# Patient Record
Sex: Male | Born: 1955 | Race: Black or African American | Hispanic: No | Marital: Married | Smoking: Former smoker
Health system: Southern US, Community
[De-identification: ages and names within clinical notes are randomized; demographics above are authoritative.]

## PROBLEM LIST (undated history)

## (undated) DIAGNOSIS — I10 Essential (primary) hypertension: Secondary | ICD-10-CM

## (undated) DIAGNOSIS — K219 Gastro-esophageal reflux disease without esophagitis: Secondary | ICD-10-CM

## (undated) HISTORY — PX: JOINT REPLACEMENT: SHX530

## (undated) HISTORY — PX: TONSILLECTOMY: SUR1361

## (undated) HISTORY — PX: KNEE SURGERY: SHX244

## (undated) HISTORY — PX: HERNIA REPAIR: SHX51

## (undated) HISTORY — PX: TONSILECTOMY, ADENOIDECTOMY, BILATERAL MYRINGOTOMY AND TUBES: SHX2538

---

## 1977-12-02 HISTORY — PX: NECK SURGERY: SHX720

## 2006-12-02 LAB — HM COLONOSCOPY: HM Colonoscopy: NORMAL

## 2007-01-29 ENCOUNTER — Ambulatory Visit: Payer: Self-pay | Admitting: Gastroenterology

## 2008-04-25 ENCOUNTER — Ambulatory Visit: Payer: Self-pay | Admitting: General Practice

## 2008-05-04 ENCOUNTER — Inpatient Hospital Stay: Payer: Self-pay | Admitting: General Practice

## 2008-06-28 ENCOUNTER — Ambulatory Visit: Payer: Self-pay | Admitting: General Practice

## 2008-07-05 ENCOUNTER — Inpatient Hospital Stay: Payer: Self-pay | Admitting: General Practice

## 2014-02-05 ENCOUNTER — Emergency Department: Payer: Self-pay | Admitting: Emergency Medicine

## 2014-07-07 LAB — LIPID PANEL
Cholesterol: 120 mg/dL (ref 0–200)
HDL: 31 mg/dL — AB (ref 35–70)
LDL Cholesterol: 66 mg/dL
Triglycerides: 113 mg/dL (ref 40–160)

## 2014-07-07 LAB — HEMOGLOBIN A1C: Hgb A1c MFr Bld: 6 % (ref 4.0–6.0)

## 2015-03-20 DIAGNOSIS — Z8619 Personal history of other infectious and parasitic diseases: Secondary | ICD-10-CM | POA: Insufficient documentation

## 2015-05-14 ENCOUNTER — Encounter: Payer: Self-pay | Admitting: Family Medicine

## 2015-05-14 DIAGNOSIS — Z6841 Body Mass Index (BMI) 40.0 and over, adult: Secondary | ICD-10-CM

## 2015-05-14 DIAGNOSIS — E785 Hyperlipidemia, unspecified: Secondary | ICD-10-CM | POA: Insufficient documentation

## 2015-05-14 DIAGNOSIS — J302 Other seasonal allergic rhinitis: Secondary | ICD-10-CM | POA: Insufficient documentation

## 2015-05-14 DIAGNOSIS — E8881 Metabolic syndrome: Secondary | ICD-10-CM | POA: Insufficient documentation

## 2015-05-14 DIAGNOSIS — Z96653 Presence of artificial knee joint, bilateral: Secondary | ICD-10-CM | POA: Insufficient documentation

## 2015-05-14 DIAGNOSIS — G4733 Obstructive sleep apnea (adult) (pediatric): Secondary | ICD-10-CM | POA: Insufficient documentation

## 2015-05-14 DIAGNOSIS — I10 Essential (primary) hypertension: Secondary | ICD-10-CM | POA: Insufficient documentation

## 2015-05-14 DIAGNOSIS — N401 Enlarged prostate with lower urinary tract symptoms: Secondary | ICD-10-CM | POA: Insufficient documentation

## 2015-05-14 DIAGNOSIS — N521 Erectile dysfunction due to diseases classified elsewhere: Secondary | ICD-10-CM | POA: Insufficient documentation

## 2015-05-14 DIAGNOSIS — K219 Gastro-esophageal reflux disease without esophagitis: Secondary | ICD-10-CM | POA: Insufficient documentation

## 2015-05-14 DIAGNOSIS — J3089 Other allergic rhinitis: Secondary | ICD-10-CM | POA: Insufficient documentation

## 2015-05-16 ENCOUNTER — Ambulatory Visit (INDEPENDENT_AMBULATORY_CARE_PROVIDER_SITE_OTHER): Payer: Managed Care, Other (non HMO) | Admitting: Family Medicine

## 2015-05-16 ENCOUNTER — Encounter: Payer: Self-pay | Admitting: Family Medicine

## 2015-05-16 VITALS — BP 120/80 | HR 79 | Temp 98.0°F | Resp 16 | Ht 72.0 in | Wt 286.4 lb

## 2015-05-16 DIAGNOSIS — I10 Essential (primary) hypertension: Secondary | ICD-10-CM | POA: Diagnosis not present

## 2015-05-16 DIAGNOSIS — E785 Hyperlipidemia, unspecified: Secondary | ICD-10-CM | POA: Diagnosis not present

## 2015-05-16 DIAGNOSIS — Z8619 Personal history of other infectious and parasitic diseases: Secondary | ICD-10-CM

## 2015-05-16 DIAGNOSIS — F4321 Adjustment disorder with depressed mood: Secondary | ICD-10-CM | POA: Insufficient documentation

## 2015-05-16 DIAGNOSIS — I8391 Asymptomatic varicose veins of right lower extremity: Secondary | ICD-10-CM | POA: Insufficient documentation

## 2015-05-16 MED ORDER — LORAZEPAM 0.5 MG PO TABS: 0.5000 mg | ORAL_TABLET | Freq: Two times a day (BID) | ORAL | Status: DC | PRN

## 2015-05-16 NOTE — Addendum Note (Signed)
Addended by: Cynda Familia on: 05/16/2015 11:18 AM   Modules accepted: Medications

## 2015-05-16 NOTE — Progress Notes (Signed)
Name: Shaun Patrick.   MRN: [id number]    DOB: Jun 24, 1956   Date:05/16/2015       Progress Note  Subjective  Chief Complaint  Chief Complaint  Patient presents with  . Hypertension  . Hyperlipidemia    some leg cramps at night  . Allergic Rhinitis     HPI  HTN: taking medication and denies side effects, bp is at goal. No chest pain , no SOB, no swelling - wears stockings because of varicose vein.  Hyperlipidemia: recent labs done 03/2015 at Urgent Care - HDL was low at 31, LDL at goal 66. Taking Crestor and is doing well.  AR: he can't tolerate nasal steroids, no sneezing, no cough, no sob, no rhinorrhea, occasionally nasal congestion, she has been off medications.    GERD: taking lansoprazole daily and states not as effective as the brand name. He has bloating or heartburn , but usually when he eats late at night or when he splurges on spicy food  Grieving: lost his mother one year ago, and is still grieving. She died 02-09-14. He states he was mama's boy. He also states he lost touch with his sisters because of insurance money.  He was given Lorazepam by Urgent Care but he never started to take it.  ED: he can't afford Stendra anymore, he states medication worked well, but too expensive but he will try to get it.    Patient Active Problem List   Diagnosis Date Noted  . Allergic rhinitis, seasonal 05/14/2015  . Benign hypertension 05/14/2015  . Benign prostatic hypertrophy with lower urinary tract symptoms (LUTS) 05/14/2015  . Dyslipidemia 05/14/2015  . ED (erectile dysfunction) of organic origin 05/14/2015  . Gastro-esophageal reflux disease without esophagitis 05/14/2015  . Dysmetabolic syndrome 05/14/2015  . Extreme obesity 05/14/2015  . Obstructive apnea 05/14/2015  . History of bilateral knee replacement 05/14/2015    Past Surgical History  Procedure Laterality Date  . Knee surgery Bilateral     knee replacements  . Hernia repair      umbilical  . Neck  surgery  1979    broke neck  . Tonsilectomy, adenoidectomy, bilateral myringotomy and tubes      Family History  Problem Relation Age of Onset  . Stroke Mother   . Heart disease Mother   . Hypertension Mother   . Liver disease Father     History   Social History  . Marital Status: Married    Spouse Name: N/A  . Number of Children: N/A  . Years of Education: N/A   Occupational History  . Not on file.   Social History Main Topics  . Smoking status: Former Smoker -- 10 years    Quit date: 12/02/1993  . Smokeless tobacco: Never Used  . Alcohol Use: 0.0 oz/week    0 Standard drinks or equivalent per week     Comment: occasionally  . Drug Use: No  . Sexual Activity: No   Other Topics Concern  . Not on file   Social History Narrative  . No narrative on file     Current outpatient prescriptions:  .  amLODipine-benazepril (LOTREL) 10-40 MG per capsule, Take 1 capsule by mouth daily., Disp: , Rfl:  .  aspirin EC 81 MG tablet, Take 1 tablet by mouth daily., Disp: , Rfl:  .  CRESTOR 20 MG tablet, Take 1 tablet by mouth daily., Disp: , Rfl:  .  lansoprazole (PREVACID) 30 MG capsule, Take 1 capsule by mouth daily.,  Disp: , Rfl:  .  montelukast (SINGULAIR) 10 MG tablet, Take 1 tablet by mouth as needed., Disp: , Rfl:  .  Multiple Vitamin (MULTI-VITAMINS) TABS, Take 1 tablet by mouth 1 day or 1 dose., Disp: , Rfl:  .  Saw Palmetto 160 MG CAPS, Take 1 capsule by mouth 1 day or 1 dose., Disp: , Rfl:   Allergies  Allergen Reactions  . Flomax  [Tamsulosin Hcl]     dizziness     ROS  Constitutional: Negative for fever he lost 8 lbs since last visit.  Respiratory: Negative for cough and shortness of breath.   Cardiovascular: Negative for chest pain or palpitations.  Gastrointestinal: Negative for abdominal pain, no bowel changes.  Musculoskeletal: Negative for gait problem or joint swelling.  Skin: Negative for rash.  Neurological: Negative for dizziness or headache.  No  other specific complaints in a complete review of systems (except as listed in HPI above).  Objective  Filed Vitals:   05/16/15 1029  BP: 120/80  Pulse: 79  Temp: 98 F (36.7 C)  TempSrc: Oral  Resp: 16  Height: 6' (1.829 m)  Weight: 286 lb 6.4 oz (129.91 kg)  SpO2: 98%    Body mass index is 38.83 kg/(m^2).  Physical Exam  Constitutional: Patient appears well-developed and well-nourished. No distress.  HENT: Head: Normocephalic and atraumatic. Ears: B TMs ok, no erythema or effusion; Nose: Nose normal. Mouth/Throat: Oropharynx is clear and moist. No oropharyngeal exudate.  Eyes: Conjunctivae and EOM are normal. Pupils are equal, round, and reactive to light. No scleral icterus.  Neck: Normal range of motion. Neck supple. No JVD present. No thyromegaly present.  Cardiovascular: Normal rate, regular rhythm and normal heart sounds.  No murmur heard. No BLE edema., varicose veins on right lower leg Pulmonary/Chest: Effort normal and breath sounds normal. No respiratory distress. Abdominal: Soft. There is no tenderness. no masses Musculoskeletal: Normal range of motion, no joint effusions. S/p bilateral knee replacement, no effusion Neurological: he is alert and oriented to person, place, and time. No cranial nerve deficit. Coordination, balance, strength, speech and gait are normal.  Skin: Skin is warm and dry. No rash noted. No erythema.  Psychiatric: Patient has a normal mood and affect. behavior is normal. Judgment and thought content normal.  PHQ2/9: Depression screen PHQ 2/9 05/16/2015  Decreased Interest 0  Down, Depressed, Hopeless 0  PHQ - 2 Score 0      Fall Risk: Fall Risk  05/16/2015  Falls in the past year? No      Assessment & Plan   1. Benign hypertension Continue medication  2. History of shingles Hold on shingles vaccine for 2019  3. Varicose veins of right lower extremity Continue compress stocking hoses  4. Dyslipidemia Continue crestor  5.  Dysfunctional grieving He will try prn lorazepam

## 2015-05-16 NOTE — Patient Instructions (Signed)
Lorazepam tablets What is this medicine? LORAZEPAM (lor A ze pam) is a benzodiazepine. It is used to treat anxiety. This medicine may be used for other purposes; ask your health care provider or pharmacist if you have questions. COMMON BRAND NAME(S): Ativan What should I tell my health care provider before I take this medicine? They need to know if you have any of these conditions: -alcohol or drug abuse problem -bipolar disorder, depression, psychosis or other mental health condition -glaucoma -kidney or liver disease -lung disease or breathing difficulties -myasthenia gravis -Parkinson's disease -seizures or a history of seizures -suicidal thoughts -an unusual or allergic reaction to lorazepam, other benzodiazepines, foods, dyes, or preservatives -pregnant or trying to get pregnant -breast-feeding How should I use this medicine? Take this medicine by mouth with a glass of water. Follow the directions on the prescription label. If it upsets your stomach, take it with food or milk. Take your medicine at regular intervals. Do not take it more often than directed. Do not stop taking except on the advice of your doctor or health care professional. Talk to your pediatrician regarding the use of this medicine in children. Special care may be needed. Overdosage: If you think you have taken too much of this medicine contact a poison control center or emergency room at once. NOTE: This medicine is only for you. Do not share this medicine with others. What if I miss a dose? If you miss a dose, take it as soon as you can. If it is almost time for your next dose, take only that dose. Do not take double or extra doses. What may interact with this medicine? -barbiturate medicines for inducing sleep or treating seizures, like phenobarbital -clozapine -medicines for depression, mental problems or psychiatric disturbances -medicines for sleep -phenytoin -probenecid -theophylline -valproic  acid This list may not describe all possible interactions. Give your health care provider a list of all the medicines, herbs, non-prescription drugs, or dietary supplements you use. Also tell them if you smoke, drink alcohol, or use illegal drugs. Some items may interact with your medicine. What should I watch for while using this medicine? Visit your doctor or health care professional for regular checks on your progress. Your body may become dependent on this medicine, ask your doctor or health care professional if you still need to take it. However, if you have been taking this medicine regularly for some time, do not suddenly stop taking it. You must gradually reduce the dose or you may get severe side effects. Ask your doctor or health care professional for advice before increasing or decreasing the dose. Even after you stop taking this medicine it can still affect your body for several days. You may get drowsy or dizzy. Do not drive, use machinery, or do anything that needs mental alertness until you know how this medicine affects you. To reduce the risk of dizzy and fainting spells, do not stand or sit up quickly, especially if you are an older patient. Alcohol may increase dizziness and drowsiness. Avoid alcoholic drinks. Do not treat yourself for coughs, colds or allergies without asking your doctor or health care professional for advice. Some ingredients can increase possible side effects. What side effects may I notice from receiving this medicine? Side effects that you should report to your doctor or health care professional as soon as possible: -changes in vision -confusion -depression -mood changes, excitability or aggressive behavior -movement difficulty, staggering or jerky movements -muscle cramps -restlessness -weakness or tiredness Side effects that   usually do not require medical attention (report to your doctor or health care professional if they continue or are  bothersome): -constipation or diarrhea -difficulty sleeping, nightmares -dizziness, drowsiness -headache -nausea, vomiting This list may not describe all possible side effects. Call your doctor for medical advice about side effects. You may report side effects to FDA at 1-800-FDA-1088. Where should I keep my medicine? Keep out of the reach of children. This medicine can be abused. Keep your medicine in a safe place to protect it from theft. Do not share this medicine with anyone. Selling or giving away this medicine is dangerous and against the law. Store at room temperature between 20 and 25 degrees C (68 and 77 degrees F). Protect from light. Keep container tightly closed. Throw away any unused medicine after the expiration date. NOTE: This sheet is a summary. It may not cover all possible information. If you have questions about this medicine, talk to your doctor, pharmacist, or health care provider.  2015, Elsevier/[employer] Standard. (2008-05-20 14:58:20)  

## 2015-07-23 ENCOUNTER — Other Ambulatory Visit: Payer: Self-pay | Admitting: Family Medicine

## 2015-08-12 ENCOUNTER — Other Ambulatory Visit: Payer: Self-pay | Admitting: Family Medicine

## 2015-08-16 ENCOUNTER — Ambulatory Visit (INDEPENDENT_AMBULATORY_CARE_PROVIDER_SITE_OTHER): Payer: Managed Care, Other (non HMO) | Admitting: Family Medicine

## 2015-08-16 ENCOUNTER — Encounter: Payer: Self-pay | Admitting: Family Medicine

## 2015-08-16 VITALS — BP 110/68 | HR 91 | Temp 98.3°F | Resp 16 | Ht 72.0 in | Wt 284.4 lb

## 2015-08-16 DIAGNOSIS — I10 Essential (primary) hypertension: Secondary | ICD-10-CM | POA: Diagnosis not present

## 2015-08-16 DIAGNOSIS — N529 Male erectile dysfunction, unspecified: Secondary | ICD-10-CM

## 2015-08-16 DIAGNOSIS — E785 Hyperlipidemia, unspecified: Secondary | ICD-10-CM

## 2015-08-16 DIAGNOSIS — J302 Other seasonal allergic rhinitis: Secondary | ICD-10-CM

## 2015-08-16 DIAGNOSIS — J309 Allergic rhinitis, unspecified: Secondary | ICD-10-CM | POA: Diagnosis not present

## 2015-08-16 DIAGNOSIS — N528 Other male erectile dysfunction: Secondary | ICD-10-CM

## 2015-08-16 DIAGNOSIS — E668 Other obesity: Secondary | ICD-10-CM

## 2015-08-16 DIAGNOSIS — Z125 Encounter for screening for malignant neoplasm of prostate: Secondary | ICD-10-CM | POA: Diagnosis not present

## 2015-08-16 DIAGNOSIS — E8881 Metabolic syndrome: Secondary | ICD-10-CM | POA: Diagnosis not present

## 2015-08-16 DIAGNOSIS — K219 Gastro-esophageal reflux disease without esophagitis: Secondary | ICD-10-CM

## 2015-08-16 DIAGNOSIS — Z23 Encounter for immunization: Secondary | ICD-10-CM | POA: Diagnosis not present

## 2015-08-16 DIAGNOSIS — J3089 Other allergic rhinitis: Secondary | ICD-10-CM

## 2015-08-16 DIAGNOSIS — G4733 Obstructive sleep apnea (adult) (pediatric): Secondary | ICD-10-CM | POA: Diagnosis not present

## 2015-08-16 MED ORDER — LEVOCETIRIZINE DIHYDROCHLORIDE 5 MG PO TABS: ORAL_TABLET | ORAL | Status: DC

## 2015-08-16 MED ORDER — FLUTICASONE PROPIONATE 50 MCG/ACT NA SUSP: 2.0000 | Freq: Every day | NASAL | Status: DC

## 2015-08-16 MED ORDER — MONTELUKAST SODIUM 10 MG PO TABS: ORAL_TABLET | ORAL | Status: DC

## 2015-08-16 MED ORDER — OMEPRAZOLE 40 MG PO CPDR: 40.0000 mg | DELAYED_RELEASE_CAPSULE | Freq: Every day | ORAL | Status: DC

## 2015-08-16 MED ORDER — SILDENAFIL CITRATE 20 MG PO TABS: 40.0000 mg | ORAL_TABLET | Freq: Every day | ORAL | Status: DC | PRN

## 2015-08-16 MED ORDER — AMLODIPINE BESY-BENAZEPRIL HCL 10-40 MG PO CAPS: ORAL_CAPSULE | ORAL | Status: DC

## 2015-08-16 MED ORDER — CRESTOR 20 MG PO TABS: 20.0000 mg | ORAL_TABLET | Freq: Every evening | ORAL | Status: DC

## 2015-08-16 NOTE — Progress Notes (Signed)
Name: Shaun Patrick.   MRN: [id number]    DOB: 20-Sep-1956   Date:08/16/2015       Progress Note  Subjective  Chief Complaint  Chief Complaint  Patient presents with  . Medication Refill    3 month F/U  . Hypertension  . Allergic Rhinitis     [place]or[place] with seasonal changes and having URI, Nasal Congestion , Stuffiness, Right Ear Pressure  . Gastrophageal Reflux    Worst, having burning and has to take OTC with prescription medication.  . Hyperlipidemia    Muscle Cramps Occasionally    HPI  HTN: taking bp medication , he states occasionally gets orthostatic, but not drinking enough water. He is not checking bp at home, but advised him to check a few times weekly and if remains below 120/80 he will call us back to decrease dose of medication  AR: used to be seasonal, but over the past few months, has been to Urgent Care four times, had antibiotic prescription once, still using nasal spray, xyzal and singulair, but symptoms are not controlled, having nasal congestion daily. No rhinorrhea. No itchy eyes or nose, but he has sneezing  GERD: he noticed that generic Prevacid is no longer working for him. He has symptoms of GERD daily , described as heartburn , bloating and gurgling in stomach. He would like to try something else.   ED: he used to take other medication, unable to maintain erection, also the medication is too expensive.   Hyperlipidemia: taking medication and states has occasional muscle cramps  Anxiety: occasionally when he misses his mother, but doing better, not taking lorazepam because took it with alcohol once and made him confused.   Patient Active Problem List   Diagnosis Date Noted  . Varicose veins of right lower extremity 05/16/2015  . Dysfunctional grieving 05/16/2015  . Perennial allergic rhinitis with seasonal variation 05/14/2015  . Benign hypertension 05/14/2015  . Benign prostatic hypertrophy with lower urinary tract symptoms (LUTS) 05/14/2015  .  Dyslipidemia 05/14/2015  . ED (erectile dysfunction) of organic origin 05/14/2015  . Gastro-esophageal reflux disease without esophagitis 05/14/2015  . Dysmetabolic syndrome 05/14/2015  . Extreme obesity 05/14/2015  . Obstructive apnea 05/14/2015  . History of bilateral knee replacement 05/14/2015  . History of shingles 03/20/2015    Past Surgical History  Procedure Laterality Date  . Knee surgery Bilateral     knee replacements  . Hernia repair      umbilical  . Neck surgery  1979    broke neck  . Tonsilectomy, adenoidectomy, bilateral myringotomy and tubes      Family History  Problem Relation Age of Onset  . Stroke Mother   . Heart disease Mother   . Hypertension Mother   . Liver disease Father   . Hypertension Sister     Social History   Social History  . Marital Status: Married    Spouse Name: N/A  . Number of Children: N/A  . Years of Education: N/A   Occupational History  . Not on file.   Social History Main Topics  . Smoking status: Former Smoker -- 10 years    Types: Cigarettes    Start date: 12/03/1983    Quit date: 12/02/1993  . Smokeless tobacco: Never Used  . Alcohol Use: 0.0 oz/week    0 Standard drinks or equivalent per week     Comment: occasionally  . Drug Use: No  . Sexual Activity: No   Other Topics Concern  . Not  on file   Social History Narrative     Current outpatient prescriptions:  .  amLODipine-benazepril (LOTREL) 10-40 MG per capsule, One daily for bp, Disp: 30 capsule, Rfl: 3 .  aspirin EC 81 MG tablet, Take 1 tablet by mouth daily., Disp: , Rfl:  .  CRESTOR 20 MG tablet, Take 1 tablet (20 mg total) by mouth every evening., Disp: 30 tablet, Rfl: 3 .  fluticasone (FLONASE) 50 MCG/ACT nasal spray, Place 2 sprays into both nostrils daily., Disp: 16 g, Rfl: 3 .  levocetirizine (XYZAL) 5 MG tablet, TAKE ONE TABLET BY MOUTH ONCE DAILY FOR  ALLLERGIES, Disp: 30 tablet, Rfl: 3 .  LORazepam (ATIVAN) 0.5 MG tablet, Take 1 tablet (0.5  mg total) by mouth 2 (two) times daily as needed for anxiety., Disp: 30 tablet, Rfl: 0 .  meclizine (ANTIVERT) 25 MG tablet, , Disp: , Rfl:  .  montelukast (SINGULAIR) 10 MG tablet, One pill by mouth daily, Disp: 30 tablet, Rfl: 3 .  Multiple Vitamin (MULTI-VITAMINS) TABS, Take 1 tablet by mouth 1 day or 1 dose., Disp: , Rfl:  .  omeprazole (PRILOSEC) 40 MG capsule, Take 1 capsule (40 mg total) by mouth daily., Disp: 30 capsule, Rfl: 3 .  Saw Palmetto 160 MG CAPS, Take 1 capsule by mouth 1 day or 1 dose., Disp: , Rfl:  .  sildenafil (REVATIO) 20 MG tablet, Take 2 tablets (40 mg total) by mouth daily as needed (ED)., Disp: 40 tablet, Rfl: 2  Allergies  Allergen Reactions  . Flomax  [Tamsulosin Hcl]     dizziness  . Hydrocodone Anxiety     ROS  Constitutional: Negative for fever or significant weight change.  Respiratory: Negative for cough and shortness of breath.   Cardiovascular: Negative for chest pain or palpitations.  Gastrointestinal: Negative for abdominal pain, no bowel changes.  Musculoskeletal: Negative for gait problem or joint swelling.  Skin: Negative for rash.  Neurological: Occasional dizziness or headache.  No other specific complaints in a complete review of systems (except as listed in HPI above).   Objective  Filed Vitals:   08/16/15 1027  BP: 110/68  Pulse: 91  Temp: 98.3 F (36.8 C)  TempSrc: Oral  Resp: 16  Height: 6' (1.829 m)  Weight: 284 lb 6.4 oz (129.003 kg)  SpO2: 97%    Body mass index is 38.56 kg/(m^2).  Physical Exam  Constitutional: Patient appears well-developed and well-nourished. Obese  No distress.  HEENT: head atraumatic, normocephalic, pupils equal and reactive to light, ears TM, neck supple, throat within normal limits - s/p uvuloplasty, mild erythema, boggy turbinates Cardiovascular: Normal rate, regular rhythm and normal heart sounds.  No murmur heard. No BLE edema. Pulmonary/Chest: Effort normal and breath sounds normal. No  respiratory distress. Abdominal: Soft.  There is no tenderness. Psychiatric: Patient has a normal mood and affect. behavior is normal. Judgment and thought content normal.  PHQ2/9: Depression screen [facility] 2/9 08/16/2015 05/16/2015  Decreased Interest 0 0  Down, Depressed, Hopeless 0 0  PHQ - 2 Score 0 0    Fall Risk: Fall Risk  08/16/2015 05/16/2015  Falls in the past year? No No      Functional Status Survey: Is the patient deaf or have difficulty hearing?: No Does the patient have difficulty seeing, even when wearing glasses/contacts?: Yes (glasses) Does the patient have difficulty concentrating, remembering, or making decisions?: No Does the patient have difficulty walking or climbing stairs?: No Does the patient have difficulty dressing or bathing?: No  Does the patient have difficulty doing errands alone such as visiting a doctor's office or shopping?: No    Assessment & Plan  1. Gastro-esophageal reflux disease without esophagitis Change to Omeprazole and if no improvement refer to GI, explained importance of avoiding caffeine, not to eat before going to bed - omeprazole (PRILOSEC) 40 MG capsule; Take 1 capsule (40 mg total) by mouth daily.  Dispense: 30 capsule; Refill: 3  2. Needs flu shot  - Flu Vaccine QUAD 36+ mos PF IM (Fluarix & Fluzone Quad PF)  3. Obstructive apnea Had uvuloplasty  4. Extreme obesity Discussed diet and exercise  5. Dysmetabolic syndrome  - Hemoglobin A1c  7. ED  - sildenafil (REVATIO) 20 MG tablet; Take 2 tablets (40 mg total) by mouth daily as needed (ED).  Dispense: 40 tablet; Refill: 2 Discussed not FDA approved for ED, can drop his blood pressure, also discussed possible side effects  7. Dyslipidemia  - Lipid panel - CRESTOR 20 MG tablet; Take 1 tablet (20 mg total) by mouth every evening.  Dispense: 30 tablet; Refill: 3  8. Benign hypertension Is low, he will monitor at home, I was going to decrease dose but he wants to hold off  for now - Comprehensive metabolic panel - amLODipine-benazepril (LOTREL) 10-40 MG per capsule; One daily for bp  Dispense: 30 capsule; Refill: 3  9. Perennial allergic rhinitis with seasonal variation  - Ambulatory referral to ENT - levocetirizine (XYZAL) 5 MG tablet; TAKE ONE TABLET BY MOUTH ONCE DAILY FOR  ALLLERGIES  Dispense: 30 tablet; Refill: 3 - montelukast (SINGULAIR) 10 MG tablet; One pill by mouth daily  Dispense: 30 tablet; Refill: 3 - fluticasone (FLONASE) 50 MCG/ACT nasal spray; Place 2 sprays into both nostrils daily.  Dispense: 16 g; Refill: 3  10. Prostate cancer screening  - PSA

## 2015-08-17 LAB — COMPREHENSIVE METABOLIC PANEL
ALT: 33 IU/L (ref 0–44)
AST: 28 IU/L (ref 0–40)
Albumin/Globulin Ratio: 1.4 (ref 1.1–2.5)
Albumin: 4.3 g/dL (ref 3.5–5.5)
Alkaline Phosphatase: 90 IU/L (ref 39–117)
BUN/Creatinine Ratio: 16 (ref 9–20)
BUN: 15 mg/dL (ref 6–24)
Bilirubin Total: 0.3 mg/dL (ref 0.0–1.2)
CO2: 25 mmol/L (ref 18–29)
Calcium: 9.2 mg/dL (ref 8.7–10.2)
Chloride: 99 mmol/L (ref 97–108)
Creatinine, Ser: 0.95 mg/dL (ref 0.76–1.27)
GFR calc Af Amer: 102 mL/min/{1.73_m2} (ref >59)
GFR calc non Af Amer: 88 mL/min/{1.73_m2} (ref >59)
Globulin, Total: 3 g/dL (ref 1.5–4.5)
Glucose: 98 mg/dL (ref 65–99)
Potassium: 4.5 mmol/L (ref 3.5–5.2)
Sodium: 140 mmol/L (ref 134–144)
Total Protein: 7.3 g/dL (ref 6.0–8.5)

## 2015-08-17 LAB — PSA: Prostate Specific Ag, Serum: 2.9 ng/mL (ref 0.0–4.0)

## 2015-08-17 LAB — LIPID PANEL
Chol/HDL Ratio: 3.9 ratio units (ref 0.0–5.0)
Cholesterol, Total: 156 mg/dL (ref 100–199)
HDL: 40 mg/dL (ref >39)
LDL Calculated: 88 mg/dL (ref 0–99)
Triglycerides: 140 mg/dL (ref 0–149)
VLDL Cholesterol Cal: 28 mg/dL (ref 5–40)

## 2015-08-17 LAB — HEMOGLOBIN A1C
Est. average glucose Bld gHb Est-mCnc: 126 mg/dL
Hgb A1c MFr Bld: 6 % — ABNORMAL HIGH (ref 4.8–5.6)

## 2015-08-18 ENCOUNTER — Telehealth: Payer: Self-pay

## 2015-08-18 NOTE — Progress Notes (Signed)
Patient notified

## 2015-08-18 NOTE — Telephone Encounter (Signed)
-----   Message from Alba Cory, MD sent at 08/17/2015  8:51 PM EDT ----- Lipid panel has improved, HDL is much better Sugar , kidney and liver functions are within normal limits HgbA1C is stable, pre diabetes Normal PSA

## 2015-08-18 NOTE — Telephone Encounter (Signed)
Patient was not home, left a message for patient to return my call for lab results.

## 2015-09-18 ENCOUNTER — Telehealth: Payer: Self-pay | Admitting: Family Medicine

## 2015-09-18 ENCOUNTER — Other Ambulatory Visit: Payer: Self-pay

## 2015-09-18 NOTE — Telephone Encounter (Signed)
Patient stated that due to his job is pharmacy has changed to the [facility] on [address] in [place].

## 2015-09-18 NOTE — Telephone Encounter (Signed)
Changed his pharmacy in his chart.

## 2015-10-03 ENCOUNTER — Encounter
Admission: RE | Admit: 2015-10-03 | Discharge: 2015-10-03 | Disposition: A | Discharge status: Home / Self Care | Payer: Managed Care, Other (non HMO) | Source: Ambulatory Visit | Attending: Unknown Physician Specialty | Admitting: Unknown Physician Specialty

## 2015-10-03 DIAGNOSIS — Z01818 Encounter for other preprocedural examination: Secondary | ICD-10-CM | POA: Insufficient documentation

## 2015-10-03 HISTORY — DX: Essential (primary) hypertension: I10

## 2015-10-03 HISTORY — DX: Gastro-esophageal reflux disease without esophagitis: K21.9

## 2015-10-03 LAB — CBC
HCT: 45.8 % (ref 40.0–52.0)
Hemoglobin: 14.7 g/dL (ref 13.0–18.0)
MCH: 27.3 pg (ref 26.0–34.0)
MCHC: 32.2 g/dL (ref 32.0–36.0)
MCV: 84.8 fL (ref 80.0–100.0)
Platelets: 224 10*3/uL (ref 150–440)
RBC: 5.4 MIL/uL (ref 4.40–5.90)
RDW: 15.3 % — ABNORMAL HIGH (ref 11.5–14.5)
WBC: 8.6 10*3/uL (ref 3.8–10.6)

## 2015-10-03 NOTE — Patient Instructions (Signed)
  Your procedure is scheduled on: Tuesday 10/17/2015 Report to Day Surgery. 2ND FLOOR MEDICAL MALL ENTRANCE To find out your arrival time please call [phone] between 1PM - 3PM on Monday 10/16/2015.  Remember: Instructions that are not followed completely may result in serious medical risk, up to and including death, or upon the discretion of your surgeon and anesthesiologist your surgery may need to be rescheduled.    __X__ 1. Do not eat food or drink liquids after midnight. No gum chewing or hard candies.     __X__ 2. No Alcohol for 24 hours before or after surgery.   ____ 3. Bring all medications with you on the day of surgery if instructed.    __X__ 4. Notify your doctor if there is any change in your medical condition     (cold, fever, infections).     Do not wear jewelry, make-up, hairpins, clips or nail polish.  Do not wear lotions, powders, or perfumes. You may wear deodorant.  Do not shave 48 hours prior to surgery. Men may shave face and neck.  Do not bring valuables to the hospital.    [facility] is not responsible for any belongings or valuables.               Contacts, dentures or bridgework may not be worn into surgery.  Leave your suitcase in the car. After surgery it may be brought to your room.  For patients admitted to the hospital, discharge time is determined by your                treatment team.   Patients discharged the day of surgery will not be allowed to drive home.   Please read over the following fact sheets that you were given:   Surgical Site Infection Prevention   __X__ Take these medicines the morning of surgery with A SIP OF WATER:    1. AMLODIPINE  2. CRESTOR  3. SINGULAIR  4. OMEPRAZOLE  5.   6.  ____ Fleet Enema (as directed)   ____ Use CHG Soap as directed  ____ Use inhalers on the day of surgery  ____ Stop metformin 2 days prior to surgery    ____ Take 1/2 of usual insulin dose the night before surgery and none on the morning  of surgery.   __X__ Stop Coumadin/Plavix/aspirin on 10/10/2015  ____ Stop Anti-inflammatories on    __X__ Stop supplements until after surgery.  STOP SAW PALMETTO 7-10 DAYS PRIOR TO SURGERY  ____ Bring C-Pap to the hospital.

## 2015-10-17 ENCOUNTER — Ambulatory Visit
Admission: RE | Admit: 2015-10-17 | Discharge: 2015-10-17 | Disposition: A | Discharge status: Home / Self Care | Payer: Managed Care, Other (non HMO) | Source: Ambulatory Visit | Attending: Unknown Physician Specialty | Admitting: Unknown Physician Specialty

## 2015-10-17 ENCOUNTER — Encounter
Admission: RE | Disposition: A | Discharge status: Home / Self Care | Payer: Self-pay | Source: Ambulatory Visit | Attending: Unknown Physician Specialty

## 2015-10-17 ENCOUNTER — Encounter: Payer: Self-pay | Admitting: *Deleted

## 2015-10-17 ENCOUNTER — Ambulatory Visit: Payer: Managed Care, Other (non HMO) | Admitting: Anesthesiology

## 2015-10-17 DIAGNOSIS — Z87891 Personal history of nicotine dependence: Secondary | ICD-10-CM | POA: Diagnosis not present

## 2015-10-17 DIAGNOSIS — I10 Essential (primary) hypertension: Secondary | ICD-10-CM | POA: Diagnosis not present

## 2015-10-17 DIAGNOSIS — J343 Hypertrophy of nasal turbinates: Secondary | ICD-10-CM | POA: Diagnosis not present

## 2015-10-17 DIAGNOSIS — Z79899 Other long term (current) drug therapy: Secondary | ICD-10-CM | POA: Insufficient documentation

## 2015-10-17 DIAGNOSIS — Z96659 Presence of unspecified artificial knee joint: Secondary | ICD-10-CM | POA: Insufficient documentation

## 2015-10-17 DIAGNOSIS — I739 Peripheral vascular disease, unspecified: Secondary | ICD-10-CM | POA: Insufficient documentation

## 2015-10-17 DIAGNOSIS — Z888 Allergy status to other drugs, medicaments and biological substances status: Secondary | ICD-10-CM | POA: Diagnosis not present

## 2015-10-17 DIAGNOSIS — Z885 Allergy status to narcotic agent status: Secondary | ICD-10-CM | POA: Diagnosis not present

## 2015-10-17 DIAGNOSIS — J342 Deviated nasal septum: Secondary | ICD-10-CM | POA: Insufficient documentation

## 2015-10-17 DIAGNOSIS — G4733 Obstructive sleep apnea (adult) (pediatric): Secondary | ICD-10-CM | POA: Insufficient documentation

## 2015-10-17 DIAGNOSIS — K219 Gastro-esophageal reflux disease without esophagitis: Secondary | ICD-10-CM | POA: Insufficient documentation

## 2015-10-17 DIAGNOSIS — Z8379 Family history of other diseases of the digestive system: Secondary | ICD-10-CM | POA: Insufficient documentation

## 2015-10-17 DIAGNOSIS — E785 Hyperlipidemia, unspecified: Secondary | ICD-10-CM | POA: Diagnosis not present

## 2015-10-17 DIAGNOSIS — J3489 Other specified disorders of nose and nasal sinuses: Secondary | ICD-10-CM | POA: Insufficient documentation

## 2015-10-17 DIAGNOSIS — Z823 Family history of stroke: Secondary | ICD-10-CM | POA: Insufficient documentation

## 2015-10-17 HISTORY — PX: NASAL SEPTOPLASTY W/ TURBINOPLASTY: SHX2070

## 2015-10-17 SURGERY — SEPTOPLASTY, NOSE, WITH NASAL TURBINATE REDUCTION
Anesthesia: General | Laterality: Bilateral | Wound class: Clean Contaminated

## 2015-10-17 MED ORDER — BACITRACIN ZINC 500 UNIT/GM EX OINT
TOPICAL_OINTMENT | CUTANEOUS | Status: AC
  Filled 2015-10-17: qty 28.35

## 2015-10-17 MED ORDER — PHENYLEPHRINE HCL 10 % OP SOLN
Freq: Once | OPHTHALMIC | Status: DC
  Filled 2015-10-17: qty 10

## 2015-10-17 MED ORDER — LIDOCAINE-EPINEPHRINE 1 %-1:100000 IJ SOLN
INTRAMUSCULAR | Status: DC | PRN
  Administered 2015-10-17: 10 mL

## 2015-10-17 MED ORDER — FENTANYL CITRATE (PF) 100 MCG/2ML IJ SOLN
INTRAMUSCULAR | Status: DC | PRN
  Administered 2015-10-17: 100 ug via INTRAVENOUS

## 2015-10-17 MED ORDER — ONDANSETRON HCL 4 MG/2ML IJ SOLN: 4.0000 mg | Freq: Once | INTRAMUSCULAR | Status: DC | PRN

## 2015-10-17 MED ORDER — LIDOCAINE HCL (CARDIAC) 20 MG/ML IV SOLN
INTRAVENOUS | Status: DC | PRN
  Administered 2015-10-17: 100 mg via INTRAVENOUS

## 2015-10-17 MED ORDER — BACITRACIN 50000 UNITS IM SOLR
INTRAMUSCULAR | Status: AC
  Filled 2015-10-17: qty 1

## 2015-10-17 MED ORDER — OXYCODONE-ACETAMINOPHEN 7.5-325 MG PO TABS
1.0000 | ORAL_TABLET | ORAL | Status: DC | PRN
  Administered 2015-10-17: 1 via ORAL

## 2015-10-17 MED ORDER — OXYMETAZOLINE HCL 0.05 % NA SOLN: 6.0000 | Freq: Once | NASAL | Status: DC

## 2015-10-17 MED ORDER — PHENYLEPHRINE HCL 10 MG/ML IJ SOLN
INTRAMUSCULAR | Status: DC | PRN
  Administered 2015-10-17 (×4): 100 ug via INTRAVENOUS
  Administered 2015-10-17: 50 ug via INTRAVENOUS

## 2015-10-17 MED ORDER — PROPOFOL 10 MG/ML IV BOLUS
INTRAVENOUS | Status: DC | PRN
  Administered 2015-10-17: 200 mg via INTRAVENOUS

## 2015-10-17 MED ORDER — SUGAMMADEX SODIUM 500 MG/5ML IV SOLN
INTRAVENOUS | Status: DC | PRN
  Administered 2015-10-17: 256.8 mg via INTRAVENOUS

## 2015-10-17 MED ORDER — SUCCINYLCHOLINE CHLORIDE 20 MG/ML IJ SOLN
INTRAMUSCULAR | Status: DC | PRN
  Administered 2015-10-17: 140 mg via INTRAVENOUS

## 2015-10-17 MED ORDER — OXYCODONE-ACETAMINOPHEN 7.5-325 MG PO TABS
ORAL_TABLET | ORAL | Status: AC
  Administered 2015-10-17: 1 via ORAL
  Filled 2015-10-17: qty 1

## 2015-10-17 MED ORDER — SULFAMETHOXAZOLE-TRIMETHOPRIM 400-80 MG PO TABS: 1.0000 | ORAL_TABLET | Freq: Two times a day (BID) | ORAL | Status: DC

## 2015-10-17 MED ORDER — FENTANYL CITRATE (PF) 100 MCG/2ML IJ SOLN
25.0000 ug | INTRAMUSCULAR | Status: DC | PRN
  Administered 2015-10-17 (×4): 25 ug via INTRAVENOUS

## 2015-10-17 MED ORDER — LIDOCAINE-EPINEPHRINE 1 %-1:100000 IJ SOLN
INTRAMUSCULAR | Status: AC
  Filled 2015-10-17: qty 1

## 2015-10-17 MED ORDER — ONDANSETRON HCL 4 MG/2ML IJ SOLN
INTRAMUSCULAR | Status: DC | PRN
  Administered 2015-10-17: 4 mg via INTRAVENOUS

## 2015-10-17 MED ORDER — EPHEDRINE SULFATE 50 MG/ML IJ SOLN
INTRAMUSCULAR | Status: DC | PRN
  Administered 2015-10-17 (×4): 5 mg via INTRAVENOUS

## 2015-10-17 MED ORDER — OXYCODONE-ACETAMINOPHEN 7.5-325 MG PO TABS: 1.0000 | ORAL_TABLET | ORAL | Status: DC | PRN

## 2015-10-17 MED ORDER — OXYMETAZOLINE HCL 0.05 % NA SOLN
NASAL | Status: AC
  Filled 2015-10-17: qty 15

## 2015-10-17 MED ORDER — LACTATED RINGERS IV SOLN
INTRAVENOUS | Status: DC
  Administered 2015-10-17: 75 mL/h via INTRAVENOUS

## 2015-10-17 MED ORDER — ROCURONIUM BROMIDE 100 MG/10ML IV SOLN
INTRAVENOUS | Status: DC | PRN
  Administered 2015-10-17: 20 mg via INTRAVENOUS
  Administered 2015-10-17: 10 mg via INTRAVENOUS

## 2015-10-17 MED ORDER — MIDAZOLAM HCL 2 MG/2ML IJ SOLN
INTRAMUSCULAR | Status: DC | PRN
  Administered 2015-10-17: 2 mg via INTRAVENOUS

## 2015-10-17 MED ORDER — DEXAMETHASONE SODIUM PHOSPHATE 4 MG/ML IJ SOLN
INTRAMUSCULAR | Status: DC | PRN
  Administered 2015-10-17: 8 mg via INTRAVENOUS

## 2015-10-17 MED ORDER — FENTANYL CITRATE (PF) 100 MCG/2ML IJ SOLN
INTRAMUSCULAR | Status: AC
  Administered 2015-10-17: 25 ug via INTRAVENOUS
  Filled 2015-10-17: qty 2

## 2015-10-17 SURGICAL SUPPLY — 24 items
BANDAGE EYE OVAL (MISCELLANEOUS) IMPLANT
BLADE SURG 15 STRL LF DISP TIS (BLADE) ×1 IMPLANT
BLADE SURG 15 STRL SS (BLADE) ×2
CANISTER SUCT 1200ML W/VALVE (MISCELLANEOUS) ×3 IMPLANT
COAG SUCT 10F 3.5MM HAND CTRL (MISCELLANEOUS) ×3 IMPLANT
DRESSING NASL FOAM PST OP SINU (MISCELLANEOUS) ×2 IMPLANT
DRSG NASAL FOAM POST OP SINU (MISCELLANEOUS) ×6
GLOVE BIO SURGEON STRL SZ7.5 (GLOVE) ×6 IMPLANT
GOWN STRL REUS W/ TWL LRG LVL3 (GOWN DISPOSABLE) ×2 IMPLANT
GOWN STRL REUS W/TWL LRG LVL3 (GOWN DISPOSABLE) ×4
LABEL OR SOLS (LABEL) IMPLANT
NS IRRIG 500ML POUR BTL (IV SOLUTION) ×3 IMPLANT
PACK HEAD/NECK (MISCELLANEOUS) ×3 IMPLANT
PAD GROUND ADULT SPLIT (MISCELLANEOUS) ×3 IMPLANT
SPLINT NASAL REUTER .5MM (MISCELLANEOUS) ×3 IMPLANT
SPOGE SURGIFLO 8M (HEMOSTASIS)
SPONGE NEURO XRAY DETECT 1X3 (DISPOSABLE) ×3 IMPLANT
SPONGE SURGIFLO 8M (HEMOSTASIS) IMPLANT
SUT CHROMIC 3-0 (SUTURE) ×2
SUT CHROMIC 3-0 KS 27XMFL CR (SUTURE) ×1
SUT ETHILON 3-0 KS 30 BLK (SUTURE) ×3 IMPLANT
SUT PLAIN GUT 4-0 (SUTURE) ×3 IMPLANT
SUTURE CHRMC 3-0 KS 27XMFL CR (SUTURE) ×1 IMPLANT
WATER STERILE IRR 1000ML POUR (IV SOLUTION) ×3 IMPLANT

## 2015-10-17 NOTE — H&P (Signed)
  H+P  Reviewed and will be scanned in later. No changes noted. 

## 2015-10-17 NOTE — Discharge Instructions (Signed)
AMBULATORY SURGERY  DISCHARGE INSTRUCTIONS   1) The drugs that you were given will stay in your system until tomorrow so for the next 24 hours you should not:  A) Drive an automobile B) Make any legal decisions C) Drink any alcoholic beverage   2) You may resume regular meals tomorrow.  Today it is better to start with liquids and gradually work up to solid foods.  You may eat anything you prefer, but it is better to start with liquids, then soup and crackers, and gradually work up to solid foods.   3) Please notify your doctor immediately if you have any unusual bleeding, trouble breathing, redness and pain at the surgery site, drainage, fever, or pain not relieved by medication.    4) Additional Instructions:        Please contact your physician with any problems or Same Day Surgery at [phone], Monday through Friday 6 am to 4 pm, or [place] [facility] Main number at [phone].  No nose blowing, bending over or heavy lifting.

## 2015-10-17 NOTE — Anesthesia Postprocedure Evaluation (Signed)
  Anesthesia Post-op Note  Patient: Shaun AdamLuther Bencosme Jr.  Procedure(s) Performed: Procedure(s): NASAL SEPTOPLASTY WITH TURBINATE REDUCTION (Bilateral)  Anesthesia type:General  Patient location: PACU  Post pain: Pain level controlled  Post assessment: Post-op Vital signs reviewed, Patient's Cardiovascular Status Stable, Respiratory Function Stable, Patent Airway and No signs of Nausea or vomiting  Post vital signs: Reviewed and stable  Last Vitals:  Filed Vitals:   10/17/15 1037  BP: 128/65  Pulse: 90  Temp:   Resp: 16    Level of consciousness: awake, alert  and patient cooperative  Complications: No apparent anesthesia complications

## 2015-10-17 NOTE — Anesthesia Preprocedure Evaluation (Signed)
Anesthesia Evaluation  Patient identified by MRN, date of birth, ID band Patient awake    Reviewed: Allergy & Precautions, NPO status , Patient's Chart, lab work & pertinent test results  History of Anesthesia Complications Negative for: history of anesthetic complications  Airway Mallampati: III       Dental  (+) Teeth Intact   Pulmonary neg pulmonary ROS, sleep apnea (remote hx, sp UPPP) , former smoker,           Cardiovascular hypertension, Pt. on medications + Peripheral Vascular Disease       Neuro/Psych    GI/Hepatic Neg liver ROS, GERD  Medicated and Poorly Controlled,  Endo/Other  negative endocrine ROS  Renal/GU negative Renal ROS     Musculoskeletal   Abdominal   Peds  Hematology   Anesthesia Other Findings   Reproductive/Obstetrics                             Anesthesia Physical Anesthesia Plan  ASA: II  Anesthesia Plan: General   Post-op Pain Management:    Induction: Intravenous  Airway Management Planned: Oral ETT  Additional Equipment:   Intra-op Plan:   Post-operative Plan:   Informed Consent: I have reviewed the patients History and Physical, chart, labs and discussed the procedure including the risks, benefits and alternatives for the proposed anesthesia with the patient or authorized representative who has indicated his/her understanding and acceptance.     Plan Discussed with:   Anesthesia Plan Comments:         Anesthesia Quick Evaluation

## 2015-10-17 NOTE — Op Note (Signed)
PREOPERATIVE DIAGNOSIS:  Chronic nasal obstruction.  POSTOPERATIVE DIAGNOSIS:  Chronic nasal obstruction.  SURGEON:  Davina Pokehapman T. Aidyn Kellis, M.D.  NAME OF PROCEDURE:  1. Nasal septoplasty. 2. Submucous resection of inferior turbinates.  OPERATIVE FINDINGS:  Severe nasal septal deformity, hypertrophy of the inferior turbinates.   DESCRIPTION OF THE PROCEDURE:  Shaun Patrick. was identified in the holding area and taken to the operating room and placed in the supine position.  After general endotracheal anesthesia was induced, the table was turned 45 degrees and the patient was placed in a semi-Fowler position.  The nose was then topically anesthetized with Lidocaine, cotton pledgets were placed within each nostril. After approximately 5 minutes, this was removed at which time a local anesthetic of 1% Lidocaine 1:100,000 units of Epinephrine was used to inject the inferior turbinates in the nasal septum. A total of 10 ml was used. Examination of the nose showed a severe left nasal septal deformity and tremendous hypertrophied inferior turbinate.  Beginning on the right hand side a hemitransfixion incision was then created on the leading edge of the septum on the right.  A subperichondrial plane was elevated posteriorly on the left and taken back to the perpendicular plate of the ethmoid where subperiosteal plane was elevated posteriorly on the left. A large septal spur was identified on the left hand side impacting on the inferior turbinate.  An inferior rim of cartilage was removed anteriorly with care taken to leave an anterior strut to prevent nasal collapse. With this strut removed the perpendicular plate of the ethmoid was separated from the quadrangular cartilage. The large septal spur was removed.  The septum was then replaced in the midline. Reinspection through each nostril showed excellent reduction of the septal deformity. A left posterior inferior fenestration was then created to allow hematoma  drainage.  With the septoplasty completed, beginning on the left-hand side, a 15 blade was used to incise along the inferior edge of the inferior turbinate. A superior laterally based flap was then elevated. The underlying conchal bone of mucosa was excised using Knight scissors. The flap was then laid back over the turbinate stump and cauterized using suction cautery. In a similar fashion the submucous resection was performed on the right.  With the submucous resection completed bilaterally and no active bleeding, the hemitransfixion incision was then closed using two interrupted 3-0 chromic sutures.  Plastic nasal septal splints were placed within each nostril and affixed to the septum using a 3-0 nylon suture. Stammberger was then used beneath each inferior turbinate for hemostasis.    The patient tolerated the procedure well, was returned to anesthesia, extubated in the operating room, and taken to the recovery room in stable condition.    CULTURES:  None.  SPECIMENS:  None.  ESTIMATED BLOOD LOSS:  25 cc.  Mohsin Crum T  10/17/2015  8:05 AM

## 2015-10-17 NOTE — Transfer of Care (Signed)
Immediate Anesthesia Transfer of Care Note  Patient: Shaun AdamLuther Brecheisen Jr.  Procedure(s) Performed: Procedure(s): NASAL SEPTOPLASTY WITH TURBINATE REDUCTION (Bilateral)  Patient Location: PACU  Anesthesia Type:General  Level of Consciousness: awake, alert  and oriented  Airway & Oxygen Therapy: Patient Spontanous Breathing and Patient connected to face mask oxygen  Post-op Assessment: Report given to RN and Post -op Vital signs reviewed and stable  Post vital signs: Reviewed and stable  Last Vitals:  Filed Vitals:   10/17/15 0815  BP: 150/77  Pulse: 96  Temp: 37.7 C  Resp: 18    Complications: No apparent anesthesia complications

## 2015-11-20 ENCOUNTER — Other Ambulatory Visit: Payer: Self-pay

## 2015-11-20 MED ORDER — ROSUVASTATIN CALCIUM 20 MG PO TABS: 20.0000 mg | ORAL_TABLET | Freq: Every day | ORAL | Status: DC

## 2015-11-20 NOTE — Telephone Encounter (Signed)
[facility] will not cover Brand, and sent a paper with all the generics that will be covered.

## 2015-12-18 ENCOUNTER — Ambulatory Visit: Payer: Managed Care, Other (non HMO) | Admitting: Family Medicine

## 2015-12-29 ENCOUNTER — Other Ambulatory Visit: Payer: Self-pay | Admitting: Family Medicine

## 2015-12-29 NOTE — Telephone Encounter (Signed)
Patient requesting refill. 

## 2016-01-01 NOTE — Telephone Encounter (Signed)
Appt made for 01-11-16 and patient informed of prescriptions being at the pharmacy

## 2016-01-11 ENCOUNTER — Ambulatory Visit (INDEPENDENT_AMBULATORY_CARE_PROVIDER_SITE_OTHER): Payer: Managed Care, Other (non HMO) | Admitting: Family Medicine

## 2016-01-11 ENCOUNTER — Encounter: Payer: Self-pay | Admitting: Family Medicine

## 2016-01-11 VITALS — BP 122/60 | HR 84 | Temp 98.1°F | Resp 16 | Ht 72.0 in | Wt 293.3 lb

## 2016-01-11 DIAGNOSIS — Z9889 Other specified postprocedural states: Secondary | ICD-10-CM

## 2016-01-11 DIAGNOSIS — K219 Gastro-esophageal reflux disease without esophagitis: Secondary | ICD-10-CM

## 2016-01-11 DIAGNOSIS — G4733 Obstructive sleep apnea (adult) (pediatric): Secondary | ICD-10-CM

## 2016-01-11 DIAGNOSIS — J309 Allergic rhinitis, unspecified: Secondary | ICD-10-CM | POA: Diagnosis not present

## 2016-01-11 DIAGNOSIS — J302 Other seasonal allergic rhinitis: Secondary | ICD-10-CM

## 2016-01-11 DIAGNOSIS — I10 Essential (primary) hypertension: Secondary | ICD-10-CM

## 2016-01-11 DIAGNOSIS — E785 Hyperlipidemia, unspecified: Secondary | ICD-10-CM

## 2016-01-11 DIAGNOSIS — J3089 Other allergic rhinitis: Secondary | ICD-10-CM

## 2016-01-11 MED ORDER — MONTELUKAST SODIUM 10 MG PO TABS: ORAL_TABLET | ORAL | Status: DC

## 2016-01-11 MED ORDER — LEVOCETIRIZINE DIHYDROCHLORIDE 5 MG PO TABS: 5.0000 mg | ORAL_TABLET | Freq: Every evening | ORAL | Status: DC

## 2016-01-11 MED ORDER — OMEPRAZOLE 40 MG PO CPDR: 40.0000 mg | DELAYED_RELEASE_CAPSULE | Freq: Every day | ORAL | Status: DC

## 2016-01-11 MED ORDER — AMLODIPINE BESY-BENAZEPRIL HCL 10-40 MG PO CAPS: 1.0000 | ORAL_CAPSULE | Freq: Every day | ORAL | Status: DC

## 2016-01-11 MED ORDER — FLUTICASONE PROPIONATE 50 MCG/ACT NA SUSP: 2.0000 | Freq: Every day | NASAL | Status: DC

## 2016-01-11 NOTE — Progress Notes (Signed)
Name: Shaun Patrick.   MRN: [id number]    DOB: 25-Mar-1956   Date:01/11/2016       Progress Note  Subjective  Chief Complaint  Chief Complaint  Patient presents with  . Gastroesophageal Reflux    patient is here for a 36-month f/u  . Hypertension  . Apnea    HPI  OSA: never on CPAP, had tonsillectomy and uvula - plasty about 20 years ago, and last Nov he had septoplasty. He works 2nd shift and still wakes up feeling tired for a little, snoring has improved since surgery.   GERD: he has noticed some worsening of symptoms lately. He has been drinking more beer than usual lately - but only every 12 days when off work 8 cans of 16 ounces each. He has also gained 10 lbs since lat visit and has been eating heavy meals before going to bed. She has been taking Omeprazole.   AR: he still has some post-nasal drainage and wakes up with throat being dry. He would like to resume Xyzal  Obesity: he has gained 10 lbs since last visit, he states he has been eating more than usual, drinking more than usual when off work but he did not realized he had gained so much weight. He will resume a better diet, try to eat dinner at work at 8 pm and only have snack when he gets home at night.     Patient Active Problem List   Diagnosis Date Noted  . Varicose veins of right lower extremity 05/16/2015  . Dysfunctional grieving 05/16/2015  . Perennial allergic rhinitis with seasonal variation 05/14/2015  . Benign hypertension 05/14/2015  . Benign prostatic hypertrophy with lower urinary tract symptoms (LUTS) 05/14/2015  . Dyslipidemia 05/14/2015  . ED (erectile dysfunction) of organic origin 05/14/2015  . Gastro-esophageal reflux disease without esophagitis 05/14/2015  . Dysmetabolic syndrome 05/14/2015  . Extreme obesity (HCC) 05/14/2015  . Obstructive apnea 05/14/2015  . History of bilateral knee replacement 05/14/2015  . History of shingles 03/20/2015    Past Surgical History  Procedure Laterality Date   . Knee surgery Bilateral     knee replacements  . Hernia repair      umbilical  . Neck surgery  1979    broke neck  . Tonsilectomy, adenoidectomy, bilateral myringotomy and tubes    . Joint replacement Bilateral     knees  . Tonsillectomy    . Nasal septoplasty w/ turbinoplasty Bilateral 10/17/2015    Procedure: NASAL SEPTOPLASTY WITH TURBINATE REDUCTION;  Surgeon: Linus Salmons, MD;  Location: [facility] ORS;  Service: ENT;  Laterality: Bilateral;    Family History  Problem Relation Age of Onset  . Stroke Mother   . Heart disease Mother   . Hypertension Mother   . Liver disease Father   . Hypertension Sister     Social History   Social History  . Marital Status: Married    Spouse Name: N/A  . Number of Children: N/A  . Years of Education: N/A   Occupational History  . Not on file.   Social History Main Topics  . Smoking status: Former Smoker -- 10 years    Types: Cigarettes    Start date: 12/03/1983    Quit date: 12/02/1993  . Smokeless tobacco: Never Used  . Alcohol Use: 0.0 oz/week    0 Standard drinks or equivalent per week     Comment: occasionally  . Drug Use: No  . Sexual Activity: No   Other Topics Concern  .  Not on file   Social History Narrative     Current outpatient prescriptions:  .  amLODipine-benazepril (LOTREL) 10-40 MG capsule, Take 1 capsule by mouth daily., Disp: 90 capsule, Rfl: 2 .  fluticasone (FLONASE) 50 MCG/ACT nasal spray, Place 2 sprays into both nostrils daily., Disp: 32 g, Rfl: 0 .  levocetirizine (XYZAL) 5 MG tablet, Take 1 tablet (5 mg total) by mouth every evening., Disp: 90 tablet, Rfl: 2 .  LORazepam (ATIVAN) 0.5 MG tablet, Take 1 tablet (0.5 mg total) by mouth 2 (two) times daily as needed for anxiety., Disp: 30 tablet, Rfl: 0 .  montelukast (SINGULAIR) 10 MG tablet, One pill by mouth daily, Disp: 90 tablet, Rfl: 2 .  Multiple Vitamin (MULTI-VITAMINS) TABS, Take 1 tablet by mouth 1 day or 1 dose., Disp: , Rfl:  .  omeprazole  (PRILOSEC) 40 MG capsule, Take 1 capsule (40 mg total) by mouth daily., Disp: 90 capsule, Rfl: 2 .  rosuvastatin (CRESTOR) 20 MG tablet, Take 1 tablet (20 mg total) by mouth daily., Disp: 90 tablet, Rfl: 2  Allergies  Allergen Reactions  . Flomax  [Tamsulosin Hcl]     dizziness  . Hydrocodone Anxiety     ROS  Constitutional: Negative for fever, positive for  weight change.  Respiratory: Negative for cough and shortness of breath.   Cardiovascular: Negative for chest pain or palpitations. Heartburn intermittently  Gastrointestinal: Negative for abdominal pain, no bowel changes.  Musculoskeletal: Negative for gait problem or joint swelling.  Skin: Negative for rash.  Neurological: Negative for dizziness or headache.  No other specific complaints in a complete review of systems (except as listed in HPI above).  Objective  Filed Vitals:   01/11/16 0948  BP: 122/60  Pulse: 84  Temp: 98.1 F (36.7 C)  TempSrc: Oral  Resp: 16  Height: 6' (1.829 m)  Weight: 293 lb 4.8 oz (133.04 kg)  SpO2: 96%    Body mass index is 39.77 kg/(m^2).  Physical Exam  Constitutional: Patient appears well-developed and well-nourished. Obese  No distress.  HEENT: head atraumatic, normocephalic, pupils equal and reactive to light, neck supple, throat within normal limits Cardiovascular: Normal rate, regular rhythm and normal heart sounds.  No murmur heard. No BLE edema. Pulmonary/Chest: Effort normal and breath sounds normal. No respiratory distress. Abdominal: Soft.  There is no tenderness. Psychiatric: Patient has a normal mood and affect. behavior is normal. Judgment and thought content normal.  PHQ2/9: Depression screen [facility] 2/9 01/11/2016 08/16/2015 05/16/2015  Decreased Interest 0 0 0  Down, Depressed, Hopeless 0 0 0  PHQ - 2 Score 0 0 0    Fall Risk: Fall Risk  01/11/2016 08/16/2015 05/16/2015  Falls in the past year? No No No     Functional Status Survey: Is the patient deaf or have  difficulty hearing?: No Does the patient have difficulty seeing, even when wearing glasses/contacts?: No Does the patient have difficulty concentrating, remembering, or making decisions?: No Does the patient have difficulty walking or climbing stairs?: No Does the patient have difficulty dressing or bathing?: No Does the patient have difficulty doing errands alone such as visiting a doctor's office or shopping?: No    Assessment & Plan  1. Benign hypertension  - amLODipine-benazepril (LOTREL) 10-40 MG capsule; Take 1 capsule by mouth daily.  Dispense: 90 capsule; Refill: 2  2. History of nasal septoplasty  Doing better  3. Obstructive apnea  Not on CPAP   4. Morbid obesity, unspecified obesity type [facility])  Discussed  with the patient the risk posed by an increased BMI. Discussed importance of portion control, calorie counting and at least 150 minutes of physical activity weekly. Avoid sweet beverages and drink more water. Eat at least 6 servings of fruit and vegetables daily   5. Dyslipidemia  Continue Crestor  6. Perennial allergic rhinitis with seasonal variation  - montelukast (SINGULAIR) 10 MG tablet; One pill by mouth daily  Dispense: 90 tablet; Refill: 2 - fluticasone (FLONASE) 50 MCG/ACT nasal spray; Place 2 sprays into both nostrils daily.  Dispense: 32 g; Refill: 0 - levocetirizine (XYZAL) 5 MG tablet; Take 1 tablet (5 mg total) by mouth every evening.  Dispense: 90 tablet; Refill: 2  7. Gastro-esophageal reflux disease without esophagitis  - omeprazole (PRILOSEC) 40 MG capsule; Take 1 capsule (40 mg total) by mouth daily.  Dispense: 90 capsule; Refill: 2

## 2016-01-11 NOTE — Patient Instructions (Signed)
Food Choices for Gastroesophageal Reflux Disease, Adult When you have gastroesophageal reflux disease (GERD), the foods you eat and your eating habits are very important. Choosing the right foods can help ease the discomfort of GERD. WHAT GENERAL GUIDELINES DO I NEED TO FOLLOW?  Choose fruits, vegetables, whole grains, low-fat dairy products, and low-fat meat, fish, and poultry.  Limit fats such as oils, salad dressings, butter, nuts, and avocado.  Keep a food diary to identify foods that cause symptoms.  Avoid foods that cause reflux. These may be different for different people.  Eat frequent small meals instead of three large meals each day.  Eat your meals slowly, in a relaxed setting.  Limit fried foods.  Cook foods using methods other than frying.  Avoid drinking alcohol.  Avoid drinking large amounts of liquids with your meals.  Avoid bending over or lying down until 2-3 hours after eating. WHAT FOODS ARE NOT RECOMMENDED? The following are some foods and drinks that may worsen your symptoms: Vegetables Tomatoes. Tomato juice. Tomato and spaghetti sauce. Chili peppers. Onion and garlic. Horseradish. Fruits Oranges, grapefruit, and lemon (fruit and juice). Meats High-fat meats, fish, and poultry. This includes hot dogs, ribs, ham, sausage, salami, and bacon. Dairy Whole milk and chocolate milk. Sour cream. Cream. Butter. Ice cream. Cream cheese.  Beverages Coffee and tea, with or without caffeine. Carbonated beverages or energy drinks. Condiments Hot sauce. Barbecue sauce.  Sweets/Desserts Chocolate and cocoa. Donuts. Peppermint and spearmint. Fats and Oils High-fat foods, including French fries and potato chips. Other Vinegar. Strong spices, such as black pepper, white pepper, red pepper, cayenne, curry powder, cloves, ginger, and chili powder. The items listed above may not be a complete list of foods and beverages to avoid. Contact your dietitian for more  information.   This information is not intended to replace advice given to you by your health care provider. Make sure you discuss any questions you have with your health care provider.   Document Released: 11/18/2005 Document Revised: 12/09/2014 Document Reviewed: 09/22/2013 Elsevier Interactive Patient Education 2016 Elsevier Inc.  

## 2016-02-08 ENCOUNTER — Ambulatory Visit: Payer: Managed Care, Other (non HMO) | Admitting: Family Medicine

## 2016-03-01 ENCOUNTER — Encounter: Payer: Managed Care, Other (non HMO) | Admitting: Family Medicine

## 2016-04-05 DIAGNOSIS — H52223 Regular astigmatism, bilateral: Secondary | ICD-10-CM | POA: Diagnosis not present

## 2016-04-09 ENCOUNTER — Encounter: Payer: Self-pay | Admitting: Family Medicine

## 2016-04-09 ENCOUNTER — Ambulatory Visit (INDEPENDENT_AMBULATORY_CARE_PROVIDER_SITE_OTHER): Payer: BLUE CROSS/BLUE SHIELD | Admitting: Family Medicine

## 2016-04-09 VITALS — BP 138/76 | HR 101 | Temp 98.3°F | Resp 18 | Wt 308.6 lb

## 2016-04-09 DIAGNOSIS — G4733 Obstructive sleep apnea (adult) (pediatric): Secondary | ICD-10-CM | POA: Diagnosis not present

## 2016-04-09 DIAGNOSIS — E785 Hyperlipidemia, unspecified: Secondary | ICD-10-CM

## 2016-04-09 DIAGNOSIS — Z0000 Encounter for general adult medical examination without abnormal findings: Secondary | ICD-10-CM

## 2016-04-09 DIAGNOSIS — R635 Abnormal weight gain: Secondary | ICD-10-CM | POA: Diagnosis not present

## 2016-04-09 DIAGNOSIS — Z125 Encounter for screening for malignant neoplasm of prostate: Secondary | ICD-10-CM

## 2016-04-09 DIAGNOSIS — I10 Essential (primary) hypertension: Secondary | ICD-10-CM | POA: Diagnosis not present

## 2016-04-09 DIAGNOSIS — E8881 Metabolic syndrome: Secondary | ICD-10-CM

## 2016-04-09 NOTE — Progress Notes (Signed)
Name: Shaun Patrick.   MRN: [id number]    DOB: 1956-11-09   Date:04/09/2016       Progress Note  Subjective  Chief Complaint  Chief Complaint  Patient presents with  . Annual Exam  . Labs Only    patient has not had any labs since 08/16/15.    HPI  Well Male Exam: he lost his job Feb 2017 at [employer] - worked there for 14  Years - and is now unemployed. He is going for orientation with [employer] next week. He has been drinking beer daily and is not as active. Looking forwards to starting working again.   Obesity: he has been gaining weight, we will check TSH.  Metabolic Syndrome: he denies polyphagia or polydipsia  HTN: he has been taking his medication, denies chest pain or palpitation  BPH: taking otc saw palmetto and is helping with symptoms. Flomax made him very tired  IPSS Questionnaire (AUA-7): Over the past month.   1)  How often have you had a sensation of not emptying your bladder completely after you finish urinating?  1 - Less than 1 time in 5  2)  How often have you had to urinate again less than two hours after you finished urinating? 0 - Not at all  3)  How often have you found you stopped and started again several times when you urinated?  0 - Not at all  4) How difficult have you found it to postpone urination?  0 - Not at all  5) How often have you had a weak urinary stream?  0 - Not at all  6) How often have you had to push or strain to begin urination?  1 - Less than 1 time in 5  7) How many times did you most typically get up to urinate from the time you went to bed until the time you got up in the morning?  2 - 2 times  Total score:  0-7 mildly symptomatic   8-19 moderately symptomatic   20-35 severely symptomatic    Patient Active Problem List   Diagnosis Date Noted  . Varicose veins of right lower extremity 05/16/2015  . Dysfunctional grieving 05/16/2015  . Perennial allergic rhinitis with seasonal variation 05/14/2015  . Benign  hypertension 05/14/2015  . Benign prostatic hypertrophy with lower urinary tract symptoms (LUTS) 05/14/2015  . Dyslipidemia 05/14/2015  . ED (erectile dysfunction) of organic origin 05/14/2015  . Gastro-esophageal reflux disease without esophagitis 05/14/2015  . Dysmetabolic syndrome 05/14/2015  . Extreme obesity (HCC) 05/14/2015  . Obstructive apnea 05/14/2015  . History of bilateral knee replacement 05/14/2015  . History of shingles 03/20/2015    Past Surgical History  Procedure Laterality Date  . Knee surgery Bilateral     knee replacements  . Hernia repair      umbilical  . Neck surgery  1979    broke neck  . Tonsilectomy, adenoidectomy, bilateral myringotomy and tubes    . Joint replacement Bilateral     knees  . Tonsillectomy    . Nasal septoplasty w/ turbinoplasty Bilateral 10/17/2015    Procedure: NASAL SEPTOPLASTY WITH TURBINATE REDUCTION;  Surgeon: Linus Salmons, MD;  Location: [facility] ORS;  Service: ENT;  Laterality: Bilateral;    Family History  Problem Relation Age of Onset  . Stroke Mother   . Heart disease Mother   . Hypertension Mother   . Liver disease Father   . Hypertension Sister     Social History  Social History  . Marital Status: Married    Spouse Name: N/A  . Number of Children: N/A  . Years of Education: N/A   Occupational History  . Not on file.   Social History Main Topics  . Smoking status: Former Smoker -- 10 years    Types: Cigarettes    Start date: 12/03/1983    Quit date: 12/02/1993  . Smokeless tobacco: Never Used  . Alcohol Use: 0.0 oz/week    0 Standard drinks or equivalent per week     Comment: occasionally  . Drug Use: No  . Sexual Activity: No   Other Topics Concern  . Not on file   Social History Narrative     Current outpatient prescriptions:  .  amLODipine-benazepril (LOTREL) 10-40 MG capsule, Take 1 capsule by mouth daily., Disp: 90 capsule, Rfl: 2 .  fluticasone (FLONASE) 50 MCG/ACT nasal spray, Place 2  sprays into both nostrils daily., Disp: 32 g, Rfl: 0 .  levocetirizine (XYZAL) 5 MG tablet, Take 1 tablet (5 mg total) by mouth every evening., Disp: 90 tablet, Rfl: 2 .  LORazepam (ATIVAN) 0.5 MG tablet, Take 1 tablet (0.5 mg total) by mouth 2 (two) times daily as needed for anxiety., Disp: 30 tablet, Rfl: 0 .  montelukast (SINGULAIR) 10 MG tablet, One pill by mouth daily, Disp: 90 tablet, Rfl: 2 .  Multiple Vitamin (MULTI-VITAMINS) TABS, Take 1 tablet by mouth 1 day or 1 dose., Disp: , Rfl:  .  omeprazole (PRILOSEC) 40 MG capsule, Take 1 capsule (40 mg total) by mouth daily., Disp: 90 capsule, Rfl: 2 .  rosuvastatin (CRESTOR) 20 MG tablet, Take 1 tablet (20 mg total) by mouth daily., Disp: 90 tablet, Rfl: 2  Allergies  Allergen Reactions  . Flomax  [Tamsulosin Hcl]     dizziness  . Hydrocodone Anxiety     ROS  Constitutional: Negative for fever , positive weight change.  Respiratory: Negative for cough and shortness of breath.   Cardiovascular: Negative for chest pain or palpitations.  Gastrointestinal: Negative for abdominal pain, no bowel changes.  Musculoskeletal: Negative for gait problem or joint swelling.  Skin: Negative for rash.  Neurological: Negative for dizziness or headache.  No other specific complaints in a complete review of systems (except as listed in HPI above).  Objective  Filed Vitals:   04/09/16 1029  BP: 138/76  Pulse: 101  Temp: 98.3 F (36.8 C)  TempSrc: Oral  Resp: 18  Weight: 308 lb 9.6 oz (139.98 kg)  SpO2: 96%    Body mass index is 41.84 kg/(m^2).  Physical Exam  Constitutional: Patient appears well-developed and morbid obese. No distress.  HENT: Head: Normocephalic and atraumatic. Ears: B TMs ok, no erythema or effusion; Nose: Nose normal. Mouth/Throat: Oropharynx is clear and moist. No oropharyngeal exudate.  Eyes: Conjunctivae and EOM are normal. Pupils are equal, round, and reactive to light. No scleral icterus.  Neck: Normal range of  motion. Neck supple. No JVD present. No thyromegaly present.  Cardiovascular: Normal rate, regular rhythm and normal heart sounds.  No murmur heard. No BLE edema. Pulmonary/Chest: Effort normal and breath sounds normal. No respiratory distress. Abdominal: Soft. Bowel sounds are normal, no distension. There is no tenderness. no masses MALE GENITALIA: Normal descended testes bilaterally, no masses palpated, no hernias, no lesions, no discharge RECTAL: Prostate is enlarged no masses, no rectal masses or hemorrhoids Musculoskeletal: Normal range of motion, no joint effusions. No gross deformities Neurological: he is alert and oriented to person, place,  and time. No cranial nerve deficit. Coordination, balance, strength, speech and gait are normal.  Skin: Skin is warm and dry. No rash noted. No erythema.  Psychiatric: Patient has a normal mood and affect. behavior is normal. Judgment and thought content normal.  PHQ2/9: Depression screen [facility] 2/9 04/09/2016 01/11/2016 08/16/2015 05/16/2015  Decreased Interest 0 0 0 0  Down, Depressed, Hopeless 0 0 0 0  PHQ - 2 Score 0 0 0 0    Fall Risk: Fall Risk  04/09/2016 01/11/2016 08/16/2015 05/16/2015  Falls in the past year? No No No No     Functional Status Survey: Is the patient deaf or have difficulty hearing?: No Does the patient have difficulty seeing, even when wearing glasses/contacts?: No Does the patient have difficulty concentrating, remembering, or making decisions?: No Does the patient have difficulty walking or climbing stairs?: No Does the patient have difficulty dressing or bathing?: No Does the patient have difficulty doing errands alone such as visiting a doctor's office or shopping?: No    Assessment & Plan  1. Encounter for routine history and physical exam for male  - Lipid panel - PSA - Comprehensive metabolic panel - CBC with Differential/Platelet - TSH  2. Morbid obesity, unspecified obesity type [facility](HCC)  Discussed with the  patient the risk posed by an increased BMI. Discussed importance of portion control, calorie counting and at least 150 minutes of physical activity weekly. Avoid sweet beverages and drink more water. Eat at least 6 servings of fruit and vegetables daily . He needs to stop drinking beer daily and not eat very late at night.   3. Prostate cancer screening  - PSA  4. Dysmetabolic syndrome  -hgbA1C  5. Dyslipidemia  - Lipid panel  6. Benign hypertension  - Comprehensive metabolic panel - CBC with Differential/Platelet  7. Obstructive apnea  - CBC with Differential/Platelet  8. Weight gain  - TSH

## 2016-04-10 ENCOUNTER — Telehealth: Payer: Self-pay

## 2016-04-10 LAB — COMPREHENSIVE METABOLIC PANEL
ALT: 30 IU/L (ref 0–44)
AST: 26 IU/L (ref 0–40)
Albumin/Globulin Ratio: 1.6 (ref 1.2–2.2)
Albumin: 4.4 g/dL (ref 3.5–5.5)
Alkaline Phosphatase: 92 IU/L (ref 39–117)
BUN/Creatinine Ratio: 13 (ref 9–20)
BUN: 12 mg/dL (ref 6–24)
Bilirubin Total: 0.3 mg/dL (ref 0.0–1.2)
CO2: 24 mmol/L (ref 18–29)
Calcium: 9.6 mg/dL (ref 8.7–10.2)
Chloride: 101 mmol/L (ref 96–106)
Creatinine, Ser: 0.96 mg/dL (ref 0.76–1.27)
GFR calc Af Amer: 100 mL/min/{1.73_m2} (ref >59)
GFR calc non Af Amer: 86 mL/min/{1.73_m2} (ref >59)
Globulin, Total: 2.7 g/dL (ref 1.5–4.5)
Glucose: 110 mg/dL — ABNORMAL HIGH (ref 65–99)
Potassium: 4.9 mmol/L (ref 3.5–5.2)
Sodium: 141 mmol/L (ref 134–144)
Total Protein: 7.1 g/dL (ref 6.0–8.5)

## 2016-04-10 LAB — CBC WITH DIFFERENTIAL/PLATELET
Basophils Absolute: 0 10*3/uL (ref 0.0–0.2)
Basos: 0 %
EOS (ABSOLUTE): 0.1 10*3/uL (ref 0.0–0.4)
Eos: 1 %
Hematocrit: 46.7 % (ref 37.5–51.0)
Hemoglobin: 15.4 g/dL (ref 12.6–17.7)
Immature Grans (Abs): 0 10*3/uL (ref 0.0–0.1)
Immature Granulocytes: 0 %
Lymphocytes Absolute: 2.3 10*3/uL (ref 0.7–3.1)
Lymphs: 30 %
MCH: 27.7 pg (ref 26.6–33.0)
MCHC: 33 g/dL (ref 31.5–35.7)
MCV: 84 fL (ref 79–97)
Monocytes Absolute: 0.4 10*3/uL (ref 0.1–0.9)
Monocytes: 6 %
Neutrophils Absolute: 4.8 10*3/uL (ref 1.4–7.0)
Neutrophils: 63 %
Platelets: 236 10*3/uL (ref 150–379)
RBC: 5.56 x10E6/uL (ref 4.14–5.80)
RDW: 15.7 % — ABNORMAL HIGH (ref 12.3–15.4)
WBC: 7.7 10*3/uL (ref 3.4–10.8)

## 2016-04-10 LAB — LIPID PANEL
Chol/HDL Ratio: 3.5 ratio units (ref 0.0–5.0)
Cholesterol, Total: 156 mg/dL (ref 100–199)
HDL: 44 mg/dL (ref >39)
LDL Calculated: 81 mg/dL (ref 0–99)
Triglycerides: 153 mg/dL — ABNORMAL HIGH (ref 0–149)
VLDL Cholesterol Cal: 31 mg/dL (ref 5–40)

## 2016-04-10 LAB — TSH: TSH: 1.86 u[IU]/mL (ref 0.450–4.500)

## 2016-04-10 LAB — PSA: Prostate Specific Ag, Serum: 3 ng/mL (ref 0.0–4.0)

## 2016-04-10 NOTE — Telephone Encounter (Signed)
-----   Message from Alba CoryKrichna Sowles, MD sent at 04/10/2016  7:46 AM EDT ----- Lipid panel has improved. HDL is at goal. Triglycerides is a little higher - needs to follow a low sugar ( avoid beer ) and low fat diet PSA is within normal limits Glucose is elevated ( he has pre diabetes ), normal kidney and liver function  Normal CBC and TSH

## 2016-04-10 NOTE — Telephone Encounter (Signed)
Left message for patient to return my call.

## 2016-06-09 DIAGNOSIS — R05 Cough: Secondary | ICD-10-CM | POA: Diagnosis not present

## 2016-07-14 DIAGNOSIS — M25521 Pain in right elbow: Secondary | ICD-10-CM | POA: Diagnosis not present

## 2016-08-08 DIAGNOSIS — M7711 Lateral epicondylitis, right elbow: Secondary | ICD-10-CM | POA: Diagnosis not present

## 2016-08-08 DIAGNOSIS — Z96652 Presence of left artificial knee joint: Secondary | ICD-10-CM | POA: Diagnosis not present

## 2016-08-08 DIAGNOSIS — Z96651 Presence of right artificial knee joint: Secondary | ICD-10-CM | POA: Diagnosis not present

## 2016-08-12 ENCOUNTER — Ambulatory Visit (INDEPENDENT_AMBULATORY_CARE_PROVIDER_SITE_OTHER): Payer: BLUE CROSS/BLUE SHIELD | Admitting: Family Medicine

## 2016-08-12 ENCOUNTER — Encounter: Payer: Self-pay | Admitting: Family Medicine

## 2016-08-12 VITALS — BP 132/76 | HR 91 | Temp 98.4°F | Resp 16 | Ht 72.0 in | Wt 304.8 lb

## 2016-08-12 DIAGNOSIS — E8881 Metabolic syndrome: Secondary | ICD-10-CM

## 2016-08-12 DIAGNOSIS — G4733 Obstructive sleep apnea (adult) (pediatric): Secondary | ICD-10-CM

## 2016-08-12 DIAGNOSIS — I10 Essential (primary) hypertension: Secondary | ICD-10-CM | POA: Diagnosis not present

## 2016-08-12 DIAGNOSIS — E785 Hyperlipidemia, unspecified: Secondary | ICD-10-CM | POA: Diagnosis not present

## 2016-08-12 DIAGNOSIS — K219 Gastro-esophageal reflux disease without esophagitis: Secondary | ICD-10-CM | POA: Diagnosis not present

## 2016-08-12 DIAGNOSIS — R972 Elevated prostate specific antigen [PSA]: Secondary | ICD-10-CM | POA: Diagnosis not present

## 2016-08-12 DIAGNOSIS — M7711 Lateral epicondylitis, right elbow: Secondary | ICD-10-CM | POA: Insufficient documentation

## 2016-08-12 LAB — PSA: PSA: 2.2 ng/mL (ref <=4.0)

## 2016-08-12 LAB — HEMOGLOBIN A1C
Hgb A1c MFr Bld: 5.9 % — ABNORMAL HIGH (ref <5.7)
Mean Plasma Glucose: 123 mg/dL

## 2016-08-12 NOTE — Progress Notes (Signed)
Name: Shaun AdamLuther Hoogendoorn Jr.   MRN: [id number]    DOB: 16-Jun-1956   Date:08/12/2016       Progress Note  Subjective  Chief Complaint  Chief Complaint  Patient presents with  . Medication Refill    4 month F/U  . Hypertension  . Allergic Rhinitis     Still having nasal drainage even after nasal surgery in December 2016  . Gastroesophageal Reflux    controlled with medication daily  . Hyperlipidemia    HPI  Obesity: he is working again, since June 2017 and has lost 5 lbs since last visit. He has also changed his diet recently. He is logging his calories on MyfitnessPal and is no longer drinking sodas and also stopped drinking beer, drinks water or vitamin water.   Metabolic Syndrome: he denies polyphagia or polydipsia, however he has polyuria, his last glucose 110 and we will check hgbA1C  HTN: he has been taking his medication, denies chest pain or palpitation. BP is at goal    BPH: taking otc saw palmetto and is helping with symptoms. Flomax made him very tired  S/p knee replacement: he sees Dr. Ernest PineHooten and x-ray is good, he still has pain on both knees and he is trying to lose weight to improve symptoms.   Right lateral epicondylitis: went to Urgent Care and had to wear sling. Also seen by Dr. Ernest PineHooten, likely work related from repetitive motion, he is doing well now.   Elevated PSA: we will recheck level  AR: he still has post-nasal drainage, denies itchy nose or rhinorrhea. He is on Xyzal and singulair but does not like using nasal steroid. Advised him to go back to ENT  Patient Active Problem List   Diagnosis Date Noted  . Right lateral epicondylitis 08/12/2016  . Varicose veins of right lower extremity 05/16/2015  . Dysfunctional grieving 05/16/2015  . Perennial allergic rhinitis with seasonal variation 05/14/2015  . Benign hypertension 05/14/2015  . Benign prostatic hypertrophy with lower urinary tract symptoms (LUTS) 05/14/2015  . Dyslipidemia 05/14/2015  . ED (erectile  dysfunction) of organic origin 05/14/2015  . Gastro-esophageal reflux disease without esophagitis 05/14/2015  . Dysmetabolic syndrome 05/14/2015  . Extreme obesity (HCC) 05/14/2015  . Obstructive apnea 05/14/2015  . History of bilateral knee replacement 05/14/2015  . History of shingles 03/20/2015    Past Surgical History:  Procedure Laterality Date  . HERNIA REPAIR     umbilical  . JOINT REPLACEMENT Bilateral    knees  . KNEE SURGERY Bilateral    knee replacements  . NASAL SEPTOPLASTY W/ TURBINOPLASTY Bilateral 10/17/2015   Procedure: NASAL SEPTOPLASTY WITH TURBINATE REDUCTION;  Surgeon: Linus Salmonshapman McQueen, MD;  Location: [facility] ORS;  Service: ENT;  Laterality: Bilateral;  . NECK SURGERY  1979   broke neck  . TONSILECTOMY, ADENOIDECTOMY, BILATERAL MYRINGOTOMY AND TUBES    . TONSILLECTOMY      Family History  Problem Relation Age of Onset  . Stroke Mother   . Heart disease Mother   . Hypertension Mother   . Liver disease Father   . Hypertension Sister     Social History   Social History  . Marital status: Married    Spouse name: N/A  . Number of children: N/A  . Years of education: N/A   Occupational History  . med lab techinician  [employer]   Social History Main Topics  . Smoking status: Former Smoker    Years: 10.00    Types: Cigarettes    Start  date: 12/03/1983    Quit date: 12/02/1993  . Smokeless tobacco: Never Used  . Alcohol use No     Comment: stopped May 2017  . Drug use: No  . Sexual activity: No   Other Topics Concern  . Not on file   Social History Narrative  . No narrative on file     Current Outpatient Prescriptions:  .  amLODipine-benazepril (LOTREL) 10-40 MG capsule, Take 1 capsule by mouth daily., Disp: 90 capsule, Rfl: 2 .  levocetirizine (XYZAL) 5 MG tablet, Take 1 tablet (5 mg total) by mouth every evening., Disp: 90 tablet, Rfl: 2 .  LORazepam (ATIVAN) 0.5 MG tablet, Take 1 tablet (0.5 mg total) by mouth 2 (two) times  daily as needed for anxiety., Disp: 30 tablet, Rfl: 0 .  montelukast (SINGULAIR) 10 MG tablet, One pill by mouth daily, Disp: 90 tablet, Rfl: 2 .  Multiple Vitamin (MULTI-VITAMINS) TABS, Take 1 tablet by mouth 1 day or 1 dose., Disp: , Rfl:  .  omeprazole (PRILOSEC) 40 MG capsule, Take 1 capsule (40 mg total) by mouth daily., Disp: 90 capsule, Rfl: 2 .  rosuvastatin (CRESTOR) 20 MG tablet, Take 1 tablet (20 mg total) by mouth daily., Disp: 90 tablet, Rfl: 2 .  saw palmetto 160 MG capsule, Take 160 mg by mouth daily., Disp: , Rfl:   Allergies  Allergen Reactions  . Flomax  [Tamsulosin Hcl]     dizziness  . Hydrocodone Anxiety     ROS  Constitutional: Negative for fever, positive for weight change.  Respiratory: Negative for cough and shortness of breath.   Cardiovascular: Negative for chest pain or palpitations.  Gastrointestinal: Negative for abdominal pain, no bowel changes.  Musculoskeletal: Positive  for gait problem - at times, no  joint swelling.  Skin: Negative for rash.  Neurological: Negative for dizziness or headache.  No other specific complaints in a complete review of systems (except as listed in HPI above).  Objective  Vitals:   08/12/16 0923  BP: 132/76  Pulse: 91  Resp: 16  Temp: 98.4 F (36.9 C)  TempSrc: Oral  SpO2: 97%  Weight: (!) 304 lb 12.8 oz (138.3 kg)  Height: 6' (1.829 m)    Body mass index is 41.34 kg/m.  Physical Exam  Constitutional: Patient appears well-developed and well-nourished. Obese  No distress.  HEENT: head atraumatic, normocephalic, pupils equal and reactive to light, neck supple, throat within normal limits Cardiovascular: Normal rate, regular rhythm and normal heart sounds.  No murmur heard. No BLE edema. Pulmonary/Chest: Effort normal and breath sounds normal. No respiratory distress. Abdominal: Soft.  There is no tenderness. Psychiatric: Patient has a normal mood and affect. behavior is normal. Judgment and thought content  normal.  PHQ2/9: Depression screen [facility] 2/9 08/12/2016 04/09/2016 01/11/2016 08/16/2015 05/16/2015  Decreased Interest 0 0 0 0 0  Down, Depressed, Hopeless 0 0 0 0 0  PHQ - 2 Score 0 0 0 0 0     Fall Risk: Fall Risk  08/12/2016 04/09/2016 01/11/2016 08/16/2015 05/16/2015  Falls in the past year? No No No No No     Functional Status Survey: Is the patient deaf or have difficulty hearing?: No Does the patient have difficulty seeing, even when wearing glasses/contacts?: No Does the patient have difficulty concentrating, remembering, or making decisions?: No Does the patient have difficulty walking or climbing stairs?: No Does the patient have difficulty dressing or bathing?: No Does the patient have difficulty doing errands alone such as visiting a  doctor's office or shopping?: No    Assessment & Plan  1. Dysmetabolic syndrome  - Hemoglobin A1c  2. Dyslipidemia  Continue medication   3. Benign hypertension  At goal   4. Obstructive apnea  He stopped using machine, explained increased risk of heart attacks and strokes with untreated OSA, he still snores  5. Gastro-esophageal reflux disease without esophagitis  Not working as well as prevacid, hopefully he will feel better as he loses more weight   6. Morbid obesity, unspecified obesity type (HCC)  Losing weight, changing his diet  7. PSA elevation  - PSA

## 2016-08-22 DIAGNOSIS — M76822 Posterior tibial tendinitis, left leg: Secondary | ICD-10-CM | POA: Diagnosis not present

## 2016-09-19 DIAGNOSIS — M76822 Posterior tibial tendinitis, left leg: Secondary | ICD-10-CM | POA: Diagnosis not present

## 2016-09-30 ENCOUNTER — Other Ambulatory Visit: Payer: Self-pay | Admitting: Family Medicine

## 2016-09-30 DIAGNOSIS — J3089 Other allergic rhinitis: Secondary | ICD-10-CM

## 2016-09-30 DIAGNOSIS — J302 Other seasonal allergic rhinitis: Secondary | ICD-10-CM

## 2016-09-30 NOTE — Telephone Encounter (Signed)
Patient requesting refill of Xyzal, Montelukast and Rosuvastatin to [facility].

## 2016-10-12 DIAGNOSIS — Z23 Encounter for immunization: Secondary | ICD-10-CM | POA: Diagnosis not present

## 2016-10-14 ENCOUNTER — Other Ambulatory Visit: Payer: Self-pay | Admitting: Family Medicine

## 2016-10-14 DIAGNOSIS — J3089 Other allergic rhinitis: Secondary | ICD-10-CM

## 2016-10-14 DIAGNOSIS — J302 Other seasonal allergic rhinitis: Secondary | ICD-10-CM

## 2016-10-16 ENCOUNTER — Telehealth: Payer: Self-pay | Admitting: Family Medicine

## 2016-10-16 NOTE — Telephone Encounter (Signed)
levocetirizine 5mg  is needing to be replaced by something different. Insurance will not cover it. Patient calling insurance company and asked if they had a list that with the name of medications that it will cover and the lady told him no that the doctor would have to find out. Patient is completely out of the mediation taking the last one this morning.

## 2016-10-17 NOTE — Telephone Encounter (Signed)
Insurance will not cover otc medication,  He can try loratadine , allegra or zyrtec otc

## 2016-10-17 NOTE — Telephone Encounter (Signed)
Patient notified

## 2016-10-30 ENCOUNTER — Other Ambulatory Visit: Payer: Self-pay | Admitting: Family Medicine

## 2016-10-30 DIAGNOSIS — I10 Essential (primary) hypertension: Secondary | ICD-10-CM

## 2016-11-12 ENCOUNTER — Ambulatory Visit: Payer: BLUE CROSS/BLUE SHIELD | Admitting: Family Medicine

## 2016-11-14 ENCOUNTER — Other Ambulatory Visit: Payer: Self-pay | Admitting: Family Medicine

## 2016-11-14 DIAGNOSIS — K219 Gastro-esophageal reflux disease without esophagitis: Secondary | ICD-10-CM

## 2016-11-14 NOTE — Telephone Encounter (Signed)
Patient requesting refill of Omeprazole to [facility]. 

## 2016-12-05 ENCOUNTER — Other Ambulatory Visit: Payer: Self-pay

## 2016-12-05 DIAGNOSIS — K219 Gastro-esophageal reflux disease without esophagitis: Secondary | ICD-10-CM

## 2016-12-05 DIAGNOSIS — J3089 Other allergic rhinitis: Secondary | ICD-10-CM

## 2016-12-05 DIAGNOSIS — I10 Essential (primary) hypertension: Secondary | ICD-10-CM

## 2016-12-05 DIAGNOSIS — J302 Other seasonal allergic rhinitis: Secondary | ICD-10-CM

## 2016-12-05 MED ORDER — ROSUVASTATIN CALCIUM 20 MG PO TABS: 20.0000 mg | ORAL_TABLET | Freq: Every day | ORAL | 0 refills | Status: DC

## 2016-12-05 MED ORDER — OMEPRAZOLE 40 MG PO CPDR: 40.0000 mg | DELAYED_RELEASE_CAPSULE | Freq: Every day | ORAL | 0 refills | Status: DC

## 2016-12-05 MED ORDER — MONTELUKAST SODIUM 10 MG PO TABS: 10.0000 mg | ORAL_TABLET | Freq: Every day | ORAL | 0 refills | Status: DC

## 2016-12-05 MED ORDER — AMLODIPINE BESY-BENAZEPRIL HCL 10-40 MG PO CAPS: 1.0000 | ORAL_CAPSULE | Freq: Every day | ORAL | 0 refills | Status: DC

## 2016-12-05 NOTE — Telephone Encounter (Addendum)
Patient requesting refill of Lotrel, Rosuvastatin, Singular and Omeprazole to [facility].

## 2016-12-24 ENCOUNTER — Ambulatory Visit: Payer: BLUE CROSS/BLUE SHIELD | Admitting: Family Medicine

## 2017-01-09 DIAGNOSIS — H52223 Regular astigmatism, bilateral: Secondary | ICD-10-CM | POA: Diagnosis not present

## 2017-02-08 DIAGNOSIS — K529 Noninfective gastroenteritis and colitis, unspecified: Secondary | ICD-10-CM | POA: Diagnosis not present

## 2017-02-21 ENCOUNTER — Telehealth: Payer: Self-pay | Admitting: Family Medicine

## 2017-02-21 DIAGNOSIS — J302 Other seasonal allergic rhinitis: Secondary | ICD-10-CM

## 2017-02-21 DIAGNOSIS — K219 Gastro-esophageal reflux disease without esophagitis: Secondary | ICD-10-CM

## 2017-02-21 DIAGNOSIS — I10 Essential (primary) hypertension: Secondary | ICD-10-CM

## 2017-02-21 DIAGNOSIS — J3089 Other allergic rhinitis: Secondary | ICD-10-CM

## 2017-02-27 ENCOUNTER — Other Ambulatory Visit: Payer: Self-pay | Admitting: Family Medicine

## 2017-02-27 DIAGNOSIS — J302 Other seasonal allergic rhinitis: Secondary | ICD-10-CM

## 2017-02-27 DIAGNOSIS — J3089 Other allergic rhinitis: Secondary | ICD-10-CM

## 2017-02-27 DIAGNOSIS — I10 Essential (primary) hypertension: Secondary | ICD-10-CM

## 2017-02-27 DIAGNOSIS — K219 Gastro-esophageal reflux disease without esophagitis: Secondary | ICD-10-CM

## 2017-02-27 MED ORDER — AMLODIPINE BESY-BENAZEPRIL HCL 10-40 MG PO CAPS: 1.0000 | ORAL_CAPSULE | Freq: Every day | ORAL | 0 refills | Status: DC

## 2017-02-27 MED ORDER — ROSUVASTATIN CALCIUM 20 MG PO TABS: 20.0000 mg | ORAL_TABLET | Freq: Every day | ORAL | 0 refills | Status: DC

## 2017-02-27 MED ORDER — OMEPRAZOLE 40 MG PO CPDR: 40.0000 mg | DELAYED_RELEASE_CAPSULE | Freq: Every day | ORAL | 0 refills | Status: DC

## 2017-02-27 MED ORDER — MONTELUKAST SODIUM 10 MG PO TABS: 10.0000 mg | ORAL_TABLET | Freq: Every day | ORAL | 0 refills | Status: DC

## 2017-02-27 NOTE — Telephone Encounter (Signed)
PT WAS CALLED AND NOTIFIED. DID NOT ACKKNOWLEDGE HE HEARD ME AND JUST HUNG UP.

## 2017-02-27 NOTE — Telephone Encounter (Signed)
SPOKE WITH PATIENT TO GET APPT FOR MED REFILL AND HE WANTED TO KNOW WHEN HE WAS LAST SEEN. TOLD HIM AND LOOKING AT YOUR SCHEDULE HE NEEDED MORNING APPT AND YOU HAD NOTHING TILL April 24TH. HE WAS VERY RUDE AND SAID THAT HE HAD BETTER NOT RUN OUT OF HIS MEDICATION OR HE WOULD NOT COME TO APPT AND FIND HIM ANOTHER DR. TOLD HIM I WAS JUST RELAYING THE MESSAGE AND HE SAID WELL YOU JUST RELAY THE MESSAGE I GAVE YOU. TOLD HIM I WOULD LET YOU KNOW THAT HE HAD MADE AN APPT AND SEE WHAT YOU SAID. HE ALSO SAID HE DID NOT UNDERSTAND WHY HE HAD TO COME FOR YOU WERE NOT CHANGING ANY OF HIS MEDS AND I TRIED TO EXPLAIN THAT DR COULD NOT KEEP GIVING PATIENTS THEIR MEDICATIONS WITH HAVING FOLLOW UPS AND FOR DOCUMENTATION PURPOSES.

## 2017-02-27 NOTE — Telephone Encounter (Signed)
I will send 30 days only to local pharmacy. No other refills. He needs to let us know if he will switch providers.

## 2017-03-10 ENCOUNTER — Ambulatory Visit: Payer: BLUE CROSS/BLUE SHIELD | Admitting: Family Medicine

## 2017-03-12 ENCOUNTER — Ambulatory Visit: Payer: BLUE CROSS/BLUE SHIELD | Admitting: Family Medicine

## 2017-03-12 ENCOUNTER — Encounter: Payer: Self-pay | Admitting: Nurse Practitioner

## 2017-03-12 ENCOUNTER — Ambulatory Visit (INDEPENDENT_AMBULATORY_CARE_PROVIDER_SITE_OTHER): Payer: BLUE CROSS/BLUE SHIELD | Admitting: Nurse Practitioner

## 2017-03-12 VITALS — BP 113/69 | HR 67 | Temp 97.8°F | Resp 16 | Ht 72.0 in | Wt 292.0 lb

## 2017-03-12 DIAGNOSIS — I10 Essential (primary) hypertension: Secondary | ICD-10-CM

## 2017-03-12 DIAGNOSIS — J3089 Other allergic rhinitis: Secondary | ICD-10-CM | POA: Diagnosis not present

## 2017-03-12 DIAGNOSIS — E785 Hyperlipidemia, unspecified: Secondary | ICD-10-CM

## 2017-03-12 DIAGNOSIS — K219 Gastro-esophageal reflux disease without esophagitis: Secondary | ICD-10-CM

## 2017-03-12 DIAGNOSIS — J302 Other seasonal allergic rhinitis: Secondary | ICD-10-CM

## 2017-03-12 DIAGNOSIS — Z7689 Persons encountering health services in other specified circumstances: Secondary | ICD-10-CM | POA: Diagnosis not present

## 2017-03-12 DIAGNOSIS — R7303 Prediabetes: Secondary | ICD-10-CM

## 2017-03-12 MED ORDER — ROSUVASTATIN CALCIUM 20 MG PO TABS: 20.0000 mg | ORAL_TABLET | Freq: Every day | ORAL | 2 refills | Status: DC

## 2017-03-12 MED ORDER — MONTELUKAST SODIUM 10 MG PO TABS: 10.0000 mg | ORAL_TABLET | Freq: Every day | ORAL | 2 refills | Status: DC

## 2017-03-12 MED ORDER — AMLODIPINE BESY-BENAZEPRIL HCL 10-40 MG PO CAPS: 1.0000 | ORAL_CAPSULE | Freq: Every day | ORAL | 2 refills | Status: DC

## 2017-03-12 MED ORDER — LANSOPRAZOLE 15 MG PO CPDR: 15.0000 mg | DELAYED_RELEASE_CAPSULE | Freq: Every day | ORAL | 2 refills | Status: DC

## 2017-03-12 MED ORDER — ROSUVASTATIN CALCIUM 20 MG PO TABS: 20.0000 mg | ORAL_TABLET | Freq: Every day | ORAL | 0 refills | Status: DC

## 2017-03-12 NOTE — Patient Instructions (Addendum)
Shaun Patrick, Thank you for coming in to clinic today.  1. FOr your allergies and sinus drainage:  - continue your montelukast (singular) 10 mg tablet once daily - Continue your cetirizine (Zyrtec) 10 mg tablet once daily - avoid medications with decongestants  2. Come back to the clinic any time Thursday through next Wedneday for labs between 8-11:30.  FASTING without food.  ONLY water, black coffee, or black,unsweetened tea.  3. Blood pressure, cholesterol, prediabetes, and your reflux: - Continue taking yourmedications.  We changed your omeprazole to lansoprazole (Prilosec).  Take one tablet daily after you are finished with your current prescription.  Please schedule a follow-up appointment with [name], AGNP in 3 months.  If you have any other questions or concerns, please feel free to call the clinic or send a message through MyChart. You may also schedule an earlier appointment if necessary.  [name], DNP, AGNP-BC Adult Gerontology Nurse Practitioner [facility]   DASH Eating Plan DASH stands for "Dietary Approaches to Stop Hypertension." The DASH eating plan is a healthy eating plan that has been shown to reduce high blood pressure (hypertension). It may also reduce your risk for type 2 diabetes, heart disease, and stroke. The DASH eating plan may also help with weight loss. What are tips for following this plan? General guidelines   Avoid eating more than 2,300 mg (milligrams) of salt (sodium) a day. If you have hypertension, you may need to reduce your sodium intake to 1,500 mg a day.  Limit alcohol intake to no more than 1 drink a day for nonpregnant women and 2 drinks a day for men. One drink equals 12 oz of beer, 5 oz of wine, or 1 oz of hard liquor.  Work with your health care provider to maintain a healthy body weight or to lose weight. Ask what an ideal weight is for you.  Get at least 30 minutes of exercise that causes your heart to  beat faster (aerobic exercise) most days of the week. Activities may include walking, swimming, or biking.  Work with your health care provider or diet and nutrition specialist (dietitian) to adjust your eating plan to your individual calorie needs. Reading food labels   Check food labels for the amount of sodium per serving. Choose foods with less than 5 percent of the Daily Value of sodium. Generally, foods with less than 300 mg of sodium per serving fit into this eating plan.  To find whole grains, look for the word "whole" as the first word in the ingredient list. Shopping   Buy products labeled as "low-sodium" or "no salt added."  Buy fresh foods. Avoid canned foods and premade or frozen meals. Cooking   Avoid adding salt when cooking. Use salt-free seasonings or herbs instead of table salt or sea salt. Check with your health care provider or pharmacist before using salt substitutes.  Do not fry foods. Cook foods using healthy methods such as baking, boiling, grilling, and broiling instead.  Cook with heart-healthy oils, such as olive, canola, soybean, or sunflower oil. Meal planning    Eat a balanced diet that includes:  5 or more servings of fruits and vegetables each day. At each meal, try to fill half of your plate with fruits and vegetables.  Up to 6-8 servings of whole grains each day.  Less than 6 oz of lean meat, poultry, or fish each day. A 3-oz serving of meat is about the same size as a deck of  cards. One egg equals 1 oz.  2 servings of low-fat dairy each day.  A serving of nuts, seeds, or beans 5 times each week.  Heart-healthy fats. Healthy fats called Omega-3 fatty acids are found in foods such as flaxseeds and coldwater fish, like sardines, salmon, and mackerel.  Limit how much you eat of the following:  Canned or prepackaged foods.  Food that is high in trans fat, such as fried foods.  Food that is high in saturated fat, such as fatty meat.  Sweets,  desserts, sugary drinks, and other foods with added sugar.  Full-fat dairy products.  Do not salt foods before eating.  Try to eat at least 2 vegetarian meals each week.  Eat more home-cooked food and less restaurant, buffet, and fast food.  When eating at a restaurant, ask that your food be prepared with less salt or no salt, if possible. What foods are recommended? The items listed may not be a complete list. Talk with your dietitian about what dietary choices are best for you. Grains  Whole-grain or whole-wheat bread. Whole-grain or whole-wheat pasta. Brown rice. Orpah Cobb. Bulgur. Whole-grain and low-sodium cereals. Pita bread. Low-fat, low-sodium crackers. Whole-wheat flour tortillas. Vegetables  Fresh or frozen vegetables (raw, steamed, roasted, or grilled). Low-sodium or reduced-sodium tomato and vegetable juice. Low-sodium or reduced-sodium tomato sauce and tomato paste. Low-sodium or reduced-sodium canned vegetables. Fruits  All fresh, dried, or frozen fruit. Canned fruit in natural juice (without added sugar). Meat and other protein foods  Skinless chicken or [place]. Ground chicken or [place]. Pork with fat trimmed off. Fish and seafood. Egg whites. Dried beans, peas, or lentils. Unsalted nuts, nut butters, and seeds. Unsalted canned beans. Lean cuts of beef with fat trimmed off. Low-sodium, lean deli meat. Dairy  Low-fat (1%) or fat-free (skim) milk. Fat-free, low-fat, or reduced-fat cheeses. Nonfat, low-sodium ricotta or cottage cheese. Low-fat or nonfat yogurt. Low-fat, low-sodium cheese. Fats and oils  Soft margarine without trans fats. Vegetable oil. Low-fat, reduced-fat, or light mayonnaise and salad dressings (reduced-sodium). Canola, safflower, olive, soybean, and sunflower oils. Avocado. Seasoning and other foods  Herbs. Spices. Seasoning mixes without salt. Unsalted popcorn and pretzels. Fat-free sweets. What foods are not recommended? The items listed may not be  a complete list. Talk with your dietitian about what dietary choices are best for you. Grains  Baked goods made with fat, such as croissants, muffins, or some breads. Dry pasta or rice meal packs. Vegetables  Creamed or fried vegetables. Vegetables in a cheese sauce. Regular canned vegetables (not low-sodium or reduced-sodium). Regular canned tomato sauce and paste (not low-sodium or reduced-sodium). Regular tomato and vegetable juice (not low-sodium or reduced-sodium). Rosita Fire. Olives. Fruits  Canned fruit in a light or heavy syrup. Fried fruit. Fruit in cream or butter sauce. Meat and other protein foods  Fatty cuts of meat. Ribs. Fried meat. Tomasa Blase. Sausage. Bologna and other processed lunch meats. Salami. Fatback. Hotdogs. Bratwurst. Salted nuts and seeds. Canned beans with added salt. Canned or smoked fish. Whole eggs or egg yolks. Chicken or [place] with skin. Dairy  Whole or 2% milk, cream, and half-and-half. Whole or full-fat cream cheese. Whole-fat or sweetened yogurt. Full-fat cheese. Nondairy creamers. Whipped toppings. Processed cheese and cheese spreads. Fats and oils  Butter. Stick margarine. Lard. Shortening. Ghee. Bacon fat. Tropical oils, such as coconut, palm kernel, or palm oil. Seasoning and other foods  Salted popcorn and pretzels. Onion salt, garlic salt, seasoned salt, table salt, and sea salt. Worcestershire sauce. Tartar  sauce. Barbecue sauce. Teriyaki sauce. Soy sauce, including reduced-sodium. Steak sauce. Canned and packaged gravies. Fish sauce. Oyster sauce. Cocktail sauce. Horseradish that you find on the shelf. Ketchup. Mustard. Meat flavorings and tenderizers. Bouillon cubes. Hot sauce and Tabasco sauce. Premade or packaged marinades. Premade or packaged taco seasonings. Relishes. Regular salad dressings. Where to find more information:  National Heart, Lung, and Blood Institute: PopSteam.is  American Heart Association: www.heart.org Summary  The DASH eating  plan is a healthy eating plan that has been shown to reduce high blood pressure (hypertension). It may also reduce your risk for type 2 diabetes, heart disease, and stroke.  With the DASH eating plan, you should limit salt (sodium) intake to 2,300 mg a day. If you have hypertension, you may need to reduce your sodium intake to 1,500 mg a day.  When on the DASH eating plan, aim to eat more fresh fruits and vegetables, whole grains, lean proteins, low-fat dairy, and heart-healthy fats.  Work with your health care provider or diet and nutrition specialist (dietitian) to adjust your eating plan to your individual calorie needs. This information is not intended to replace advice given to you by your health care provider. Make sure you discuss any questions you have with your health care provider. Document Released: 11/07/2011 Document Revised: 11/11/2016 Document Reviewed: 11/11/2016 Elsevier Interactive Patient Education  2017 [employer].

## 2017-03-12 NOTE — Progress Notes (Signed)
Subjective:    Patient ID: Shaun Harps., male    DOB: 27-May-1956, 61 y.o.   MRN: [id number]  Shaun Riedl. is a 61 y.o. male presenting on 03/12/2017 for Establish Care   HPI  Mr. Tiberio was previously seen by Dr. Carlynn Purl at [facility].  States he does not want to follow-up every 3 months as was required by Dr. Carlynn Purl.  Allergies   Patient notes he has had worse allergy symptoms of nasal drainage and post-nasal drip.  He attributes his worsened symptoms to pollen.  He is currently taking Zyrtec and Singulair.  Meaning to patient is needing to clear secretions from his throat frequently.  Denies sinus pressure/pain, ear pain/fullness, cough, and fever, chills and sweats.  Review of Medical History for medication refills: Prediabetes He generally tries to eat healthy.  Works 2nd shift, so is difficult to always eat well.  He has decreased consumption of beer to none in the past 2-3 months.  He also is avoiding fatty foods, dark-colored soda.  He drinks plenty of water and unsweet tea.  He admits he has not been as active over the last year to 6 months.  He is currently not taking medications for improving blood sugar.  He is taking a statin for cholesterol management and ASCVD risk reduction.  Retired last year and gained to 310, now under 300 and has lost a total of 18 lbs over the last 6 months.  His weight loss goal is to weigh 250 lbs.  Hypertension Mr. Ribaudo is taking grapefruit juice and vinegar as natural remedies to help control his hypertension.  He also takes amlodipine-benazepril 10-40mg  tablet once daily.  He denies headache, lightheadedness, dizziness, chest tightness/pressure, palpitations, sudden loss of speech or loss of consiousness.  Sleep Apnea He has a long history of attempts at surgical airway opening.  Not using CPAP (an old machine).  He states he "sleeps good" and doesn't have any daytime sleepiness like he did when he was first diagnosed.  GERD He  has taken prevacid In the past and has requested to change.  Dr. Carlynn Purl transitioned him to Prilosec and feels his acid reflux was better controlled on Prevacid.  He does notice that heartburn is worse with certain foods and he tries to avoid them.  When he stops his medicine, he has worse reflux.  If he has heartburn, he takes tums with relief.  He has been on PPI therapy for many years.  Past Medical History:  Diagnosis Date  . GERD (gastroesophageal reflux disease)   . Hypertension    Past Surgical History:  Procedure Laterality Date  . HERNIA REPAIR     umbilical  . JOINT REPLACEMENT Bilateral    knees  . KNEE SURGERY Bilateral    knee replacements  . NASAL SEPTOPLASTY W/ TURBINOPLASTY Bilateral 10/17/2015   Procedure: NASAL SEPTOPLASTY WITH TURBINATE REDUCTION;  Surgeon: Linus Salmons, MD;  Location: [facility] ORS;  Service: ENT;  Laterality: Bilateral;  . NECK SURGERY  1979   broke neck  . TONSILECTOMY, ADENOIDECTOMY, BILATERAL MYRINGOTOMY AND TUBES    . TONSILLECTOMY     Social History   Social History  . Marital status: Married    Spouse name: N/A  . Number of children: N/A  . Years of education: N/A   Occupational History  . med lab techinician  [employer]   Social History Main Topics  . Smoking status: Former Smoker    Years: 10.00  Types: Cigarettes    Start date: 12/03/1983    Quit date: 12/02/1993  . Smokeless tobacco: Never Used  . Alcohol use No     Comment: stopped May 2017  . Drug use: No  . Sexual activity: No   Other Topics Concern  . Not on file   Social History Narrative  . No narrative on file   Family History  Problem Relation Age of Onset  . Stroke Mother   . Heart disease Mother   . Hypertension Mother   . Liver disease Father   . Hypertension Sister    Current Outpatient Prescriptions on File Prior to Visit  Medication Sig  . Multiple Vitamin (MULTI-VITAMINS) TABS Take 1 tablet by mouth 1 day or 1 dose.  . saw  palmetto 160 MG capsule Take 160 mg by mouth daily.  Marland Kitchen levocetirizine (XYZAL) 5 MG tablet TAKE 1 TABLET (5 MG TOTAL) BY MOUTH EVERY EVENING. (Patient not taking: Reported on 03/12/2017)   No current facility-administered medications on file prior to visit.     Review of Systems  Constitutional: Negative.   HENT: Positive for postnasal drip.   Eyes: Negative.   Respiratory: Negative.   Cardiovascular: Negative.   Gastrointestinal: Negative.   Endocrine: Positive for polyuria.  Genitourinary: Positive for frequency.  Musculoskeletal: Negative.   Skin: Negative.        Skin tags around eye  Allergic/Immunologic: Negative.   Neurological: Negative.   Hematological: Negative.   Psychiatric/Behavioral: Negative.    Per HPI unless specifically indicated above    Objective:    BP 113/69   Pulse 67   Temp 97.8 F (36.6 C) (Oral)   Resp 16   Ht 6' (1.829 m)   Wt 292 lb (132.5 kg)   BMI 39.60 kg/m   Wt Readings from Last 3 Encounters:  03/12/17 292 lb (132.5 kg)  08/12/16 (!) 304 lb 12.8 oz (138.3 kg)  04/09/16 (!) 308 lb 9.6 oz (140 kg)    Physical Exam  Constitutional: He is oriented to person, place, and time. He appears well-developed and well-nourished. No distress.  Obese  HENT:  Head: Normocephalic and atraumatic.  Right Ear: External ear normal.  Left Ear: External ear normal.  Mouth/Throat: Oropharynx is clear and moist. No oropharyngeal exudate, posterior oropharyngeal edema or posterior oropharyngeal erythema.  Eyes: Pupils are equal, round, and reactive to light.  Neck: Normal range of motion. Neck supple. No JVD present.  Negative carotid bruits  Cardiovascular: Normal rate, regular rhythm, normal heart sounds and intact distal pulses.   Pulmonary/Chest: Effort normal and breath sounds normal. No stridor.  Abdominal: Soft. Bowel sounds are normal.  Lymphadenopathy:    He has no cervical adenopathy.  Neurological: He is alert and oriented to person, place, and  time.  Skin: Skin is warm and dry.  Psychiatric: He has a normal mood and affect. His behavior is normal. Thought content normal.       Assessment & Plan:   Problem List Items Addressed This Visit      Cardiovascular and Mediastinum   Benign hypertension Stable, Improving  1. Continue current Lotrel 10-40 mg capsule once daily.  Refills sent. 2. Check CMP for evaluation of kidney function for ongoing medication use.   Relevant Medications   rosuvastatin (CRESTOR) 20 MG tablet   rosuvastatin (CRESTOR) 20 MG tablet   amLODipine-benazepril (LOTREL) 10-40 MG capsule   Other Relevant Orders   COMPLETE METABOLIC PANEL WITH GFR     Respiratory  Perennial allergic rhinitis with seasonal variation Worsening r/t seasonal pollen  1. Continue taking Zyrtec and Singulair once daily. 2. Discussed flonase and other medication options.  Patient declined flonase.   Relevant Medications   montelukast (SINGULAIR) 10 MG tablet     Other   Dyslipidemia Assess labs to determine severity of diagnosis.  1. Continue taking rosuvastatin 20 mg tablet once daily. 2. Refill sent to local pharmacy for bridge to mail order refill.  3. Check Lipid panel to assess efficacy of medication.   4. Check CMP to assess liver function in presence of long-term statin use.   Relevant Medications   rosuvastatin (CRESTOR) 20 MG tablet   rosuvastatin (CRESTOR) 20 MG tablet   Other Relevant Orders   Lipid panel   COMPLETE METABOLIC PANEL WITH GFR    Other Visit Diagnoses    Encounter to establish care with new doctor    -  Primary   Gastroesophageal reflux disease, esophagitis presence not specified     Stable symptoms.  1. Discussed the pros and cons of long -term PPI use.  Patient wants to continue PPI for now.  Consider changing therapy in the future r/t bone effects of medication. 2. Change patient to Prevacid from Prilosec for patient preference.   Relevant Medications   lansoprazole (PREVACID) 15 MG  capsule   Prediabetes     1. Re-evaluate HgbA1c and consider adding metformin if medication needed. 2. Discussed impact of further weight loss on DM. 3. Encouraged patient to work on eating healthy with more whole grains and vegetables.  Add physical activity to regular routine.  Follow-up in 3 months for further evaluation.  Consider every 6 month follow-up if all medical conditions are stable and improving.   Relevant Orders   Hemoglobin A1c      Meds ordered this encounter  Medications  . cetirizine (ZYRTEC) 10 MG tablet    Sig: Take 10 mg by mouth daily.  . rosuvastatin (CRESTOR) 20 MG tablet    Sig: Take 1 tablet (20 mg total) by mouth daily.    Dispense:  90 tablet    Refill:  2    Order Specific Question:   Supervising Provider    Answer:   Smitty Cords [[id number]]  . rosuvastatin (CRESTOR) 20 MG tablet    Sig: Take 1 tablet (20 mg total) by mouth daily.    Dispense:  4 tablet    Refill:  0    Order Specific Question:   Supervising Provider    Answer:   Smitty Cords [[id number]]  . lansoprazole (PREVACID) 15 MG capsule    Sig: Take 1 capsule (15 mg total) by mouth daily at 12 noon.    Dispense:  90 capsule    Refill:  2    Order Specific Question:   Supervising Provider    Answer:   Smitty Cords [[id number]]  . montelukast (SINGULAIR) 10 MG tablet    Sig: Take 1 tablet (10 mg total) by mouth daily.    Dispense:  90 tablet    Refill:  2    Order Specific Question:   Supervising Provider    Answer:   Smitty Cords [[id number]]  . amLODipine-benazepril (LOTREL) 10-40 MG capsule    Sig: Take 1 capsule by mouth daily.    Dispense:  90 capsule    Refill:  2    Order Specific Question:   Supervising Provider    Answer:   Smitty Cords [[id number]]  Follow up plan: Return in about 3 months (around 06/11/2017) for BP, preDM, lipid, GERD.  [name], DNP, AGPCNP-BC Adult Gerontology Primary Care Nurse Practitioner [facility] 03/12/2017, 11:05 PM

## 2017-03-13 ENCOUNTER — Other Ambulatory Visit: Payer: BLUE CROSS/BLUE SHIELD

## 2017-03-13 NOTE — Progress Notes (Signed)
I have reviewed this encounter including the documentation in this note and/or discussed this patient with the provider, [name], AGPCNP-BC. I am certifying that I agree with the content of this note as supervising physician.  Saralyn Pilar, DO [facility] 03/13/2017, 8:17 AM

## 2017-03-19 DIAGNOSIS — I10 Essential (primary) hypertension: Secondary | ICD-10-CM | POA: Diagnosis not present

## 2017-03-19 DIAGNOSIS — E785 Hyperlipidemia, unspecified: Secondary | ICD-10-CM | POA: Diagnosis not present

## 2017-03-19 DIAGNOSIS — R7303 Prediabetes: Secondary | ICD-10-CM | POA: Diagnosis not present

## 2017-03-19 LAB — COMPLETE METABOLIC PANEL WITH GFR
ALT: 24 U/L (ref 9–46)
AST: 30 U/L (ref 10–35)
Albumin: 3.7 g/dL (ref 3.6–5.1)
Alkaline Phosphatase: 82 U/L (ref 40–115)
BUN: 15 mg/dL (ref 7–25)
CO2: 24 mmol/L (ref 20–31)
Calcium: 8.9 mg/dL (ref 8.6–10.3)
Chloride: 107 mmol/L (ref 98–110)
Creat: 0.83 mg/dL (ref 0.70–1.25)
GFR, Est African American: >89 mL/min (ref >=60)
GFR, Est Non African American: >89 mL/min (ref >=60)
Glucose, Bld: 96 mg/dL (ref 65–99)
Potassium: 4.8 mmol/L (ref 3.5–5.3)
Sodium: 139 mmol/L (ref 135–146)
Total Bilirubin: 0.4 mg/dL (ref 0.2–1.2)
Total Protein: 6.4 g/dL (ref 6.1–8.1)

## 2017-03-19 LAB — LIPID PANEL
Cholesterol: 110 mg/dL (ref <200)
HDL: 30 mg/dL — ABNORMAL LOW (ref >40)
LDL Cholesterol: 64 mg/dL (ref <100)
Total CHOL/HDL Ratio: 3.7 Ratio (ref <5.0)
Triglycerides: 80 mg/dL (ref <150)
VLDL: 16 mg/dL (ref <30)

## 2017-03-20 LAB — HEMOGLOBIN A1C
Hgb A1c MFr Bld: 5.7 % — ABNORMAL HIGH (ref <5.7)
Mean Plasma Glucose: 117 mg/dL

## 2017-03-21 ENCOUNTER — Other Ambulatory Visit: Payer: Self-pay | Admitting: Nurse Practitioner

## 2017-03-25 ENCOUNTER — Ambulatory Visit: Payer: BLUE CROSS/BLUE SHIELD | Admitting: Family Medicine

## 2017-06-17 ENCOUNTER — Ambulatory Visit (INDEPENDENT_AMBULATORY_CARE_PROVIDER_SITE_OTHER): Payer: BLUE CROSS/BLUE SHIELD | Admitting: Nurse Practitioner

## 2017-06-17 ENCOUNTER — Encounter: Payer: Self-pay | Admitting: Nurse Practitioner

## 2017-06-17 VITALS — BP 126/69 | HR 72 | Temp 98.3°F | Ht 72.0 in | Wt 296.6 lb

## 2017-06-17 DIAGNOSIS — I8391 Asymptomatic varicose veins of right lower extremity: Secondary | ICD-10-CM

## 2017-06-17 DIAGNOSIS — R7303 Prediabetes: Secondary | ICD-10-CM

## 2017-06-17 DIAGNOSIS — Z6841 Body Mass Index (BMI) 40.0 and over, adult: Secondary | ICD-10-CM | POA: Diagnosis not present

## 2017-06-17 DIAGNOSIS — I10 Essential (primary) hypertension: Secondary | ICD-10-CM

## 2017-06-17 DIAGNOSIS — K219 Gastro-esophageal reflux disease without esophagitis: Secondary | ICD-10-CM

## 2017-06-17 DIAGNOSIS — J3089 Other allergic rhinitis: Secondary | ICD-10-CM | POA: Diagnosis not present

## 2017-06-17 LAB — POCT GLYCOSYLATED HEMOGLOBIN (HGB A1C): Hemoglobin A1C: 6.2

## 2017-06-17 MED ORDER — LEVOCETIRIZINE DIHYDROCHLORIDE 5 MG PO TABS: 5.0000 mg | ORAL_TABLET | Freq: Every evening | ORAL | 3 refills | Status: DC

## 2017-06-17 NOTE — Progress Notes (Signed)
Subjective:    Patient ID: Shaun Thivierge., male    DOB: [birth year], 61 y.o.   MRN: [id number]  Shaun Bloor. is a 61 y.o. male presenting on 06/17/2017 for Hypertension (pre-diabetes , post-nasal drainage)   HPI  Prediabetes Pt notes he has been "prediabetic for years." He is not currently on any medication and does not want to start any. Has not made any lifestyle changes since last visit.   Pt works 2nd shift.  Usually eats lunch w/ his wife, then dinner/snack when he comes home from work.  He then goes to bed with a full stomach.  Notes he eats poor food choices late at night.  Evening "snack" last night: two slices italian sausage pizza, chocolate turtle, two oatmeal raisin cookies. Drinks 6-8 beers on Sundays.  Recent Labs  08/12/16 1032 03/19/17 0001 06/17/17 1208  HGBA1C 5.9* 5.7* 6.2   Hypertension He is not checking BP at home.    Current medications: amlodipine-benazepril 10-40 mg once daily, tolerating well without side effects Pt denies headache, lightheadedness, dizziness, changes in vision, chest tightness/pressure, palpitations, leg swelling, sudden loss of speech or loss of consciousness.  Sinus Drainage Gets occasional tickle in throat/ sinus drainage constantly.  Throat is very dry at night and he wakes up needing water.  Symptoms are worse after he mows yard.  He does not use any CPAP machine or fan blowing in his face. Keeps windows of bedroom closed. Has friend who died from throat cancer and is concerned about his throat because of his friend.   He currently takes montelukast 10 mg once daily, cetirizine 10 mg once daily for his symptoms.  He states he has tried intermittent dosing of flonase in past but "it doesn't work."  GERD Has breakthrough heartburn requiring use of Tums daily even while on lansoprazole 15 mg once daily.  Pt wants to discuss this medication today.    Morbid Obesity Pt states he has not been able to lose weight in last few years.   He used to be around 200 lbs, but gained weight after initial sleep apnea diagnosis many years ago.  He states he usually fluctuates between 280-300 lbs now. He has not started exercising or cutting back on how much he eats or drinks.  PVD - Varicose veins Pt wears compression socks when working and standing for prolonged periods of time.  Since wearing these, he has not had any spontaneous rupture of varicose veins and is doing much better.  He previously had problems with spontaneous varicose vein rupture.  Notes no current problems.  Social History  Substance Use Topics  . Smoking status: Former Smoker    Years: 10.00    Types: Cigarettes    Start date: 12/03/1983    Quit date: 12/02/1993  . Smokeless tobacco: Never Used  . Alcohol use 2.4 oz/week    4 Cans of beer per week     Comment: beers on the weekend    Review of Systems Per HPI unless specifically indicated above     Objective:    BP 126/69 (BP Location: Right Arm, Patient Position: Sitting, Cuff Size: Large)   Pulse 72   Temp 98.3 F (36.8 C) (Oral)   Ht 6' (1.829 m)   Wt 296 lb 9.6 oz (134.5 kg)   BMI 40.23 kg/m    Wt Readings from Last 3 Encounters:  06/17/17 296 lb 9.6 oz (134.5 kg)  03/12/17 292 lb (132.5 kg)  08/12/16 (!) 304 lb  12.8 oz (138.3 kg)    Physical Exam General - morbidly obese, well-appearing, NAD HEENT - Normocephalic, atraumatic, PERRL, EOMI, patent nares w/o congestion, mild nasopharyngeal edema, oropharynx clear, MMM Neck - supple, non-tender, no LAD, no thyromegaly, no carotid bruit Heart - RRR, no murmurs heard Lungs - Clear throughout all lobes, no wheezing, crackles, or rhonchi. Normal work of breathing. Extremeties - non-tender, no edema, cap refill < 2 seconds, peripheral pulses intact +2 bilaterally, venous varicosities present bilat - R leg w/ protruding veins w/o tenderness.  L leg w/ fewer protruding veins Skin - warm, dry, no rashes Neuro - awake, alert, oriented x3 Psych -  Normal mood and affect, normal behavior    Results for orders placed or performed in visit on 06/17/17  POCT glycosylated hemoglobin (Hb A1C)  Result Value Ref Range   Hemoglobin A1C 6.2       Assessment & Plan:   Problem List Items Addressed This Visit      Cardiovascular and Mediastinum   Benign hypertension    Controlled and at goal on medications.  Pt is tolerating amlodipine-benazepril 10-40 mg once daily w/o side effects.  Plan: 1. Continue amlodipine-benazepril once daily 2. Check BP at home and keep a log. 3. Follow up 6 months.      Relevant Medications   aspirin 81 MG chewable tablet   Varicose veins of right lower extremity    Stable.  Pt w/ persistent varicose veins, but non-palpable and non-tender.  Pt w/ history of spontaneous rupture improved w/ wearing compression socks.  Plan: 1. Continue wearing compression socks.   2. Follow up as needed.      Relevant Medications   aspirin 81 MG chewable tablet     Digestive   Gastro-esophageal reflux disease without esophagitis    Stable. Occasional heartburn likely r/t meals close to bedtime and high fat food choices.  Plan: 1. Continue lansoprazole 15 mg once daily.  Discussed risks vs benefits of long-term PPI use. 2. Continue Tums as needed. 3. Follow up 6 months-1 year.        Other   Morbid obesity with body mass index (BMI) of 40.0 to 44.9 in adult [facility](HCC)    Unstable, steadily increasing weight.  Pt w/ calorie overnutrition.  Plan: 1. Discussed healthy eating choices and eating dinner meal at least 2 hours before sleep. 2. Encouraged calorie reduction w/ healthier choices. 3. Follow up 3 months.      Prediabetes    Unstable w/ elevation of A1c by 0.5% to 6.2%.  Approaching diabetes diagnosis.  Pt not currently on medications and choosing high carb foods.  Plan: 1. Discussed dietary choices and provided low glycemic diet handout.   2. Discussed effect of exercise on blood sugars. 3. Follow up 3  months.  Consider metformin if elevated A1c at that visit.  Pt declines today.      Relevant Orders   POCT glycosylated hemoglobin (Hb A1C) (Completed)    Other Visit Diagnoses    Non-seasonal allergic rhinitis, unspecified trigger    -  Primary Pt w/ year-round allergies w/ complaints of worsening post nasal drip.  Worsening course.  Pt taking Zyrtec and Singulair daily w/o relief.  Plan: 1. Try Allegra or Xyzal instead of Zyrtec.   2. Continue Singulair  3. Take flonase daily for 2 weeks.  Continue up to 8 weeks if improves symptoms. 4. Follow up as needed.   Relevant Medications   levocetirizine (XYZAL) 5 MG tablet  Meds ordered this encounter  Medications  . aspirin 81 MG chewable tablet    Sig: Chew by mouth daily.  Marland Kitchen levocetirizine (XYZAL) 5 MG tablet    Sig: Take 1 tablet (5 mg total) by mouth every evening.    Dispense:  90 tablet    Refill:  3    Order Specific Question:   Supervising Provider    Answer:   Smitty Cords [[id number]]      Follow up plan: Return in about 3 months (around 09/17/2017) for prediabetes.   [name], DNP, AGPCNP-BC Adult Gerontology Primary Care Nurse Practitioner [facility] 06/18/2017, 9:39 PM

## 2017-06-17 NOTE — Patient Instructions (Addendum)
Shaun Patrick, Thank you for coming in to clinic today.  1. For your nasal drainage: - continue your sinus pill  = montelukast  - Stop your over the counter allergy pill (green top) - cetirizine and CHANGE to levocetirizine by prescription or over the counter Xyzal. - If this is not covered, try Allegra or Xyzal. - try Flonase for every day 2 weeks and see if it helps symptoms.  If it isn't taken every day, it doesn't work the same.  Take up to 6 weeks.  2. For your prediabetes: - cut back sugars and carbohydrates.  Try to low glycemic foods on your chart - This starts at the grocery store and at the food menu. - I recommend a nutrition visit if you want more help with your food choices.  Please schedule a follow-up appointment with Shaun Patrick, AGNP to Return in about 3 months (around 09/17/2017) for prediabetes.   If you have any other questions or concerns, please feel free to call the clinic or send a message through MyChart. You may also schedule an earlier appointment if necessary.  Shaun McardleLauren Denna Fryberger, DNP, AGNP-BC Adult Gerontology Nurse Practitioner [facility]

## 2017-06-18 DIAGNOSIS — R7303 Prediabetes: Secondary | ICD-10-CM | POA: Insufficient documentation

## 2017-06-18 NOTE — Assessment & Plan Note (Signed)
Unstable, steadily increasing weight.  Pt w/ calorie overnutrition.  Plan: 1. Discussed healthy eating choices and eating dinner meal at least 2 hours before sleep. 2. Encouraged calorie reduction w/ healthier choices. 3. Follow up 3 months.

## 2017-06-18 NOTE — Assessment & Plan Note (Signed)
Unstable w/ elevation of A1c by 0.5% to 6.2%.  Approaching diabetes diagnosis.  Pt not currently on medications and choosing high carb foods.  Plan: 1. Discussed dietary choices and provided low glycemic diet handout.   2. Discussed effect of exercise on blood sugars. 3. Follow up 3 months.  Consider metformin if elevated A1c at that visit.  Pt declines today.

## 2017-06-18 NOTE — Assessment & Plan Note (Signed)
Stable.  Pt w/ persistent varicose veins, but non-palpable and non-tender.  Pt w/ history of spontaneous rupture improved w/ wearing compression socks.  Plan: 1. Continue wearing compression socks.   2. Follow up as needed.

## 2017-06-18 NOTE — Assessment & Plan Note (Signed)
Controlled and at goal on medications.  Pt is tolerating amlodipine-benazepril 10-40 mg once daily w/o side effects.  Plan: 1. Continue amlodipine-benazepril once daily 2. Check BP at home and keep a log. 3. Follow up 6 months.

## 2017-06-18 NOTE — Assessment & Plan Note (Signed)
Stable. Occasional heartburn likely r/t meals close to bedtime and high fat food choices.  Plan: 1. Continue lansoprazole 15 mg once daily.  Discussed risks vs benefits of long-term PPI use. 2. Continue Tums as needed. 3. Follow up 6 months-1 year.

## 2017-06-19 NOTE — Progress Notes (Signed)
I have reviewed this encounter including the documentation in this note and/or discussed this patient with the provider, Wilhelmina McardleLauren Kennedy, AGPCNP-BC. I am certifying that I agree with the content of this note as supervising physician.  Saralyn PilarAlexander Karamalegos, DO [facility] 06/19/2017, 8:12 AM

## 2017-06-26 DIAGNOSIS — Z96653 Presence of artificial knee joint, bilateral: Secondary | ICD-10-CM | POA: Diagnosis not present

## 2017-09-23 ENCOUNTER — Ambulatory Visit: Payer: BLUE CROSS/BLUE SHIELD | Admitting: Family Medicine

## 2017-10-03 ENCOUNTER — Ambulatory Visit: Payer: BLUE CROSS/BLUE SHIELD | Admitting: Family Medicine

## 2017-11-03 ENCOUNTER — Ambulatory Visit (INDEPENDENT_AMBULATORY_CARE_PROVIDER_SITE_OTHER): Payer: BLUE CROSS/BLUE SHIELD | Admitting: Family Medicine

## 2017-11-03 ENCOUNTER — Encounter: Payer: Self-pay | Admitting: Family Medicine

## 2017-11-03 VITALS — BP 123/62 | HR 76 | Temp 98.9°F | Ht 72.0 in | Wt 296.0 lb

## 2017-11-03 DIAGNOSIS — I10 Essential (primary) hypertension: Secondary | ICD-10-CM | POA: Diagnosis not present

## 2017-11-03 DIAGNOSIS — R7303 Prediabetes: Secondary | ICD-10-CM | POA: Diagnosis not present

## 2017-11-03 DIAGNOSIS — K219 Gastro-esophageal reflux disease without esophagitis: Secondary | ICD-10-CM | POA: Diagnosis not present

## 2017-11-03 LAB — POCT GLYCOSYLATED HEMOGLOBIN (HGB A1C): Hemoglobin A1C: 5.8 — AB (ref ?–5.7)

## 2017-11-03 NOTE — Patient Instructions (Addendum)
Thank you for coming to the clinic today.  1. Will check A1c today and call you with result  2. Keep up good work on improving diet, review handout, and work on improving regular exercise  3. No med changes today  Please schedule a Follow-up Appointment to: Return in about 3 months (around 02/01/2018) for HTN, Pre-DM A1c, GERD.  If you have any other questions or concerns, please feel free to call the clinic or send a message through MyChart. You may also schedule an earlier appointment if necessary.  Additionally, you may be receiving a survey about your experience at our clinic within a few days to 1 week by e-mail or mail. We value your feedback.  Saralyn PilarAlexander Leshawn Straka, DO [facility]

## 2017-11-03 NOTE — Progress Notes (Signed)
Subjective:    Patient ID: Shaun AdamLuther Coleson Jr., male    DOB: 1956/02/12, 61 y.o.   MRN: [id number]  Shaun AdamLuther Mattes Jr. is a 61 y.o. male presenting on 11/03/2017 for Hypertension  Patient's PCP is Wilhelmina McardleLauren Kennedy, AGPCNP-BC [facility]. He was scheduled today with me, uncertain of reason. He is here for routine follow-up and focus on Pre-DM.  HPI   Pre-Diabetes Reports no new concerns today his recent A1c trend has been approx 5.7 to 6.2, last visit had A1c 6.2 on 06/17/17. CBGs: Not checking CBG Meds: Never on medicine Lifestyle: - Diet (limited with works 2nd shift, usually shifted scheduled of eating, tried to eat less sweets but not changed other diet, often still problem with late night snack with larger portions and some regular alcohol with beers)  - Exercise (No change in regular exercise since last visit) Denies hypoglycemia, polyuria, visual changes, numbness or tingling.  CHRONIC HTN: Reports no new concerns. Not checking BP at home. Some readings were checked at work, states these were "normal" Current Meds - Amlodipine-Benazepril 10-40mg  daily Reports good compliance, took meds today. Tolerating well, w/o complaints. Denies CP, dyspnea, HA, edema, dizziness / lightheadedness  Follow-up GERD - Previously discussed with PCP in 03/2017 and 06/2017, in past he was changed from Omeprazole to Lansoprazole now taking 15mg  daily, and overall doing well. He does not notice significant difference, states his GERD symptoms are controlled.  Additional complaint Left low back / flank paresthesia - Reports a localized area of "funny sensation" or "tingling" that does not radiate or cause pain, he was asking if this was his kidney. No known injury or rash.  Depression screen [facility] 2/9 11/03/2017 08/12/2016 04/09/2016  Decreased Interest 0 0 0  Down, Depressed, Hopeless 0 0 0  PHQ - 2 Score 0 0 0    Social History   Tobacco Use  . Smoking status: Former Smoker    Years: 10.00    Types:  Cigarettes    Start date: 12/03/1983    Last attempt to quit: 12/02/1993    Years since quitting: 23.9  . Smokeless tobacco: Never Used  Substance Use Topics  . Alcohol use: Yes    Alcohol/week: 2.4 oz    Types: 4 Cans of beer per week    Comment: beers on the weekend  . Drug use: No    Review of Systems Per HPI unless specifically indicated above     Objective:    BP 123/62   Pulse 76   Temp 98.9 F (37.2 C) (Oral)   Ht 6' (1.829 m)   Wt 296 lb (134.3 kg)   HC 16" (40.6 cm)   BMI 40.14 kg/m   Wt Readings from Last 3 Encounters:  11/03/17 296 lb (134.3 kg)  06/17/17 296 lb 9.6 oz (134.5 kg)  03/12/17 292 lb (132.5 kg)    Physical Exam  Constitutional: He is oriented to person, place, and time. He appears well-developed and well-nourished. No distress.  Well-appearing, comfortable, cooperative, obese  HENT:  Head: Normocephalic and atraumatic.  Mouth/Throat: Oropharynx is clear and moist.  Eyes: Conjunctivae are normal. Right eye exhibits no discharge. Left eye exhibits no discharge.  Neck: Normal range of motion. Neck supple. No thyromegaly present.  Cardiovascular: Normal rate, regular rhythm, normal heart sounds and intact distal pulses.  No murmur heard. Pulmonary/Chest: Effort normal and breath sounds normal. No respiratory distress. He has no wheezes. He has no rales.  Musculoskeletal: Normal range of motion. He exhibits  no edema.  Lymphadenopathy:    He has no cervical adenopathy.  Neurological: He is alert and oriented to person, place, and time.  Skin: Skin is warm and dry. No rash noted. He is not diaphoretic. No erythema.  Psychiatric: He has a normal mood and affect. His behavior is normal.  Well groomed, good eye contact, normal speech and thoughts  Nursing note and vitals reviewed.  Results for orders placed or performed in visit on 11/03/17  POCT glycosylated hemoglobin (Hb A1C)  Result Value Ref Range   Hemoglobin A1C 5.8 (A) 5.7   Recent Labs     03/19/17 0001 06/17/17 1208 11/03/17 1206  HGBA1C 5.7* 6.2 5.8*       Assessment & Plan:   Problem List Items Addressed This Visit    Benign hypertension    Well-controlled HTN - Home BP readings none, except checked at work, normal  No known complications    Plan:  1. Continue current BP regimen Amlodipine-Benazepril 10-40mg  daily 2. Encourage improved lifestyle - low sodium diet, regular exercise 3. Continue monitor BP outside office, bring readings to next visit, if persistently >140/90 or new symptoms notify office sooner 4. Follow-up 3 months      Relevant Medications   tadalafil (CIALIS) 5 MG tablet   Gastro-esophageal reflux disease without esophagitis    Stable controlled on PPI Continue Lansoprazole 15mg  daily      Prediabetes - Primary    Well-controlled Pre-DM with A1c improved to 5.8 from prior 6.2 Concern with obesity, HTN, HLD  Plan:  1. Not on any therapy currently - remain off 2. Encourage improved lifestyle - low carb, low sugar diet, reduce portion size, continue improving regular exercise 3. Follow-up 3 months PreDM A1c with PCP - space out to q 6 mo in future as planned if improved still, and reconsider Metformin if need      Relevant Orders   POCT glycosylated hemoglobin (Hb A1C) (Completed)      No orders of the defined types were placed in this encounter.   Follow up plan: Return in about 3 months (around 02/01/2018) for HTN, Pre-DM A1c, GERD.  Saralyn PilarAlexander Saba Neuman, DO [facility] 11/04/2017, 12:29 AM

## 2017-11-04 NOTE — Assessment & Plan Note (Signed)
Well-controlled HTN - Home BP readings none, except checked at work, normal  No known complications    Plan:  1. Continue current BP regimen Amlodipine-Benazepril 10-40mg  daily 2. Encourage improved lifestyle - low sodium diet, regular exercise 3. Continue monitor BP outside office, bring readings to next visit, if persistently >140/90 or new symptoms notify office sooner 4. Follow-up 3 months

## 2017-11-04 NOTE — Assessment & Plan Note (Signed)
Well-controlled Pre-DM with A1c improved to 5.8 from prior 6.2 Concern with obesity, HTN, HLD  Plan:  1. Not on any therapy currently - remain off 2. Encourage improved lifestyle - low carb, low sugar diet, reduce portion size, continue improving regular exercise 3. Follow-up 3 months PreDM A1c with PCP - space out to q 6 mo in future as planned if improved still, and reconsider Metformin if need

## 2017-11-04 NOTE — Assessment & Plan Note (Signed)
Stable controlled on PPI Continue Lansoprazole 15mg  daily

## 2017-11-19 ENCOUNTER — Other Ambulatory Visit: Payer: Self-pay | Admitting: Nurse Practitioner

## 2017-11-19 DIAGNOSIS — K219 Gastro-esophageal reflux disease without esophagitis: Secondary | ICD-10-CM

## 2017-12-08 ENCOUNTER — Other Ambulatory Visit: Payer: Self-pay | Admitting: Nurse Practitioner

## 2017-12-08 DIAGNOSIS — I10 Essential (primary) hypertension: Secondary | ICD-10-CM

## 2017-12-08 DIAGNOSIS — J3089 Other allergic rhinitis: Secondary | ICD-10-CM

## 2017-12-08 DIAGNOSIS — J302 Other seasonal allergic rhinitis: Secondary | ICD-10-CM

## 2017-12-08 DIAGNOSIS — E785 Hyperlipidemia, unspecified: Secondary | ICD-10-CM

## 2018-02-04 ENCOUNTER — Ambulatory Visit (INDEPENDENT_AMBULATORY_CARE_PROVIDER_SITE_OTHER): Payer: BLUE CROSS/BLUE SHIELD | Admitting: Nurse Practitioner

## 2018-02-04 ENCOUNTER — Encounter: Payer: Self-pay | Admitting: Nurse Practitioner

## 2018-02-04 ENCOUNTER — Other Ambulatory Visit: Payer: Self-pay

## 2018-02-04 VITALS — BP 124/75 | HR 75 | Temp 98.3°F | Ht 72.0 in | Wt 299.4 lb

## 2018-02-04 DIAGNOSIS — L245 Irritant contact dermatitis due to other chemical products: Secondary | ICD-10-CM

## 2018-02-04 DIAGNOSIS — E785 Hyperlipidemia, unspecified: Secondary | ICD-10-CM

## 2018-02-04 DIAGNOSIS — N521 Erectile dysfunction due to diseases classified elsewhere: Secondary | ICD-10-CM

## 2018-02-04 DIAGNOSIS — K219 Gastro-esophageal reflux disease without esophagitis: Secondary | ICD-10-CM | POA: Diagnosis not present

## 2018-02-04 DIAGNOSIS — I10 Essential (primary) hypertension: Secondary | ICD-10-CM

## 2018-02-04 DIAGNOSIS — Z6841 Body Mass Index (BMI) 40.0 and over, adult: Secondary | ICD-10-CM

## 2018-02-04 DIAGNOSIS — J3089 Other allergic rhinitis: Secondary | ICD-10-CM | POA: Diagnosis not present

## 2018-02-04 DIAGNOSIS — J302 Other seasonal allergic rhinitis: Secondary | ICD-10-CM | POA: Diagnosis not present

## 2018-02-04 LAB — LIPID PANEL
Cholesterol: 119 mg/dL (ref <200)
HDL: 33 mg/dL — ABNORMAL LOW (ref >40)
LDL Cholesterol (Calc): 70 mg/dL (calc)
Non-HDL Cholesterol (Calc): 86 mg/dL (calc) (ref <130)
Total CHOL/HDL Ratio: 3.6 (calc) (ref <5.0)
Triglycerides: 78 mg/dL (ref <150)

## 2018-02-04 LAB — COMPLETE METABOLIC PANEL WITH GFR
AG Ratio: 1.4 (calc) (ref 1.0–2.5)
ALT: 19 U/L (ref 9–46)
AST: 19 U/L (ref 10–35)
Albumin: 4 g/dL (ref 3.6–5.1)
Alkaline phosphatase (APISO): 80 U/L (ref 40–115)
BUN: 14 mg/dL (ref 7–25)
CO2: 26 mmol/L (ref 20–32)
Calcium: 9.3 mg/dL (ref 8.6–10.3)
Chloride: 107 mmol/L (ref 98–110)
Creat: 0.97 mg/dL (ref 0.70–1.25)
GFR, Est African American: 97 mL/min/{1.73_m2} (ref >=60)
GFR, Est Non African American: 84 mL/min/{1.73_m2} (ref >=60)
Globulin: 2.9 g/dL (calc) (ref 1.9–3.7)
Glucose, Bld: 103 mg/dL — ABNORMAL HIGH (ref 65–99)
Potassium: 4.7 mmol/L (ref 3.5–5.3)
Sodium: 141 mmol/L (ref 135–146)
Total Bilirubin: 0.3 mg/dL (ref 0.2–1.2)
Total Protein: 6.9 g/dL (ref 6.1–8.1)

## 2018-02-04 MED ORDER — TRIAMCINOLONE ACETONIDE 0.1 % EX CREA: 1.0000 "application " | TOPICAL_CREAM | Freq: Two times a day (BID) | CUTANEOUS | 0 refills | Status: DC

## 2018-02-04 MED ORDER — LEVOCETIRIZINE DIHYDROCHLORIDE 5 MG PO TABS: 5.0000 mg | ORAL_TABLET | Freq: Every evening | ORAL | 3 refills | Status: DC

## 2018-02-04 MED ORDER — AMLODIPINE BESY-BENAZEPRIL HCL 10-40 MG PO CAPS: 1.0000 | ORAL_CAPSULE | Freq: Every day | ORAL | 1 refills | Status: DC

## 2018-02-04 MED ORDER — MONTELUKAST SODIUM 10 MG PO TABS: 10.0000 mg | ORAL_TABLET | Freq: Every day | ORAL | 1 refills | Status: DC

## 2018-02-04 MED ORDER — SILDENAFIL CITRATE 100 MG PO TABS: 50.0000 mg | ORAL_TABLET | Freq: Every day | ORAL | 1 refills | Status: DC | PRN

## 2018-02-04 MED ORDER — ROSUVASTATIN CALCIUM 20 MG PO TABS: 20.0000 mg | ORAL_TABLET | Freq: Every day | ORAL | 1 refills | Status: DC

## 2018-02-04 NOTE — Progress Notes (Signed)
Subjective:    Patient ID: Shaun Kuch., male    DOB: 20-Sep-1956, 62 y.o.   MRN: [id number]  Shaun Kwan. is a 62 y.o. male presenting on 02/04/2018 for Hypertension   HPI Hypertension  - He is not checking BP at home or outside of clinic.    - Current medications: amlodipine-benazepril 10-40 mg once daily, aspirin 81 mg once daily, tolerating well without side effects - He is not currently symptomatic. - Pt denies headache, lightheadedness, dizziness, changes in vision, chest tightness/pressure, palpitations, leg swelling, sudden loss of speech or loss of consciousness. - He  reports no regular exercise routine. - His diet is moderate in salt, high in fat, and high in carbohydrates.   Occupational Exposure - chemical burn Pt with hyperkeratotic edematous lesion with intact skin over R hand from 3-5 proximal phalanx to middle of metacarpals.  Pt notes is not bothersome except for physical appearance.  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  He is offered gloves for protection at work, but had not regularly used them until several weeks ago when the skin irritation occurred. He is now using them daily.  This is reported by pt that it will not be a worker's compensation claim.  Cholesterol Pt reports good toleration of his cholesterol medication without myalgias.  Has regular dietary indiscretions with frequent fried foods, creamy foods, alcohol (2-3 per night and 8-9 each day on weekends), and eating late night meals immediately before bed.   - Pt denies changes in vision, chest tightness/pressure, palpitations, shortness of breath, leg pain while walking, leg or arm weakness, and sudden loss of speech or loss of consciousness.     Erectile Dysfunction Pt reports having taken Cialis and Viagra in past and prefers action of Viagra best.  Would like a refill.   - He reports having no early am erection upon  first awakening as in past. - He has no physical response to stimulation, but maintains intact libido. - Reports no LUTS symptoms that include dribbling, leakage, difficulty starting stream, or frequent urination. - Pt reports he previously required use of 100 mg tablets to obtain erection.  Allergic Rhinitis Pt continues to report having phlegm drainage at posterior oropharynx.  Is taking levocetirizine and montelukast and is tolerating well with mostly good effect.  Would like refills of these medications.  Social History   Tobacco Use  . Smoking status: Former Smoker    Years: 10.00    Types: Cigarettes    Start date: 12/03/1983    Last attempt to quit: 12/02/1993    Years since quitting: 24.1  . Smokeless tobacco: Never Used  Substance Use Topics  . Alcohol use: Yes    Alcohol/week: 2.4 oz    Types: 4 Cans of beer per week    Comment: beers on the weekend  . Drug use: No    Review of Systems Per HPI unless specifically indicated above     Objective:    BP 124/75 (BP Location: Right Arm, Patient Position: Sitting, Cuff Size: Large)   Pulse 75   Temp 98.3 F (36.8 C) (Oral)   Ht 6' (1.829 m)   Wt 299 lb 6.4 oz (135.8 kg)   BMI 40.61 kg/m   Wt Readings from Last 3 Encounters:  02/04/18 299 lb 6.4 oz (135.8 kg)  11/03/17 296 lb (134.3 kg)  06/17/17 296 lb 9.6 oz (134.5 kg)  Physical Exam  General - morbidly obese, well-appearing, NAD HEENT - Normocephalic, atraumatic Neck - supple, non-tender, no LAD, no thyromegaly, no carotid bruit Heart - RRR, no murmurs heard Lungs - Clear throughout all lobes, no wheezing, crackles, or rhonchi. Normal work of breathing. Abdomen - soft, protruding abdomen with central adiposity, NTND, no masses, no hepatosplenomegaly, active bowel sounds  Extremeties - non-tender, no edema, cap refill < 2 seconds, peripheral pulses intact +2 bilaterally Skin - warm, dry Neuro - awake, alert, oriented x3, normal gait Psych - Normal mood and  affect, normal behavior    Results for orders placed or performed in visit on 11/03/17  POCT glycosylated hemoglobin (Hb A1C)  Result Value Ref Range   Hemoglobin A1C 5.8 (A) 5.7      Assessment & Plan:   Problem List Items Addressed This Visit      Cardiovascular and Mediastinum   Benign hypertension - Primary Well controlled hypertension.  BP goal < 130/80.  Pt is reducing alcohol, but is not working on any other lifestyle modifications.  Taking medications tolerating well without side effects. Complicated by morbid obesity.  Normal renal function in past.  Plan: 1. Continue taking amlodipine benazepril 10-40 mg once daily 2. Obtain labs CMP, lipid today.  3. Encouraged heart healthy diet and increasing exercise to 30 minutes most days of the week. 4. Check BP 1-2 x per week at home, keep log, and bring to clinic at next appointment. 5. Follow up 6 months.     Relevant Medications   sildenafil (VIAGRA) 100 MG tablet   rosuvastatin (CRESTOR) 20 MG tablet   amLODipine-benazepril (LOTREL) 10-40 MG capsule   Other Relevant Orders   COMPLETE METABOLIC PANEL WITH GFR   Lipid panel     Respiratory   Perennial allergic rhinitis with seasonal variation Stable with currently mild increase in drainage over last 1-2 months and usually continuing into spring.  Pt is on levocetirizine and montelukast and tolerates well.    Plan: 1. Continue medications without changes. Could consider addition of flonase seasonally to prevent symptoms. 2. Followup as needed.   Relevant Medications   levocetirizine (XYZAL) 5 MG tablet   montelukast (SINGULAIR) 10 MG tablet     Digestive   Gastro-esophageal reflux disease without esophagitis Pt stable on lansoprazole daily.  Pt is tolerating well without side effects.  Desires to continue medication.  No alarm symptoms noted today.     Other   Dyslipidemia Pt with last labs in July 2018.  Due for recheck.  Pt is fasting today. Has tolerated  rosuvastatin well without side effects and no ASCVD events.  Family history significant for early cardiac death in father at age 9.  Plan: 1. Continue rosuvastatin 20 mg once daily. 2. Improve diet and encouraged pt to reduce alcohol. 3. Check Lipid today. 4. Followup 6 months.   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  Pt has previously taken Viagra and Cialis with better response to sildenafil.  Plan: 1. Start sildenafil 50-100 mg once as needed to obtain erection.   Do no repeat within 24 hours. - Avoid all nitrates/nitroglycerin for any chest pain emergencies if using this medication.  Pt verbalizes understanding. - Priapism or having an erection lasting longer than 4 hours is an emergency condition possible when taking this medication.  Seek care in ED if this occurs.  2. Continue to monitor  symptoms.  Notify if worsening. 3. Recommended pt to work on weight loss as that is significant cause of ED.  Can also consider treatment of BPH to improve ED. 4. Followup as needed and with urology referral in future if not responsive to sildenafil.   Relevant Medications   sildenafil (VIAGRA) 100 MG tablet   Morbid obesity with body mass index (BMI) of 40.0 to 44.9 in adult [facility](HCC) Worsening with recent weight gain steadily over last 3 months.  Pt with significant dietary indiscretions and heavy alcohol use. - Comorbid conditions include hypertension, GERD, hyperlipidemia.  Plan: 1. Increase physical activity to 30 minutes most days of the week.  2. Eat healthy diet with lots of fruits, vegetables, and reduce fried foods/salt/sugars. 3. Recommended weight loss of 10-20 lbs in next 3 months. 4. Followup at annual physical in 3 months.    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  Pt has been applying shea butter and had moderate results.  Pt states it appeared to be a burn initially and has improved.  Plan: 1. Start applying triamcinolone cream twice daily for 14 days.  Then, take 7 days off and resume if needed. - Cautioned about skin atrophy with overuse of medication. 2. Followup as needed.  Can consider dermatology referral if no improvement.   Relevant Medications   triamcinolone cream (KENALOG) 0.1 %      Meds ordered this encounter  Medications  . triamcinolone cream (KENALOG) 0.1 %    Sig: Apply 1 application topically 2 (two) times daily.    Dispense:  30 g    Refill:  0    Order Specific Question:   Supervising Provider    Answer:   Smitty CordsKARAMALEGOS, ALEXANDER J [[id number]]  . levocetirizine (XYZAL) 5 MG tablet    Sig: Take 1 tablet (5 mg total) by mouth every evening.    Dispense:  90 tablet    Refill:  3    Order Specific Question:   Supervising Provider    Answer:   Smitty CordsKARAMALEGOS, ALEXANDER J [[id number]]  . sildenafil (VIAGRA) 100 MG tablet    Sig: Take 0.5-1 tablets (50-100 mg total) by mouth daily as needed for erectile dysfunction.    Dispense:  10 tablet    Refill:  1    Order Specific Question:   Supervising Provider    Answer:   Smitty CordsKARAMALEGOS, ALEXANDER J [[id number]]  . montelukast (SINGULAIR) 10 MG tablet    Sig: Take 1 tablet (10 mg total) by mouth daily.    Dispense:  90 tablet    Refill:  1    Order Specific Question:   Supervising Provider    Answer:   Smitty CordsKARAMALEGOS, ALEXANDER J [[id number]]  . rosuvastatin (CRESTOR) 20 MG tablet    Sig: Take 1 tablet (20 mg total) by mouth daily.    Dispense:  90 tablet    Refill:  1    Order Specific Question:   Supervising Provider    Answer:   Smitty CordsKARAMALEGOS, ALEXANDER J [[id number]]  . amLODipine-benazepril (LOTREL) 10-40 MG capsule    Sig: Take 1 capsule by mouth daily.    Dispense:  90 capsule    Refill:  1    Order Specific Question:   Supervising Provider    Answer:   Smitty CordsKARAMALEGOS, ALEXANDER J  [[id number]]    Follow up plan: Return in about 3 months (  around 05/07/2018) for Annual physical AND in 6 months for pre-DM, HTN, cholesterol.  [name], DNP, AGPCNP-BC Adult Gerontology Primary Care Nurse Practitioner [facility] 02/04/2018, 1:32 PM

## 2018-02-04 NOTE — Patient Instructions (Signed)
Shaun BlackwaterLuther, Thank you for coming in to clinic today.  1. Continue all medications without changes.  2. Work to reduce fat, salt and alcohol.  Limit alcohol to 2 beverages per day.  Also limit max to 6 beer per occasion to reduce liver damage.  3. Work toward weight loss of about 10 lbs in the next 3 months.  Please schedule a follow-up appointment with Wilhelmina McardleLauren Omega Slager, AGNP. Return in about 3 months (around 05/07/2018) for Annual physical AND in 6 months for pre-DM, HTN, cholesterol.  If you have any other questions or concerns, please feel free to call the clinic or send a message through MyChart. You may also schedule an earlier appointment if necessary.  You will receive a survey after today's visit either digitally by e-mail or paper by [employer] mail. Your experiences and feedback matter to us.  Please respond so we know how we are doing as we provide care for you.   Wilhelmina McardleLauren Paisli Silfies, DNP, AGNP-BC Adult Gerontology Nurse Practitioner [facility], [facility]

## 2018-02-05 ENCOUNTER — Telehealth: Payer: Self-pay

## 2018-02-05 NOTE — Telephone Encounter (Signed)
Attempted to contact the pt, no answer. LMOM to return my call.  

## 2018-02-05 NOTE — Telephone Encounter (Signed)
-----   Message from Lauren Renee Kennedy, NP sent at 02/05/2018 12:45 PM EST ----- CMP: Your CMP is normal except slightly elevated fasting glucose. Normal electrolytes, kidney function, and liver function.   LIPID: Normal cholesterol with total cholesterol, LDL, and triglycerides at goal.  HDL remains low.  Continue to increase physical activity and work on eating a heart healthy diet.  Continue rosuvastatin 20 mg once daily.  

## 2018-02-06 NOTE — Telephone Encounter (Signed)
The pt was notified. No questions or concerns. 

## 2018-02-06 NOTE — Telephone Encounter (Signed)
-----   Message from Galen ManilaLauren Renee Kennedy, NP sent at 02/05/2018 12:45 PM EST ----- CMP: Your CMP is normal except slightly elevated fasting glucose. Normal electrolytes, kidney function, and liver function.   LIPID: Normal cholesterol with total cholesterol, LDL, and triglycerides at goal.  HDL remains low.  Continue to increase physical activity and work on eating a heart healthy diet.  Continue rosuvastatin 20 mg once daily.

## 2018-02-17 ENCOUNTER — Other Ambulatory Visit: Payer: Self-pay | Admitting: Nurse Practitioner

## 2018-02-17 DIAGNOSIS — K219 Gastro-esophageal reflux disease without esophagitis: Secondary | ICD-10-CM

## 2018-03-03 DIAGNOSIS — H5203 Hypermetropia, bilateral: Secondary | ICD-10-CM | POA: Diagnosis not present

## 2018-05-11 ENCOUNTER — Ambulatory Visit (INDEPENDENT_AMBULATORY_CARE_PROVIDER_SITE_OTHER): Payer: BLUE CROSS/BLUE SHIELD | Admitting: Nurse Practitioner

## 2018-05-11 ENCOUNTER — Other Ambulatory Visit: Payer: Self-pay

## 2018-05-11 ENCOUNTER — Encounter: Payer: Self-pay | Admitting: Nurse Practitioner

## 2018-05-11 VITALS — BP 125/69 | HR 74 | Temp 97.7°F | Ht 72.0 in | Wt 302.6 lb

## 2018-05-11 DIAGNOSIS — Z0000 Encounter for general adult medical examination without abnormal findings: Secondary | ICD-10-CM | POA: Diagnosis not present

## 2018-05-11 DIAGNOSIS — R7303 Prediabetes: Secondary | ICD-10-CM

## 2018-05-11 DIAGNOSIS — Z23 Encounter for immunization: Secondary | ICD-10-CM

## 2018-05-11 DIAGNOSIS — N521 Erectile dysfunction due to diseases classified elsewhere: Secondary | ICD-10-CM

## 2018-05-11 DIAGNOSIS — Z6841 Body Mass Index (BMI) 40.0 and over, adult: Secondary | ICD-10-CM

## 2018-05-11 DIAGNOSIS — Z0001 Encounter for general adult medical examination with abnormal findings: Secondary | ICD-10-CM | POA: Diagnosis not present

## 2018-05-11 DIAGNOSIS — L859 Epidermal thickening, unspecified: Secondary | ICD-10-CM | POA: Diagnosis not present

## 2018-05-11 LAB — POCT GLYCOSYLATED HEMOGLOBIN (HGB A1C): Hemoglobin A1C: 5.9 % — AB (ref 4.0–5.6)

## 2018-05-11 NOTE — Patient Instructions (Addendum)
Shaun AdamLuther Lobos Jr.,   Thank you for coming in to clinic today.  1. Increase your physical activity until you are increasing your heart rate for 30 minutes on most days of the week.  2. Cut back on food or alcohol intake for weight loss.  - Cut back on carbohydrates at bedtime meal.  3. Please call and verify cost of Shingrix shingles vaccine.  If covered, we can give that to you at any time.  You will need two doses about 2-6 months apart.  Please schedule a follow-up appointment with Wilhelmina McardleLauren Annai Heick, AGNP. Return in about 1 year (around 05/12/2019) for annual physical.  AND as scheduled in September for diabetes.  If you have any other questions or concerns, please feel free to call the clinic or send a message through MyChart. You may also schedule an earlier appointment if necessary.  You will receive a survey after today's visit either digitally by e-mail or paper by [employer] mail. Your experiences and feedback matter to us.  Please respond so we know how we are doing as we provide care for you.   Wilhelmina McardleLauren Aysen Shieh, DNP, AGNP-BC Adult Gerontology Nurse Practitioner [facility], [facility]

## 2018-05-11 NOTE — Progress Notes (Signed)
Subjective:    Patient ID: Shaun Teed., male    DOB: 23-Nov-1956, 62 y.o.   MRN: [id number]  Shaun Vollmer. is a 62 y.o. male presenting on 05/11/2018 for Annual Exam   HPI Annual Physical Exam Patient has been feeling well.  They have no acute concerns today. Sleeps 8 hours per night interrupted 2-3 times for urination.  Alcohol consumption: 12 beer q 2 weeks  (6 each day over 2 days)  HEALTH MAINTENANCE: Weight/BMI: steadily increasing Physical activity: none Diet: generally unhealthy with pizza, steaks Seatbelt: always Sunscreen: unusually Prostate exam/PSA: due Colon Cancer Screen: done 2013 HIV/HEP C: due Optometry: annually Dentistry: regular  VACCINES: Tetanus: due Shingles: recommended   Erectile Dysfunction Pt reports overall high incidence of nocturia 2-3 times nightly.  He also has erectile dysfunction.  Pt previously diagnosed with sleep apnea, not currently on CPAP therapy.  Not always able to obtain erection.  Is now causing some relationship dissatisfaction.  Past Medical History:  Diagnosis Date  . GERD (gastroesophageal reflux disease)   . Hypertension    Past Surgical History:  Procedure Laterality Date  . HERNIA REPAIR     umbilical  . JOINT REPLACEMENT Bilateral    knees  . KNEE SURGERY Bilateral    knee replacements  . NASAL SEPTOPLASTY W/ TURBINOPLASTY Bilateral 10/17/2015   Procedure: NASAL SEPTOPLASTY WITH TURBINATE REDUCTION;  Surgeon: Linus Salmons, MD;  Location: [facility] ORS;  Service: ENT;  Laterality: Bilateral;  . NECK SURGERY  1979   broke neck  . TONSILECTOMY, ADENOIDECTOMY, BILATERAL MYRINGOTOMY AND TUBES    . TONSILLECTOMY     Social History   Socioeconomic History  . Marital status: Married    Spouse name: Not on file  . Number of children: Not on file  . Years of education: Not on file  . Highest education level: Not on file  Occupational History  . Occupation: med Games developer: [employer]  Social Needs  . Financial resource strain: Not on file  . Food insecurity:    Worry: Not on file    Inability: Not on file  . Transportation needs:    Medical: Not on file    Non-medical: Not on file  Tobacco Use  . Smoking status: Former Smoker    Years: 10.00    Types: Cigarettes    Start date: 12/03/1983    Last attempt to quit: 12/02/1993    Years since quitting: 24.4  . Smokeless tobacco: Never Used  Substance and Sexual Activity  . Alcohol use: Yes    Alcohol/week: 2.4 oz    Types: 4 Cans of beer per week    Comment: beers on the weekend  . Drug use: No  . Sexual activity: Never  Lifestyle  . Physical activity:    Days per week: Not on file    Minutes per session: Not on file  . Stress: Not on file  Relationships  . Social connections:    Talks on phone: Not on file    Gets together: Not on file    Attends religious service: Not on file    Active member of club or organization: Not on file    Attends meetings of clubs or organizations: Not on file    Relationship status: Not on file  . Intimate partner violence:    Fear of current or ex partner: Not on file    Emotionally abused: Not on file    Physically  abused: Not on file    Forced sexual activity: Not on file  Other Topics Concern  . Not on file  Social History Narrative  . Not on file   Family History  Problem Relation Age of Onset  . Stroke Mother   . Heart disease Mother   . Hypertension Mother   . Liver disease Father   . Hypertension Sister    Current Outpatient Medications on File Prior to Visit  Medication Sig  . amLODipine-benazepril (LOTREL) 10-40 MG capsule Take 1 capsule by mouth daily.  Marland Kitchen aspirin 81 MG chewable tablet Chew by mouth daily.  . lansoprazole (PREVACID) 15 MG capsule TAKE 1 CAPSULE DAILY AT 12 NOON  . levocetirizine (XYZAL) 5 MG tablet Take 1 tablet (5 mg total) by mouth every evening.  . montelukast (SINGULAIR) 10 MG tablet Take 1 tablet (10 mg total) by mouth  daily.  . Multiple Vitamin (MULTI-VITAMINS) TABS Take 1 tablet by mouth 1 day or 1 dose.  . rosuvastatin (CRESTOR) 20 MG tablet Take 1 tablet (20 mg total) by mouth daily.  . saw palmetto 160 MG capsule Take 160 mg by mouth daily.  . sildenafil (VIAGRA) 100 MG tablet Take 0.5-1 tablets (50-100 mg total) by mouth daily as needed for erectile dysfunction. (Patient not taking: Reported on 05/11/2018)   No current facility-administered medications on file prior to visit.     Review of Systems Per HPI unless specifically indicated above     Objective:    BP 125/69 (BP Location: Right Arm, Patient Position: Sitting, Cuff Size: Large)   Pulse 74   Temp 97.7 F (36.5 C) (Oral)   Ht 6' (1.829 m)   Wt (!) 302 lb 9.6 oz (137.3 kg)   BMI 41.04 kg/m   Wt Readings from Last 3 Encounters:  05/11/18 (!) 302 lb 9.6 oz (137.3 kg)  02/04/18 299 lb 6.4 oz (135.8 kg)  11/03/17 296 lb (134.3 kg)    Physical Exam  Constitutional: He is oriented to person, place, and time. He appears well-developed and well-nourished. No distress.  HENT:  Head: Normocephalic and atraumatic.  Right Ear: External ear normal.  Left Ear: External ear normal.  Nose: Nose normal.  Mouth/Throat: Oropharynx is clear and moist.  Eyes: Pupils are equal, round, and reactive to light. Conjunctivae are normal.  Neck: Normal range of motion. Neck supple. No JVD present. Carotid bruit is not present. No tracheal deviation present. No thyromegaly present.  Cardiovascular: Normal rate, regular rhythm, S1 normal, S2 normal, normal heart sounds and intact distal pulses. Exam reveals no gallop and no friction rub.  No murmur heard. Pulmonary/Chest: Effort normal and breath sounds normal. No respiratory distress.  Abdominal: Soft. Bowel sounds are normal. He exhibits no distension. There is no hepatosplenomegaly. There is no tenderness.  Musculoskeletal: Normal range of motion. He exhibits no edema (pedal).  Lymphadenopathy:    He has  no cervical adenopathy.  Neurological: He is alert and oriented to person, place, and time. No cranial nerve deficit.  Skin: Skin is warm and dry. Capillary refill takes less than 2 seconds.     Psychiatric: He has a normal mood and affect. His behavior is normal. Judgment and thought content normal.  Nursing note and vitals reviewed.   Results for orders placed or performed in visit on 05/11/18  POCT glycosylated hemoglobin (Hb A1C)  Result Value Ref Range   Hemoglobin A1C 5.9 (A) 4.0 - 5.6 %   HbA1c, POC (prediabetic range)  5.7 -  6.4 %   HbA1c, POC (controlled diabetic range)  0.0 - 7.0 %      Assessment & Plan:   Problem List Items Addressed This Visit      Other   Erectile dysfunction due to diseases classified elsewhere   Relevant Orders   Ambulatory referral to Urology   Morbid obesity with body mass index (BMI) of 40.0 to 44.9 in adult [facility](HCC)   Prediabetes   Relevant Orders   POCT glycosylated hemoglobin (Hb A1C) (Completed)    Other Visit Diagnoses    Encounter for general adult medical examination with abnormal findings    -  Primary   Relevant Orders   POCT glycosylated hemoglobin (Hb A1C) (Completed)   CBC with Differential/Platelet   COMPLETE METABOLIC PANEL WITH GFR   HIV antibody   Lipid panel   TSH   PSA   Hepatitis C antibody   Need for shingles vaccine       Need for diphtheria-tetanus-pertussis (Tdap) vaccine       Relevant Orders   Tdap vaccine greater than or equal to 7yo IM (Completed)   Hyperkeratosis of skin       Relevant Orders   Ambulatory referral to Dermatology      Physical exam with findings of erectile dysfunction, morbid obesity, and hyperkeratotic skin lesion  Plan: 1. Obtain health maintenance screenings as above according to age. - Increase physical activity to 30 minutes most days of the week.  - Eat healthy diet high in vegetables and fruits; low in refined carbohydrates. - Encouraged weight loss of approximately 30 lbs over  next 1 year.  Reducing alcohol intake important to this success.  Also reduce overall food intake and late night eating habits. 2. Referral for erectile dysfunction to urology given pt allergy to flomax and history of BPH. 3. Referral to dermatology evaluate hyperkeratotic lesion.  Mild concern for possible cancer as it has not always been present. 4. Return 1 year for annual physical AND in September as scheduled.   Follow up plan: Return in about 1 year (around 05/12/2019) for annual physical.  Wilhelmina McardleLauren Sierrah Luevano, DNP, AGPCNP-BC Adult Gerontology Primary Care Nurse Practitioner [facility] 05/11/2018, 1:02 PM

## 2018-05-12 LAB — CBC WITH DIFFERENTIAL/PLATELET
Basophils Absolute: 59 cells/uL (ref 0–200)
Basophils Relative: 0.8 %
Eosinophils Absolute: 89 cells/uL (ref 15–500)
Eosinophils Relative: 1.2 %
HCT: 45.1 % (ref 38.5–50.0)
Hemoglobin: 15.2 g/dL (ref 13.2–17.1)
Lymphs Abs: 2057 cells/uL (ref 850–3900)
MCH: 28.1 pg (ref 27.0–33.0)
MCHC: 33.7 g/dL (ref 32.0–36.0)
MCV: 83.4 fL (ref 80.0–100.0)
MPV: 10.1 fL (ref 7.5–12.5)
Monocytes Relative: 8.2 %
Neutro Abs: 4588 cells/uL (ref 1500–7800)
Neutrophils Relative %: 62 %
Platelets: 245 10*3/uL (ref 140–400)
RBC: 5.41 10*6/uL (ref 4.20–5.80)
RDW: 13.9 % (ref 11.0–15.0)
Total Lymphocyte: 27.8 %
WBC mixed population: 607 cells/uL (ref 200–950)
WBC: 7.4 10*3/uL (ref 3.8–10.8)

## 2018-05-12 LAB — HIV ANTIBODY (ROUTINE TESTING W REFLEX): HIV 1&2 Ab, 4th Generation: NONREACTIVE

## 2018-05-12 LAB — COMPLETE METABOLIC PANEL WITH GFR
AG Ratio: 1.4 (calc) (ref 1.0–2.5)
ALT: 20 U/L (ref 9–46)
AST: 18 U/L (ref 10–35)
Albumin: 4 g/dL (ref 3.6–5.1)
Alkaline phosphatase (APISO): 82 U/L (ref 40–115)
BUN: 14 mg/dL (ref 7–25)
CO2: 28 mmol/L (ref 20–32)
Calcium: 9.1 mg/dL (ref 8.6–10.3)
Chloride: 106 mmol/L (ref 98–110)
Creat: 0.86 mg/dL (ref 0.70–1.25)
GFR, Est African American: 108 mL/min/{1.73_m2} (ref >=60)
GFR, Est Non African American: 94 mL/min/{1.73_m2} (ref >=60)
Globulin: 2.8 g/dL (calc) (ref 1.9–3.7)
Glucose, Bld: 96 mg/dL (ref 65–99)
Potassium: 4.5 mmol/L (ref 3.5–5.3)
Sodium: 140 mmol/L (ref 135–146)
Total Bilirubin: 0.4 mg/dL (ref 0.2–1.2)
Total Protein: 6.8 g/dL (ref 6.1–8.1)

## 2018-05-12 LAB — LIPID PANEL
Cholesterol: 115 mg/dL (ref <200)
HDL: 34 mg/dL — ABNORMAL LOW (ref >40)
LDL Cholesterol (Calc): 65 mg/dL (calc)
Non-HDL Cholesterol (Calc): 81 mg/dL (calc) (ref <130)
Total CHOL/HDL Ratio: 3.4 (calc) (ref <5.0)
Triglycerides: 79 mg/dL (ref <150)

## 2018-05-12 LAB — HEPATITIS C ANTIBODY
Hepatitis C Ab: NONREACTIVE
SIGNAL TO CUT-OFF: 0.27 (ref <1.00)

## 2018-05-12 LAB — PSA: PSA: 2.1 ng/mL (ref <=4.0)

## 2018-05-12 LAB — TSH: TSH: 1.3 mIU/L (ref 0.40–4.50)

## 2018-07-08 ENCOUNTER — Ambulatory Visit: Payer: BLUE CROSS/BLUE SHIELD | Admitting: Urology

## 2018-07-08 ENCOUNTER — Encounter: Payer: Self-pay | Admitting: Urology

## 2018-07-09 ENCOUNTER — Other Ambulatory Visit: Payer: Self-pay | Admitting: Nurse Practitioner

## 2018-07-09 DIAGNOSIS — I10 Essential (primary) hypertension: Secondary | ICD-10-CM

## 2018-07-09 DIAGNOSIS — E785 Hyperlipidemia, unspecified: Secondary | ICD-10-CM

## 2018-07-09 DIAGNOSIS — J3089 Other allergic rhinitis: Secondary | ICD-10-CM

## 2018-07-09 DIAGNOSIS — J302 Other seasonal allergic rhinitis: Secondary | ICD-10-CM

## 2018-07-09 MED ORDER — MONTELUKAST SODIUM 10 MG PO TABS: 10.0000 mg | ORAL_TABLET | Freq: Every day | ORAL | 0 refills | Status: DC

## 2018-07-09 MED ORDER — LEVOCETIRIZINE DIHYDROCHLORIDE 5 MG PO TABS: 5.0000 mg | ORAL_TABLET | Freq: Every evening | ORAL | 0 refills | Status: DC

## 2018-07-09 MED ORDER — ROSUVASTATIN CALCIUM 20 MG PO TABS: 20.0000 mg | ORAL_TABLET | Freq: Every day | ORAL | 0 refills | Status: DC

## 2018-07-09 MED ORDER — AMLODIPINE BESY-BENAZEPRIL HCL 10-40 MG PO CAPS
1.0000 | ORAL_CAPSULE | Freq: Every day | ORAL | 0 refills | Status: DC
Start: 2018-07-09 — End: 2018-10-17

## 2018-07-09 MED ORDER — ASPIRIN 81 MG PO CHEW: 81.0000 mg | CHEWABLE_TABLET | Freq: Every day | ORAL | 0 refills | Status: DC

## 2018-07-09 NOTE — Telephone Encounter (Signed)
Pt. Called requesting refill on  Xyzal 5mg  [facility] ......... Pt also wanted any other medication called in also

## 2018-07-14 ENCOUNTER — Telehealth: Payer: Self-pay | Admitting: Nurse Practitioner

## 2018-07-14 NOTE — Telephone Encounter (Signed)
It is very unlikely this patient will have any of these medications covered.  He should call his pharmacy or insurance and ask if there are any antihistamines that are covered.   He may choose any OTC antihistamine he prefers from the following list:  cetirizine (Zyrtec)  levocetirizine (Xyzal)  fexofenadine (Allegra)  loratadine (Claritin)

## 2018-07-14 NOTE — Telephone Encounter (Signed)
Attempted to contact the pt, no answer. LMOM to return my call.  

## 2018-07-14 NOTE — Telephone Encounter (Signed)
Was told by [facility] medication not covered and wants a different one that is covered to be sent in.  levocetirizine (XYZAL) 5 MG tablet [[id number]][[id number]]

## 2018-07-15 NOTE — Telephone Encounter (Signed)
Attempted to contact the patient again, no answer. LMOM to return my call.  

## 2018-07-15 NOTE — Telephone Encounter (Signed)
The pt was notified. No questions or concerns. 

## 2018-08-10 ENCOUNTER — Ambulatory Visit: Payer: BLUE CROSS/BLUE SHIELD | Admitting: Nurse Practitioner

## 2018-08-30 DIAGNOSIS — J069 Acute upper respiratory infection, unspecified: Secondary | ICD-10-CM | POA: Diagnosis not present

## 2018-09-03 ENCOUNTER — Ambulatory Visit (INDEPENDENT_AMBULATORY_CARE_PROVIDER_SITE_OTHER): Payer: BLUE CROSS/BLUE SHIELD | Admitting: Nurse Practitioner

## 2018-09-03 ENCOUNTER — Other Ambulatory Visit: Payer: Self-pay

## 2018-09-03 ENCOUNTER — Encounter: Payer: Self-pay | Admitting: Nurse Practitioner

## 2018-09-03 VITALS — BP 133/66 | HR 108 | Temp 98.1°F | Resp 17 | Ht 72.0 in | Wt 300.0 lb

## 2018-09-03 DIAGNOSIS — J019 Acute sinusitis, unspecified: Secondary | ICD-10-CM

## 2018-09-03 DIAGNOSIS — B9689 Other specified bacterial agents as the cause of diseases classified elsewhere: Secondary | ICD-10-CM

## 2018-09-03 DIAGNOSIS — H65192 Other acute nonsuppurative otitis media, left ear: Secondary | ICD-10-CM | POA: Diagnosis not present

## 2018-09-03 MED ORDER — AMOXICILLIN-POT CLAVULANATE 875-125 MG PO TABS: 1.0000 | ORAL_TABLET | Freq: Two times a day (BID) | ORAL | 0 refills | Status: AC

## 2018-09-03 NOTE — Patient Instructions (Addendum)
Shaun Adam.,   Thank you for coming in to clinic today.  1. It sounds like you have an Upper Respiratory Bacterial infection.  Recommend good hand washing.  Left ear infection and sinusitis. - START Augmentin 875-125 mg one tablet every 12 hours for 10 days.  Make sure to take all doses of your antibiotic. - While you are on an antibiotic, take a probiotic.  Antibiotics kill good and bad bacteria.  A probiotic helps to replace your good bacteria. Probiotic pills can be found over the counter.  One brand is Florastor, but you can use any brand you prefer.  You can also get good bacteria from foods.  These foods are yogurt, kefir, kombucha, and fresh, refrigerated and uncooked sauerkraut. - Drink plenty of fluids.  - Start anti-histamine loratadine or cetirizine 10mg  daily. - If congestion is worse, start OTC Mucinex (or may try Mucinex-DM for cough) up to 7-10 days then stop  Other over the counter medications you may try, if needed for symptoms are: - You may try over the counter Nasal Saline spray (Simply Saline, Ocean Spray) as needed to reduce congestion. - Start taking Tylenol extra strength 1 to 2 tablets every 6-8 hours for aches or fever/chills for next few days as needed.  Do not take more than 3,000 mg in 24 hours from all medicines.   - Drink warm herbal tea with honey for sore throat.   If symptoms are significantly worse with persistent fevers/chills despite tylenol/ibpurofen, nausea, vomiting unable to tolerate food/fluids or medicine, body aches, or shortness of breath, sinus pain pressure or worsening productive cough, then follow-up for re-evaluation, may seek more immediate care at Urgent Care or the ED if you are more concerned that it is an emergency.  Please schedule a follow-up appointment with [name], AGNP. Return 5-7 days if symptoms worsen or fail to improve.  If you have any other questions or concerns, please feel free to call the clinic or send a message  through MyChart. You may also schedule an earlier appointment if necessary.  You will receive a survey after today's visit either digitally by e-mail or paper by [employer]. Your experiences and feedback matter to [place].  Please respond so we know how we are doing as we provide care for you.   [name], DNP, AGNP-BC Adult Gerontology Nurse Practitioner [facility]

## 2018-09-03 NOTE — Progress Notes (Signed)
Subjective:    Patient ID: Shaun Patrick., male    DOB: 08-Sep-1956, 62 y.o.   MRN: [id number]  Shaun Patrick. is a 62 y.o. male presenting on 09/03/2018 for URI (chest congestion, coughing, , insomnia, scratchy throat and fatigue x 1 week )   HPI URI symptoms Started Fri night at work.  Scratchy throat, sneezing.  Saturday, Congestion/cough.  Sunday felt very bad with malaise, dizziness, chest discomfort, difficulty breathing.  Has been out of work all week.  Yesterday, his work sent him home and back to doc since he was still sick. He also endorses mild headache, sinus pressure, sweats, but no known fever. Cough is improving some.  Chest congestion is more bothersome, but notes that it is "trying to break up little by little." - denies ear pressure/pain/fullness, fever, chills, nausea, vomiting, or diarrhea, - Is taking dayquil to help control symptoms occasionally, but not round-the-clock.   Social History   Tobacco Use  . Smoking status: Former Smoker    Years: 10.00    Types: Cigarettes    Start date: 12/03/1983    Last attempt to quit: 12/02/1993    Years since quitting: 24.7  . Smokeless tobacco: Never Used  Substance Use Topics  . Alcohol use: Yes    Alcohol/week: 4.0 standard drinks    Types: 4 Cans of beer per week    Comment: beers on the weekend  . Drug use: No    Review of Systems Per HPI unless specifically indicated above     Objective:    BP 133/66 (BP Location: Left Arm, Patient Position: Sitting, Cuff Size: Normal)   Pulse (!) 108   Temp 98.1 F (36.7 C) (Oral)   Resp 17   Ht 6' (1.829 m)   Wt 300 lb (136.1 kg)   SpO2 98%   BMI 40.69 kg/m   Wt Readings from Last 3 Encounters:  09/03/18 300 lb (136.1 kg)  05/11/18 (!) 302 lb 9.6 oz (137.3 kg)  02/04/18 299 lb 6.4 oz (135.8 kg)    Physical Exam  Constitutional: He appears well-developed and well-nourished.  HENT:  Head: Normocephalic and atraumatic.  Right Ear: Hearing, external ear and ear  canal normal. Tympanic membrane is erythematous. Tympanic membrane is not retracted and not bulging.  Left Ear: Hearing, external ear and ear canal normal. Tympanic membrane is erythematous and bulging. Tympanic membrane is not retracted.  Nose: Mucosal edema and rhinorrhea present. Right sinus exhibits maxillary sinus tenderness and frontal sinus tenderness. Left sinus exhibits maxillary sinus tenderness and frontal sinus tenderness.  Mouth/Throat: Uvula is midline and mucous membranes are normal. Posterior oropharyngeal edema (cobblestoning) present. Oropharyngeal exudate: clear secretions.  Eyes: Pupils are equal, round, and reactive to light. Conjunctivae and EOM are normal. Right eye exhibits no discharge. Left eye exhibits no discharge.  Neck: Normal range of motion. Neck supple.  Cardiovascular: Normal rate, regular rhythm, S1 normal, S2 normal, normal heart sounds and intact distal pulses.  Pulmonary/Chest: Effort normal and breath sounds normal. No respiratory distress.  Lymphadenopathy:    He has cervical adenopathy.  Neurological: He is alert.  Skin: Skin is warm and dry. Capillary refill takes less than 2 seconds.  Psychiatric: He has a normal mood and affect. His behavior is normal. Judgment and thought content normal.  Vitals reviewed.   Results for orders placed or performed in visit on 05/11/18  CBC with Differential/Platelet  Result Value Ref Range   WBC 7.4 3.8 - 10.8 Thousand/uL  RBC 5.41 4.20 - 5.80 Million/uL   Hemoglobin 15.2 13.2 - 17.1 g/dL   HCT 16.1 09.6 - 04.5 %   MCV 83.4 80.0 - 100.0 fL   MCH 28.1 27.0 - 33.0 pg   MCHC 33.7 32.0 - 36.0 g/dL   RDW 40.9 81.1 - 91.4 %   Platelets 245 140 - 400 Thousand/uL   MPV 10.1 7.5 - 12.5 fL   Neutro Abs 4,588 1,500 - 7,800 cells/uL   Lymphs Abs 2,057 850 - 3,900 cells/uL   WBC mixed population 607 200 - 950 cells/uL   Eosinophils Absolute 89 15 - 500 cells/uL   Basophils Absolute 59 0 - 200 cells/uL   Neutrophils  Relative % 62 %   Total Lymphocyte 27.8 %   Monocytes Relative 8.2 %   Eosinophils Relative 1.2 %   Basophils Relative 0.8 %  COMPLETE METABOLIC PANEL WITH GFR  Result Value Ref Range   Glucose, Bld 96 65 - 99 mg/dL   BUN 14 7 - 25 mg/dL   Creat 7.82 9.56 - 2.13 mg/dL   GFR, Est Non African American 94 > OR = 60 mL/min/1.36m2   GFR, Est African American 108 > OR = 60 mL/min/1.7m2   BUN/Creatinine Ratio NOT APPLICABLE 6 - 22 (calc)   Sodium 140 135 - 146 mmol/L   Potassium 4.5 3.5 - 5.3 mmol/L   Chloride 106 98 - 110 mmol/L   CO2 28 20 - 32 mmol/L   Calcium 9.1 8.6 - 10.3 mg/dL   Total Protein 6.8 6.1 - 8.1 g/dL   Albumin 4.0 3.6 - 5.1 g/dL   Globulin 2.8 1.9 - 3.7 g/dL (calc)   AG Ratio 1.4 1.0 - 2.5 (calc)   Total Bilirubin 0.4 0.2 - 1.2 mg/dL   Alkaline phosphatase (APISO) 82 40 - 115 U/L   AST 18 10 - 35 U/L   ALT 20 9 - 46 U/L  HIV antibody  Result Value Ref Range   HIV 1&2 Ab, 4th Generation NON-REACTIVE NON-REACTI  Lipid panel  Result Value Ref Range   Cholesterol 115 <200 mg/dL   HDL 34 (L) >08 mg/dL   Triglycerides 79 <657 mg/dL   LDL Cholesterol (Calc) 65 mg/dL (calc)   Total CHOL/HDL Ratio 3.4 <5.0 (calc)   Non-HDL Cholesterol (Calc) 81 <846 mg/dL (calc)  TSH  Result Value Ref Range   TSH 1.30 0.40 - 4.50 mIU/L  PSA  Result Value Ref Range   PSA 2.1 < OR = 4.0 ng/mL  Hepatitis C antibody  Result Value Ref Range   Hepatitis C Ab NON-REACTIVE NON-REACTI   SIGNAL TO CUT-OFF 0.27 <1.00  POCT glycosylated hemoglobin (Hb A1C)  Result Value Ref Range   Hemoglobin A1C 5.9 (A) 4.0 - 5.6 %   HbA1c, POC (prediabetic range)  5.7 - 6.4 %   HbA1c, POC (controlled diabetic range)  0.0 - 7.0 %      Assessment & Plan:   Problem List Items Addressed This Visit    None    Visit Diagnoses    Other non-recurrent acute nonsuppurative otitis media of left ear    -  Primary   Relevant Medications   amoxicillin-clavulanate (AUGMENTIN) 875-125 MG tablet   Acute  bacterial rhinosinusitis       Relevant Medications   benzonatate (TESSALON) 200 MG capsule   amoxicillin-clavulanate (AUGMENTIN) 875-125 MG tablet    Consistent with URI and secondary sinusitis and LEFT aom with symptoms worsening over the past 2 days and initial  symptoms of nasal congestion and sinus pressure over 7 days ago.   Plan: 1.START taking Autmentin 875-125 mg tablets every 12 hours for 10 days.  Discussed completing antibiotic. - While on antibiotic, take a probiotic OTC or from food. - Start Atrovent nasal spray decongestant 2 sprays each nostril up to 4 times daily for 5-7 days - Continue anti-histamine loratadine or cetirizine 10mg  daily. - Can use Flonase 2 sprays each nostril daily for up to 4-6 weeks if no epistaxis. - Start Mucinex-DM OTC for  7-10 days prn congestion 2. Supportive care with nasal saline, warm herbal tea with honey, 3. Improve hydration 4. Tylenol / Motrin PRN fevers  5. Return criteria given    Meds ordered this encounter  Medications  . amoxicillin-clavulanate (AUGMENTIN) 875-125 MG tablet    Sig: Take 1 tablet by mouth 2 (two) times daily for 10 days.    Dispense:  20 tablet    Refill:  0    Order Specific Question:   Supervising Provider    Answer:   Smitty Cords [[id number]]    Follow up plan: Return 5-7 days if symptoms worsen or fail to improve.  [name], DNP, AGPCNP-BC Adult Gerontology Primary Care Nurse Practitioner [facility] 09/03/2018, 3:14 PM

## 2018-09-04 ENCOUNTER — Encounter: Payer: Self-pay | Admitting: Nurse Practitioner

## 2018-09-20 ENCOUNTER — Other Ambulatory Visit: Payer: Self-pay | Admitting: Nurse Practitioner

## 2018-09-20 DIAGNOSIS — I10 Essential (primary) hypertension: Secondary | ICD-10-CM

## 2018-10-08 ENCOUNTER — Other Ambulatory Visit: Payer: Self-pay | Admitting: Nurse Practitioner

## 2018-10-08 DIAGNOSIS — J3089 Other allergic rhinitis: Secondary | ICD-10-CM

## 2018-10-08 DIAGNOSIS — J302 Other seasonal allergic rhinitis: Secondary | ICD-10-CM

## 2018-10-17 ENCOUNTER — Other Ambulatory Visit: Payer: Self-pay | Admitting: Nurse Practitioner

## 2018-10-17 DIAGNOSIS — I10 Essential (primary) hypertension: Secondary | ICD-10-CM

## 2018-10-17 DIAGNOSIS — E785 Hyperlipidemia, unspecified: Secondary | ICD-10-CM

## 2018-12-25 DIAGNOSIS — Z96653 Presence of artificial knee joint, bilateral: Secondary | ICD-10-CM | POA: Diagnosis not present

## 2018-12-25 DIAGNOSIS — M170 Bilateral primary osteoarthritis of knee: Secondary | ICD-10-CM | POA: Diagnosis not present

## 2019-01-18 ENCOUNTER — Other Ambulatory Visit: Payer: Self-pay | Admitting: Nurse Practitioner

## 2019-01-18 DIAGNOSIS — E785 Hyperlipidemia, unspecified: Secondary | ICD-10-CM

## 2019-01-19 ENCOUNTER — Other Ambulatory Visit: Payer: Self-pay | Admitting: Nurse Practitioner

## 2019-01-19 DIAGNOSIS — I10 Essential (primary) hypertension: Secondary | ICD-10-CM

## 2019-02-12 ENCOUNTER — Other Ambulatory Visit: Payer: Self-pay | Admitting: Nurse Practitioner

## 2019-02-12 DIAGNOSIS — K219 Gastro-esophageal reflux disease without esophagitis: Secondary | ICD-10-CM

## 2019-02-12 NOTE — Telephone Encounter (Signed)
Pharmacy requesting refills. Thanks!  

## 2019-03-02 DIAGNOSIS — M1612 Unilateral primary osteoarthritis, left hip: Secondary | ICD-10-CM | POA: Diagnosis not present

## 2019-03-02 DIAGNOSIS — M25552 Pain in left hip: Secondary | ICD-10-CM | POA: Diagnosis not present

## 2019-05-13 ENCOUNTER — Other Ambulatory Visit: Payer: Self-pay | Admitting: Nurse Practitioner

## 2019-05-13 DIAGNOSIS — E785 Hyperlipidemia, unspecified: Secondary | ICD-10-CM

## 2019-05-13 NOTE — Telephone Encounter (Signed)
Pt  Called requesting refill on crestor  Called into  [facility]

## 2019-05-13 NOTE — Addendum Note (Signed)
Addended by: Wilson Singer on: 05/13/2019 12:21 PM   Modules accepted: Orders

## 2019-05-14 MED ORDER — ROSUVASTATIN CALCIUM 20 MG PO TABS: 20.0000 mg | ORAL_TABLET | Freq: Every day | ORAL | 0 refills | Status: DC

## 2019-05-26 ENCOUNTER — Other Ambulatory Visit: Payer: Self-pay

## 2019-05-26 DIAGNOSIS — K219 Gastro-esophageal reflux disease without esophagitis: Secondary | ICD-10-CM

## 2019-05-26 DIAGNOSIS — J302 Other seasonal allergic rhinitis: Secondary | ICD-10-CM

## 2019-05-26 DIAGNOSIS — J3089 Other allergic rhinitis: Secondary | ICD-10-CM

## 2019-05-26 DIAGNOSIS — E785 Hyperlipidemia, unspecified: Secondary | ICD-10-CM

## 2019-05-26 DIAGNOSIS — I10 Essential (primary) hypertension: Secondary | ICD-10-CM

## 2019-05-26 MED ORDER — ASPIRIN 81 MG PO CHEW: CHEWABLE_TABLET | ORAL | 4 refills | Status: AC

## 2019-05-26 MED ORDER — AMLODIPINE BESY-BENAZEPRIL HCL 10-40 MG PO CAPS: 1.0000 | ORAL_CAPSULE | Freq: Every day | ORAL | 0 refills | Status: DC

## 2019-05-26 MED ORDER — LANSOPRAZOLE 15 MG PO CPDR: 15.0000 mg | DELAYED_RELEASE_CAPSULE | Freq: Every day | ORAL | 0 refills | Status: DC

## 2019-05-26 MED ORDER — MONTELUKAST SODIUM 10 MG PO TABS: 10.0000 mg | ORAL_TABLET | Freq: Every day | ORAL | 4 refills | Status: DC

## 2019-05-26 MED ORDER — ROSUVASTATIN CALCIUM 20 MG PO TABS: 20.0000 mg | ORAL_TABLET | Freq: Every day | ORAL | 0 refills | Status: DC

## 2019-09-13 ENCOUNTER — Other Ambulatory Visit: Payer: Self-pay | Admitting: Family Medicine

## 2019-09-13 DIAGNOSIS — I10 Essential (primary) hypertension: Secondary | ICD-10-CM

## 2019-09-13 DIAGNOSIS — E785 Hyperlipidemia, unspecified: Secondary | ICD-10-CM

## 2019-09-14 ENCOUNTER — Other Ambulatory Visit: Payer: Self-pay | Admitting: Family Medicine

## 2019-09-14 DIAGNOSIS — E785 Hyperlipidemia, unspecified: Secondary | ICD-10-CM

## 2019-09-14 DIAGNOSIS — I10 Essential (primary) hypertension: Secondary | ICD-10-CM

## 2019-09-15 ENCOUNTER — Other Ambulatory Visit: Payer: Self-pay

## 2019-09-15 ENCOUNTER — Ambulatory Visit (INDEPENDENT_AMBULATORY_CARE_PROVIDER_SITE_OTHER): Payer: BLUE CROSS/BLUE SHIELD | Admitting: Nurse Practitioner

## 2019-09-15 ENCOUNTER — Encounter: Payer: Self-pay | Admitting: Nurse Practitioner

## 2019-09-15 DIAGNOSIS — K219 Gastro-esophageal reflux disease without esophagitis: Secondary | ICD-10-CM | POA: Diagnosis not present

## 2019-09-15 DIAGNOSIS — J302 Other seasonal allergic rhinitis: Secondary | ICD-10-CM

## 2019-09-15 DIAGNOSIS — N521 Erectile dysfunction due to diseases classified elsewhere: Secondary | ICD-10-CM | POA: Diagnosis not present

## 2019-09-15 DIAGNOSIS — E785 Hyperlipidemia, unspecified: Secondary | ICD-10-CM

## 2019-09-15 DIAGNOSIS — I10 Essential (primary) hypertension: Secondary | ICD-10-CM | POA: Diagnosis not present

## 2019-09-15 DIAGNOSIS — J3089 Other allergic rhinitis: Secondary | ICD-10-CM

## 2019-09-15 DIAGNOSIS — R7303 Prediabetes: Secondary | ICD-10-CM

## 2019-09-15 MED ORDER — LANSOPRAZOLE 15 MG PO CPDR: 15.0000 mg | DELAYED_RELEASE_CAPSULE | Freq: Every day | ORAL | 1 refills | Status: DC

## 2019-09-15 MED ORDER — AMLODIPINE BESY-BENAZEPRIL HCL 10-40 MG PO CAPS: 1.0000 | ORAL_CAPSULE | Freq: Every day | ORAL | 0 refills | Status: DC

## 2019-09-15 MED ORDER — AMLODIPINE BESY-BENAZEPRIL HCL 10-40 MG PO CAPS: 1.0000 | ORAL_CAPSULE | Freq: Every day | ORAL | 1 refills | Status: DC

## 2019-09-15 MED ORDER — SILDENAFIL CITRATE 100 MG PO TABS: 50.0000 mg | ORAL_TABLET | Freq: Every day | ORAL | 1 refills | Status: DC | PRN

## 2019-09-15 MED ORDER — ROSUVASTATIN CALCIUM 20 MG PO TABS: 20.0000 mg | ORAL_TABLET | Freq: Every day | ORAL | 1 refills | Status: DC

## 2019-09-15 MED ORDER — LANSOPRAZOLE 15 MG PO CPDR: 15.0000 mg | DELAYED_RELEASE_CAPSULE | Freq: Every day | ORAL | 0 refills | Status: DC

## 2019-09-15 MED ORDER — ROSUVASTATIN CALCIUM 20 MG PO TABS: 20.0000 mg | ORAL_TABLET | Freq: Every day | ORAL | 0 refills | Status: DC

## 2019-09-15 NOTE — Progress Notes (Signed)
Telemedicine Encounter: Disclosed to patient at start of encounter that we will provide appropriate telemedicine services.  Patient consents to be treated via phone prior to discussion. - Patient is at his home and is accessed via telephone. - Services are provided by Cassell Smiles from [facility].  Subjective:    Patient ID: Shaun Patrick., male    DOB: 1956/01/14, 63 y.o.   MRN: [id number]  Ariyan Brisendine. is a 63 y.o. male presenting on 09/15/2019 for Hypertension  HPI Hypertension - He is not checking BP at home or outside of clinic.  Pt has a blood pressure machine, but is not checking.  - Current medications: Amlodipine-benazepril 10-40 mg once daily, tolerating well without side effects - He is not currently symptomatic. - Pt denies headache, lightheadedness, dizziness, changes in vision, chest tightness/pressure, palpitations, leg swelling, sudden loss of speech or loss of consciousness. - He  reports no regular exercise routine. - His diet is moderate in salt, moderate in fat, and moderate in carbohydrates.   Dyslipidemia -Patient is currently taking rosuvastatin 20 mg once daily.  Patient is tolerating without side effects. -Pt denies changes in vision, chest tightness/pressure, palpitations, shortness of breath, leg pain while walking, leg or arm weakness, and sudden loss of speech or loss of consciousness.    Depression screen/social isolation Patient is now retired.  Patient is not used to being bored.  Denies depression, anhedonia, SI/HI.  GERD Continues on lansoprazole 15 mg once daily without heartburn.  Patient notes some weight gain about 8-9 lbs over 6 months time.  Weight gain has slowed as he got  - Beer cut back as well.  Seasonal allergies More post nasal drainage.  Is continuing on OTC antihistamine without relief.  Patient has not taken montelukast in the past.  Social History   Tobacco Use  . Smoking status: Former Smoker    Years:  10.00    Types: Cigarettes    Start date: 12/03/1983    Quit date: 12/02/1993    Years since quitting: 25.8  . Smokeless tobacco: Never Used  Substance Use Topics  . Alcohol use: Yes    Alcohol/week: 4.0 standard drinks    Types: 4 Cans of beer per week    Comment: beers on the weekend  . Drug use: No    Review of Systems Per HPI unless specifically indicated above     Objective:    There were no vitals taken for this visit.  Wt Readings from Last 3 Encounters:  09/03/18 300 lb (136.1 kg)  05/11/18 (!) 302 lb 9.6 oz (137.3 kg)  02/04/18 299 lb 6.4 oz (135.8 kg)    Physical Exam Patient remotely monitored.  Verbal communication appropriate.  Cognition normal.   Results for orders placed or performed in visit on 09/15/19  Hemoglobin A1c  Result Value Ref Range   Hgb A1c MFr Bld 6.1 (H) <5.7 % of total Hgb   Mean Plasma Glucose 128 (calc)   eAG (mmol/L) 7.1 (calc)  Comprehensive metabolic panel  Result Value Ref Range   Glucose, Bld 114 (H) 65 - 99 mg/dL   BUN 11 7 - 25 mg/dL   Creat 0.83 0.70 - 1.25 mg/dL   BUN/Creatinine Ratio NOT APPLICABLE 6 - 22 (calc)   Sodium 139 135 - 146 mmol/L   Potassium 4.5 3.5 - 5.3 mmol/L   Chloride 101 98 - 110 mmol/L   CO2 28 20 - 32 mmol/L   Calcium 9.7 8.6 -  10.3 mg/dL   Total Protein 6.6 6.1 - 8.1 g/dL   Albumin 4.0 3.6 - 5.1 g/dL   Globulin 2.6 1.9 - 3.7 g/dL (calc)   AG Ratio 1.5 1.0 - 2.5 (calc)   Total Bilirubin 0.5 0.2 - 1.2 mg/dL   Alkaline phosphatase (APISO) 81 35 - 144 U/L   AST 18 10 - 35 U/L   ALT 23 9 - 46 U/L  Lipid panel  Result Value Ref Range   Cholesterol 141 <200 mg/dL   HDL 31 (L) > OR = 40 mg/dL   Triglycerides 295143 <284<150 mg/dL   LDL Cholesterol (Calc) 86 mg/dL (calc)   Total CHOL/HDL Ratio 4.5 <5.0 (calc)   Non-HDL Cholesterol (Calc) 110 <130 mg/dL (calc)  CBC with Differential/Platelet  Result Value Ref Range   WBC 8.6 3.8 - 10.8 Thousand/uL   RBC 5.34 4.20 - 5.80 Million/uL   Hemoglobin 15.3 13.2 -  17.1 g/dL   HCT 13.246.1 44.038.5 - 10.250.0 %   MCV 86.3 80.0 - 100.0 fL   MCH 28.7 27.0 - 33.0 pg   MCHC 33.2 32.0 - 36.0 g/dL   RDW 72.513.6 36.611.0 - 44.015.0 %   Platelets 309 140 - 400 Thousand/uL   MPV 10.0 7.5 - 12.5 fL   Neutro Abs 5,255 1,500 - 7,800 cells/uL   Lymphs Abs 2,657 850 - 3,900 cells/uL   Absolute Monocytes 533 200 - 950 cells/uL   Eosinophils Absolute 103 15 - 500 cells/uL   Basophils Absolute 52 0 - 200 cells/uL   Neutrophils Relative % 61.1 %   Total Lymphocyte 30.9 %   Monocytes Relative 6.2 %   Eosinophils Relative 1.2 %   Basophils Relative 0.6 %      Assessment & Plan:   Problem List Items Addressed This Visit      Cardiovascular and Mediastinum   Benign hypertension - Primary Stable symptoms on telephone evaluation today.  Continue amlodipine-benazepril without changes.  Labs ordered for collection in the next week.  Continue to advise patient using caution with sildenafil if having cardiac emergency. recommend medical alert bracelet or other jewelry/wallet card.  Encourage patient to continue DASH diet.  Follow-up 6 months.   Relevant Medications   sildenafil (VIAGRA) 100 MG tablet   rosuvastatin (CRESTOR) 20 MG tablet   amLODipine-benazepril (LOTREL) 10-40 MG capsule   Other Relevant Orders   Comprehensive metabolic panel (Completed)     Respiratory   Perennial allergic rhinitis with seasonal variation Currently unstable on OTC medications.  Start montelukast 10 mg once daily.  Follow-up as needed     Other   Dyslipidemia Stable today on exam.  Medications tolerated without side effects.  Continue at current doses.  Refills provided.  Check labs today. Followup 6 months.    Relevant Medications   rosuvastatin (CRESTOR) 20 MG tablet   Other Relevant Orders   Comprehensive metabolic panel (Completed)   Lipid panel (Completed)   Erectile dysfunction due to diseases classified elsewhere Stable and well-controlled use of sildenafil.  Patient requests refills today.   Refills provided.  Caution use of nitrates and cardiac emergency.  Follow-up as needed   Relevant Medications   sildenafil (VIAGRA) 100 MG tablet   Prediabetes Status unknown.  Recheck labs.  Continue meds without changes today.  Refills provided. Followup prn after labs.    Relevant Orders   Hemoglobin A1c (Completed)   Comprehensive metabolic panel (Completed)    Other Visit Diagnoses    Gastroesophageal reflux disease  Stable today on exam without breakthrough heartburn or bleeding complications.  Medications tolerated without side effects.  Continue at current doses.  Refills provided.  Check labs today. Followup 3 months.    Relevant Medications   lansoprazole (PREVACID) 15 MG capsule   Other Relevant Orders   Comprehensive metabolic panel (Completed)   CBC with Differential/Platelet (Completed)      Meds ordered this encounter  Medications  . DISCONTD: amLODipine-benazepril (LOTREL) 10-40 MG capsule    Sig: Take 1 capsule by mouth daily.    Dispense:  90 capsule    Refill:  0       . DISCONTD: rosuvastatin (CRESTOR) 20 MG tablet    Sig: Take 1 tablet (20 mg total) by mouth daily.    Dispense:  90 tablet    Refill:  0       . DISCONTD: sildenafil (VIAGRA) 100 MG tablet    Sig: Take 0.5-1 tablets (50-100 mg total) by mouth daily as needed for erectile dysfunction.    Dispense:  10 tablet    Refill:  1         Order Specific Question:   Supervising Provider    Answer:   Smitty Cords [[id number]]  . DISCONTD: lansoprazole (PREVACID) 15 MG capsule    Sig: Take 1 capsule (15 mg total) by mouth daily.    Dispense:  90 capsule    Refill:  0         Order Specific Question:   Supervising Provider    Answer:   Smitty Cords [[id number]]  . sildenafil (VIAGRA) 100 MG tablet    Sig: Take 0.5-1 tablets (50-100 mg total) by mouth daily as needed for erectile dysfunction.    Dispense:  10 tablet    Refill:  1    Order Specific Question:   Supervising Provider     Answer:   Smitty Cords [[id number]]  . rosuvastatin (CRESTOR) 20 MG tablet    Sig: Take 1 tablet (20 mg total) by mouth daily.    Dispense:  90 tablet    Refill:  1    Order Specific Question:   Supervising Provider    Answer:   Smitty Cords [[id number]]  . lansoprazole (PREVACID) 15 MG capsule    Sig: Take 1 capsule (15 mg total) by mouth daily.    Dispense:  90 capsule    Refill:  1    Order Specific Question:   Supervising Provider    Answer:   Smitty Cords [[id number]]  . amLODipine-benazepril (LOTREL) 10-40 MG capsule    Sig: Take 1 capsule by mouth daily.    Dispense:  90 capsule    Refill:  1    Order Specific Question:   Supervising Provider    Answer:   Smitty Cords [[id number]]    - Time spent in direct consultation with patient via telemedicine about above concerns: 14 minutes  Follow up plan: Return in about 6 months (around 03/15/2020) for hypertension, GERD, pre-diabetes, cholesterol.  [name], DNP, AGPCNP-BC Adult Gerontology Primary Care Nurse Practitioner [facility] 09/15/2019, 11:08 AM

## 2019-09-17 ENCOUNTER — Ambulatory Visit (INDEPENDENT_AMBULATORY_CARE_PROVIDER_SITE_OTHER): Payer: BLUE CROSS/BLUE SHIELD

## 2019-09-17 ENCOUNTER — Other Ambulatory Visit: Payer: Self-pay

## 2019-09-17 DIAGNOSIS — Z23 Encounter for immunization: Secondary | ICD-10-CM

## 2019-09-18 LAB — LIPID PANEL
Cholesterol: 141 mg/dL (ref <200)
HDL: 31 mg/dL — ABNORMAL LOW (ref >=40)
LDL Cholesterol (Calc): 86 mg/dL (calc)
Non-HDL Cholesterol (Calc): 110 mg/dL (calc) (ref <130)
Total CHOL/HDL Ratio: 4.5 (calc) (ref <5.0)
Triglycerides: 143 mg/dL (ref <150)

## 2019-09-18 LAB — COMPREHENSIVE METABOLIC PANEL
AG Ratio: 1.5 (calc) (ref 1.0–2.5)
ALT: 23 U/L (ref 9–46)
AST: 18 U/L (ref 10–35)
Albumin: 4 g/dL (ref 3.6–5.1)
Alkaline phosphatase (APISO): 81 U/L (ref 35–144)
BUN: 11 mg/dL (ref 7–25)
CO2: 28 mmol/L (ref 20–32)
Calcium: 9.7 mg/dL (ref 8.6–10.3)
Chloride: 101 mmol/L (ref 98–110)
Creat: 0.83 mg/dL (ref 0.70–1.25)
Globulin: 2.6 g/dL (calc) (ref 1.9–3.7)
Glucose, Bld: 114 mg/dL — ABNORMAL HIGH (ref 65–99)
Potassium: 4.5 mmol/L (ref 3.5–5.3)
Sodium: 139 mmol/L (ref 135–146)
Total Bilirubin: 0.5 mg/dL (ref 0.2–1.2)
Total Protein: 6.6 g/dL (ref 6.1–8.1)

## 2019-09-18 LAB — CBC WITH DIFFERENTIAL/PLATELET
Absolute Monocytes: 533 cells/uL (ref 200–950)
Basophils Absolute: 52 cells/uL (ref 0–200)
Basophils Relative: 0.6 %
Eosinophils Absolute: 103 cells/uL (ref 15–500)
Eosinophils Relative: 1.2 %
HCT: 46.1 % (ref 38.5–50.0)
Hemoglobin: 15.3 g/dL (ref 13.2–17.1)
Lymphs Abs: 2657 cells/uL (ref 850–3900)
MCH: 28.7 pg (ref 27.0–33.0)
MCHC: 33.2 g/dL (ref 32.0–36.0)
MCV: 86.3 fL (ref 80.0–100.0)
MPV: 10 fL (ref 7.5–12.5)
Monocytes Relative: 6.2 %
Neutro Abs: 5255 cells/uL (ref 1500–7800)
Neutrophils Relative %: 61.1 %
Platelets: 309 10*3/uL (ref 140–400)
RBC: 5.34 10*6/uL (ref 4.20–5.80)
RDW: 13.6 % (ref 11.0–15.0)
Total Lymphocyte: 30.9 %
WBC: 8.6 10*3/uL (ref 3.8–10.8)

## 2019-09-18 LAB — HEMOGLOBIN A1C
Hgb A1c MFr Bld: 6.1 % of total Hgb — ABNORMAL HIGH (ref <5.7)
Mean Plasma Glucose: 128 (calc)
eAG (mmol/L): 7.1 (calc)

## 2019-09-23 ENCOUNTER — Encounter: Payer: Self-pay | Admitting: Nurse Practitioner

## 2019-12-09 ENCOUNTER — Ambulatory Visit: Payer: BLUE CROSS/BLUE SHIELD | Admitting: Family Medicine

## 2020-01-13 ENCOUNTER — Telehealth: Payer: Self-pay

## 2020-01-13 NOTE — Telephone Encounter (Signed)
Confirmed appointment on 01/17/2020 and screened for covid. klh 

## 2020-01-17 ENCOUNTER — Encounter: Payer: Self-pay | Admitting: Nurse Practitioner

## 2020-01-17 ENCOUNTER — Ambulatory Visit (INDEPENDENT_AMBULATORY_CARE_PROVIDER_SITE_OTHER): Payer: BLUE CROSS/BLUE SHIELD | Admitting: Nurse Practitioner

## 2020-01-17 ENCOUNTER — Other Ambulatory Visit: Payer: Self-pay

## 2020-01-17 VITALS — BP 145/85 | HR 96 | Temp 97.5°F | Resp 16 | Ht 73.0 in | Wt 310.6 lb

## 2020-01-17 DIAGNOSIS — I10 Essential (primary) hypertension: Secondary | ICD-10-CM | POA: Diagnosis not present

## 2020-01-17 DIAGNOSIS — N521 Erectile dysfunction due to diseases classified elsewhere: Secondary | ICD-10-CM

## 2020-01-17 DIAGNOSIS — R7303 Prediabetes: Secondary | ICD-10-CM | POA: Diagnosis not present

## 2020-01-17 DIAGNOSIS — E785 Hyperlipidemia, unspecified: Secondary | ICD-10-CM

## 2020-01-17 MED ORDER — SILDENAFIL CITRATE 100 MG PO TABS: 50.0000 mg | ORAL_TABLET | Freq: Every day | ORAL | 3 refills | Status: DC | PRN

## 2020-01-17 NOTE — Progress Notes (Signed)
[facility] [address] [place], [place] [address]  Internal MEDICINE  Office Visit Note  Patient Name: Shaun Patrick  [id number]  [id number]  Date of Service: 01/21/2020   Complaints/HPI Pt is here for establishment of PCP. Chief Complaint  Patient presents with  . New Patient (Initial Visit)  . Hypertension  . Gastroesophageal Reflux    viagra  . Medication Refill  . Hand Pain    left hand    The patient is here to establish primary care provider. His most recent PCP left the practice and patient would like to start fresh. He has history of hypertension, high cholesterol, and allergies. His blood pressure is generally well controlled. Most recent labs done 09/2019. He did have mildly decreased HDL. Blood sugar was 114 and HgbA1c was 6.1. this will be monitored closely. His last PSA was done 05/12/2018 2.1. he has no concerns or complaints today. He would like to have refill for his sildenafil. He takes this on as needed basis. He states that it is effective when he takes it.    Current Medication: Outpatient Encounter Medications as of 01/17/2020  Medication Sig Note  . amLODipine-benazepril (LOTREL) 10-40 MG capsule Take 1 capsule by mouth daily.   Marland Kitchen aspirin 81 MG chewable tablet CHEW 1 TABLET DAILY   . lansoprazole (PREVACID) 15 MG capsule Take 1 capsule (15 mg total) by mouth daily.   . montelukast (SINGULAIR) 10 MG tablet Take 1 tablet (10 mg total) by mouth daily.   . Multiple Vitamin (MULTI-VITAMINS) TABS Take 1 tablet by mouth 1 day or 1 dose. 05/16/2015: Received from: [facility] Received Sig: Take by mouth.  . rosuvastatin (CRESTOR) 20 MG tablet Take 1 tablet (20 mg total) by mouth daily.   . saw palmetto 160 MG capsule Take 160 mg by mouth daily.   . sildenafil (VIAGRA) 100 MG tablet Take 0.5-1 tablets (50-100 mg total) by mouth daily as needed for erectile dysfunction.   . [DISCONTINUED] sildenafil (VIAGRA) 100 MG tablet Take 0.5-1  tablets (50-100 mg total) by mouth daily as needed for erectile dysfunction.    No facility-administered encounter medications on file as of 01/17/2020.    Surgical History: Past Surgical History:  Procedure Laterality Date  . HERNIA REPAIR     umbilical  . JOINT REPLACEMENT Bilateral    knees  . KNEE SURGERY Bilateral    knee replacements  . NASAL SEPTOPLASTY W/ TURBINOPLASTY Bilateral 10/17/2015   Procedure: NASAL SEPTOPLASTY WITH TURBINATE REDUCTION;  Surgeon: Linus Salmons, MD;  Location: [facility] ORS;  Service: ENT;  Laterality: Bilateral;  . NECK SURGERY  1979   broke neck  . TONSILECTOMY, ADENOIDECTOMY, BILATERAL MYRINGOTOMY AND TUBES    . TONSILLECTOMY      Medical History: Past Medical History:  Diagnosis Date  . GERD (gastroesophageal reflux disease)   . Hypertension     Family History: Family History  Problem Relation Age of Onset  . Stroke Mother   . Heart disease Mother   . Hypertension Mother   . Liver disease Father   . Hypertension Sister     Social History   Socioeconomic History  . Marital status: Married    Spouse name: Not on file  . Number of children: Not on file  . Years of education: Not on file  . Highest education level: Not on file  Occupational History  . Occupation: med Games developer: [employer]  Tobacco Use  .  Smoking status: Former Smoker    Years: 10.00    Types: Cigarettes    Start date: 12/03/1983    Quit date: 12/02/1993    Years since quitting: 26.1  . Smokeless tobacco: Never Used  Substance and Sexual Activity  . Alcohol use: Yes    Alcohol/week: 4.0 standard drinks    Types: 4 Cans of beer per week    Comment: beers on the weekend  . Drug use: No  . Sexual activity: Never  Other Topics Concern  . Not on file  Social History Narrative  . Not on file   Social Determinants of Health   Financial Resource Strain:   . Difficulty of Paying Living Expenses: Not on file  Food Insecurity:    . Worried About Programme researcher, broadcasting/film/video in the Last Year: Not on file  . Ran Out of Food in the Last Year: Not on file  Transportation Needs:   . Lack of Transportation (Medical): Not on file  . Lack of Transportation (Non-Medical): Not on file  Physical Activity:   . Days of Exercise per Week: Not on file  . Minutes of Exercise per Session: Not on file  Stress:   . Feeling of Stress : Not on file  Social Connections:   . Frequency of Communication with Friends and Family: Not on file  . Frequency of Social Gatherings with Friends and Family: Not on file  . Attends Religious Services: Not on file  . Active Member of Clubs or Organizations: Not on file  . Attends Banker Meetings: Not on file  . Marital Status: Not on file  Intimate Partner Violence:   . Fear of Current or Ex-Partner: Not on file  . Emotionally Abused: Not on file  . Physically Abused: Not on file  . Sexually Abused: Not on file     Review of Systems  Constitutional: Negative for activity change, chills, fatigue and unexpected weight change.  HENT: Negative for congestion, rhinorrhea, sneezing and sore throat.   Respiratory: Negative for cough, chest tightness, shortness of breath and wheezing.   Cardiovascular: Negative for chest pain and palpitations.  Gastrointestinal: Negative for abdominal pain, constipation, diarrhea, nausea and vomiting.  Endocrine: Negative for cold intolerance, heat intolerance, polydipsia and polyuria.  Genitourinary:       Intermittent erectile dysfunction.   Musculoskeletal: Positive for arthralgias and myalgias. Negative for back pain, joint swelling and neck pain.       Has pain in his left hand. Has seen orthopedics and has had procedure done on it. States that pain continues. He is right-handed, so this does not interfere with ability to participate in work and normal activities.   Skin: Negative for rash.  Allergic/Immunologic: Negative for environmental allergies.   Neurological: Negative for dizziness, tremors, numbness and headaches.  Hematological: Negative for adenopathy. Does not bruise/bleed easily.  Psychiatric/Behavioral: Negative for behavioral problems (Depression), sleep disturbance and suicidal ideas. The patient is not nervous/anxious.     Today's Vitals   01/17/20 1115  BP: (!) 145/85  Pulse: 96  Resp: 16  Temp: (!) 97.5 F (36.4 C)  SpO2: 98%  Weight: (!) 310 lb 9.6 oz (140.9 kg)  Height: 6\' 1"  (1.854 m)   Body mass index is 40.98 kg/m.  Physical Exam Vitals and nursing note reviewed.  Constitutional:      General: He is not in acute distress.    Appearance: Normal appearance. He is well-developed. He is not diaphoretic.  HENT:  Head: Normocephalic and atraumatic.     Nose: Nose normal.     Mouth/Throat:     Pharynx: No oropharyngeal exudate.  Eyes:     Pupils: Pupils are equal, round, and reactive to light.  Neck:     Thyroid: No thyromegaly.     Vascular: No carotid bruit or JVD.     Trachea: No tracheal deviation.  Cardiovascular:     Rate and Rhythm: Normal rate and regular rhythm.     Heart sounds: Normal heart sounds. No murmur. No friction rub. No gallop.   Pulmonary:     Effort: Pulmonary effort is normal. No respiratory distress.     Breath sounds: Normal breath sounds. No wheezing or rales.  Chest:     Chest wall: No tenderness.  Abdominal:     Palpations: Abdomen is soft.  Musculoskeletal:        General: Normal range of motion.     Cervical back: Normal range of motion and neck supple.     Comments: Some swelling in the joints of the left hand. He does have full ROM and grip is strong.   Lymphadenopathy:     Cervical: No cervical adenopathy.  Skin:    General: Skin is warm and dry.  Neurological:     Mental Status: He is alert and oriented to person, place, and time.     Cranial Nerves: No cranial nerve deficit.  Psychiatric:        Behavior: Behavior normal.        Thought Content:  Thought content normal.        Judgment: Judgment normal.   Assessment/Plan: 1. Benign hypertension Blood pressure stable. Continue bp medication as prescribed   2. Prediabetes Most recent check HgbA1c 6.1. continue to manage through diet and exercise. Check HgbA1c at next visit.   3. Dyslipidemia Continue crestor as prescribed   4. Erectile dysfunction due to diseases classified elsewhere May take sildenafil as needed and as prescribed.  - sildenafil (VIAGRA) 100 MG tablet; Take 0.5-1 tablets (50-100 mg total) by mouth daily as needed for erectile dysfunction.  Dispense: 10 tablet; Refill: 3  General Counseling: Shaun Patrick verbalizes understanding of the findings of todays visit and agrees with plan of treatment. I have discussed any further diagnostic evaluation that may be needed or ordered today. We also reviewed his medications today. he has been encouraged to call the office with any questions or concerns that should arise related to todays visit.    Counseling:  Hypertension Counseling:   The following hypertensive lifestyle modification were recommended and discussed:  1. Limiting alcohol intake to less than 1 oz/day of ethanol:(24 oz of beer or 8 oz of wine or 2 oz of 100-proof whiskey). 2. Take baby ASA 81 mg daily. 3. Importance of regular aerobic exercise and losing weight. 4. Reduce dietary saturated fat and cholesterol intake for overall cardiovascular health. 5. Maintaining adequate dietary potassium, calcium, and magnesium intake. 6. Regular monitoring of the blood pressure. 7. Reduce sodium intake to less than 100 mmol/day (less than 2.3 gm of sodium or less than 6 gm of sodium choride)   This patient was seen by Vincent Gros FNP Collaboration with Dr Lyndon Code as a part of collaborative care agreement  Meds ordered this encounter  Medications  . sildenafil (VIAGRA) 100 MG tablet    Sig: Take 0.5-1 tablets (50-100 mg total) by mouth daily as needed for erectile  dysfunction.    Dispense:  10 tablet  Refill:  3    Order Specific Question:   Supervising Provider    Answer:   Lavera Guise [[id number]]    Time spent: 30 Minutes   Time spent with patient included reviewing progress notes, labs, imaging studies, and discussing plan for follow up.

## 2020-04-06 ENCOUNTER — Other Ambulatory Visit: Payer: Self-pay

## 2020-04-06 ENCOUNTER — Other Ambulatory Visit: Payer: Self-pay | Admitting: Nurse Practitioner

## 2020-04-06 DIAGNOSIS — E785 Hyperlipidemia, unspecified: Secondary | ICD-10-CM

## 2020-04-06 MED ORDER — ROSUVASTATIN CALCIUM 20 MG PO TABS: 20.0000 mg | ORAL_TABLET | Freq: Every day | ORAL | 0 refills | Status: DC

## 2020-04-14 ENCOUNTER — Other Ambulatory Visit: Payer: Self-pay | Admitting: Nurse Practitioner

## 2020-04-14 ENCOUNTER — Other Ambulatory Visit: Payer: Self-pay

## 2020-04-14 DIAGNOSIS — I10 Essential (primary) hypertension: Secondary | ICD-10-CM

## 2020-04-14 MED ORDER — AMLODIPINE BESY-BENAZEPRIL HCL 10-40 MG PO CAPS: 1.0000 | ORAL_CAPSULE | Freq: Every day | ORAL | 1 refills | Status: DC

## 2020-04-24 ENCOUNTER — Telehealth: Payer: Self-pay

## 2020-04-24 NOTE — Telephone Encounter (Signed)
Confirmed virtual visit on 04/25/2020. klh

## 2020-04-25 ENCOUNTER — Encounter: Payer: Self-pay | Admitting: Adult Health

## 2020-04-25 ENCOUNTER — Ambulatory Visit: Payer: BLUE CROSS/BLUE SHIELD | Admitting: Adult Health

## 2020-04-25 ENCOUNTER — Other Ambulatory Visit: Payer: Self-pay

## 2020-04-25 VITALS — Resp 16 | Ht 73.0 in | Wt 310.0 lb

## 2020-04-25 DIAGNOSIS — J45909 Unspecified asthma, uncomplicated: Secondary | ICD-10-CM | POA: Diagnosis not present

## 2020-04-25 DIAGNOSIS — J0100 Acute maxillary sinusitis, unspecified: Secondary | ICD-10-CM | POA: Diagnosis not present

## 2020-04-25 MED ORDER — FLUTICASONE PROPIONATE 50 MCG/ACT NA SUSP: 2.0000 | Freq: Every day | NASAL | 6 refills | Status: DC

## 2020-04-25 MED ORDER — AZITHROMYCIN 250 MG PO TABS: ORAL_TABLET | ORAL | 0 refills | Status: DC

## 2020-04-25 NOTE — Progress Notes (Signed)
[facility] [place], [place] [address]  Internal MEDICINE  Telephone Visit  Patient Name: Shaun Patrick  [id number]  [id number]  Date of Service: 04/25/2020  I connected with the patient at 1152 by telephone and verified the patients identity using two identifiers.   I discussed the limitations, risks, security and privacy concerns of performing an evaluation and management service by telephone and the availability of in person appointments. I also discussed with the patient that there may be a patient responsible charge related to the service.  The patient expressed understanding and agrees to proceed.    Chief Complaint  Patient presents with  . Telephone Screen    having sinus issues, when laying down he coughs up phlem , drainage   . Telephone Assessment  . Hypertension    HPI  Pt seen via telephone.  Pt reports he was mowing the yard with no mask on about a week ago.  He reports after that he has been having sinus pressure, and PND. He does report some sore throat from the drainage.    Current Medication: Outpatient Encounter Medications as of 04/25/2020  Medication Sig Note  . amLODipine-benazepril (LOTREL) 10-40 MG capsule Take 1 capsule by mouth daily.   Marland Kitchen aspirin 81 MG chewable tablet CHEW 1 TABLET DAILY   . lansoprazole (PREVACID) 15 MG capsule Take 1 capsule (15 mg total) by mouth daily.   . montelukast (SINGULAIR) 10 MG tablet Take 1 tablet (10 mg total) by mouth daily.   . Multiple Vitamin (MULTI-VITAMINS) TABS Take 1 tablet by mouth 1 day or 1 dose. 05/16/2015: Received from: [place]: Take by mouth.  . rosuvastatin (CRESTOR) 20 MG tablet Take 1 tablet (20 mg total) by mouth daily.   . saw palmetto 160 MG capsule Take 160 mg by mouth daily.   . sildenafil (VIAGRA) 100 MG tablet Take 0.5-1 tablets (50-100 mg total) by mouth daily as needed for erectile dysfunction.    No facility-administered encounter  medications on file as of 04/25/2020.    Surgical History: Past Surgical History:  Procedure Laterality Date  . HERNIA REPAIR     umbilical  . JOINT REPLACEMENT Bilateral    knees  . KNEE SURGERY Bilateral    knee replacements  . NASAL SEPTOPLASTY W/ TURBINOPLASTY Bilateral 10/17/2015   Procedure: NASAL SEPTOPLASTY WITH TURBINATE REDUCTION;  Surgeon: Beverly Gust, MD;  Location: [facility] ORS;  Service: ENT;  Laterality: Bilateral;  . Liborio Negron Torres   broke neck  . TONSILECTOMY, ADENOIDECTOMY, BILATERAL MYRINGOTOMY AND TUBES    . TONSILLECTOMY      Medical History: Past Medical History:  Diagnosis Date  . GERD (gastroesophageal reflux disease)   . Hypertension     Family History: Family History  Problem Relation Age of Onset  . Stroke Mother   . Heart disease Mother   . Hypertension Mother   . Liver disease Father   . Hypertension Sister     Social History   Socioeconomic History  . Marital status: Married    Spouse name: Not on file  . Number of children: Not on file  . Years of education: Not on file  . Highest education level: Not on file  Occupational History  . Occupation: med Animator: [employer]  Tobacco Use  . Smoking status: Former Smoker    Years: 10.00    Types: Cigarettes    Start date: 12/03/1983  Quit date: 12/02/1993    Years since quitting: 26.4  . Smokeless tobacco: Never Used  Substance and Sexual Activity  . Alcohol use: Yes    Alcohol/week: 4.0 standard drinks    Types: 4 Cans of beer per week  . Drug use: No  . Sexual activity: Never  Other Topics Concern  . Not on file  Social History Narrative  . Not on file   Social Determinants of Health   Financial Resource Strain:   . Difficulty of Paying Living Expenses:   Food Insecurity:   . Worried About Programme researcher, broadcasting/film/video in the Last Year:   . Barista in the Last Year:   Transportation Needs:   . Freight forwarder (Medical):    Marland Kitchen Lack of Transportation (Non-Medical):   Physical Activity:   . Days of Exercise per Week:   . Minutes of Exercise per Session:   Stress:   . Feeling of Stress :   Social Connections:   . Frequency of Communication with Friends and Family:   . Frequency of Social Gatherings with Friends and Family:   . Attends Religious Services:   . Active Member of Clubs or Organizations:   . Attends Banker Meetings:   Marland Kitchen Marital Status:   Intimate Partner Violence:   . Fear of Current or Ex-Partner:   . Emotionally Abused:   Marland Kitchen Physically Abused:   . Sexually Abused:       Review of Systems  Constitutional: Negative.  Negative for chills, fatigue and unexpected weight change.  HENT: Negative.  Negative for congestion, rhinorrhea, sneezing and sore throat.   Eyes: Negative for redness.  Respiratory: Negative.  Negative for cough, chest tightness and shortness of breath.   Cardiovascular: Negative.  Negative for chest pain and palpitations.  Gastrointestinal: Negative.  Negative for abdominal pain, constipation, diarrhea, nausea and vomiting.  Endocrine: Negative.   Genitourinary: Negative.  Negative for dysuria and frequency.  Musculoskeletal: Negative.  Negative for arthralgias, back pain, joint swelling and neck pain.  Skin: Negative.  Negative for rash.  Allergic/Immunologic: Negative.   Neurological: Negative.  Negative for tremors and numbness.  Hematological: Negative for adenopathy. Does not bruise/bleed easily.  Psychiatric/Behavioral: Negative.  Negative for behavioral problems, sleep disturbance and suicidal ideas. The patient is not nervous/anxious.     Vital Signs: Resp 16   Ht 6\' 1"  (1.854 m)   Wt (!) 310 lb (140.6 kg)   BMI 40.90 kg/m    Observation/Objective:  Well sounding, NAD noted.    Assessment/Plan: 1. Asthma due to environmental allergies Start flonase as prescribed.  - fluticasone (FLONASE) 50 MCG/ACT nasal spray; Place 2 sprays into both  nostrils daily.  Dispense: 16 g; Refill: 6  2. Subacute maxillary sinusitis Advised patient to take entire course of antibiotics as prescribed with food. Pt should return to clinic in 7-10 days if symptoms fail to improve or new symptoms develop.  - azithromycin (ZITHROMAX) 250 MG tablet; Take as directe  Dispense: 6 tablet; Refill: 0  General Counseling: Atom verbalizes understanding of the findings of today's phone visit and agrees with plan of treatment. I have discussed any further diagnostic evaluation that may be needed or ordered today. We also reviewed his medications today. he has been encouraged to call the office with any questions or concerns that should arise related to todays visit.    No orders of the defined types were placed in this encounter.   No orders of the  defined types were placed in this encounter.   Time spent: 25 Minutes    Blima Ledger [facility] Internal medicine

## 2020-04-27 ENCOUNTER — Other Ambulatory Visit: Payer: Self-pay | Admitting: Nurse Practitioner

## 2020-04-27 DIAGNOSIS — K219 Gastro-esophageal reflux disease without esophagitis: Secondary | ICD-10-CM

## 2020-05-02 ENCOUNTER — Telehealth: Payer: Self-pay

## 2020-05-02 NOTE — Telephone Encounter (Signed)
Pt called that still have little drainage no other symptoms advised used nasal sprays and take singulair and if not feeling better need to been seen

## 2020-05-29 ENCOUNTER — Ambulatory Visit: Payer: BLUE CROSS/BLUE SHIELD | Admitting: Adult Health

## 2020-07-10 ENCOUNTER — Other Ambulatory Visit: Payer: Self-pay

## 2020-07-10 DIAGNOSIS — E785 Hyperlipidemia, unspecified: Secondary | ICD-10-CM

## 2020-07-10 MED ORDER — ROSUVASTATIN CALCIUM 20 MG PO TABS: 20.0000 mg | ORAL_TABLET | Freq: Every day | ORAL | 0 refills | Status: DC

## 2020-07-13 ENCOUNTER — Telehealth: Payer: Self-pay

## 2020-07-13 NOTE — Telephone Encounter (Signed)
Lmom to confirm and screen for 07-17-20 ov. 

## 2020-07-17 ENCOUNTER — Encounter: Payer: Self-pay | Admitting: Nurse Practitioner

## 2020-07-17 ENCOUNTER — Other Ambulatory Visit: Payer: Self-pay

## 2020-07-17 ENCOUNTER — Ambulatory Visit (INDEPENDENT_AMBULATORY_CARE_PROVIDER_SITE_OTHER): Payer: BLUE CROSS/BLUE SHIELD | Admitting: Nurse Practitioner

## 2020-07-17 VITALS — BP 140/80 | HR 100 | Temp 97.5°F | Resp 16 | Ht 73.0 in | Wt 313.0 lb

## 2020-07-17 DIAGNOSIS — E785 Hyperlipidemia, unspecified: Secondary | ICD-10-CM | POA: Diagnosis not present

## 2020-07-17 DIAGNOSIS — K219 Gastro-esophageal reflux disease without esophagitis: Secondary | ICD-10-CM

## 2020-07-17 DIAGNOSIS — N521 Erectile dysfunction due to diseases classified elsewhere: Secondary | ICD-10-CM

## 2020-07-17 DIAGNOSIS — Z0001 Encounter for general adult medical examination with abnormal findings: Secondary | ICD-10-CM

## 2020-07-17 DIAGNOSIS — R300 Dysuria: Secondary | ICD-10-CM

## 2020-07-17 DIAGNOSIS — I10 Essential (primary) hypertension: Secondary | ICD-10-CM | POA: Diagnosis not present

## 2020-07-17 MED ORDER — LANSOPRAZOLE 15 MG PO CPDR: 15.0000 mg | DELAYED_RELEASE_CAPSULE | Freq: Every day | ORAL | 1 refills | Status: DC

## 2020-07-17 NOTE — Progress Notes (Signed)
[facility] [address] [place], [place] [address]  Internal MEDICINE  Office Visit Note  Patient Name: Shaun Patrick  [id number]  [id number]  Date of Service: 07/30/2020  Chief Complaint  Patient presents with  . Annual Exam  . Gastroesophageal Reflux  . Hypertension  . Medication Refill    viagra     The patient is here for health maintenance exam. The patient states that he is doing well. His blood pressure is well managed. He will be due to have routine, fasting labs prior to his next visit. A lab order sheet will be provided today. He states that he has no concerns or complaints today.   Pt is here for routine health maintenance examination  Current Medication: Outpatient Encounter Medications as of 07/17/2020  Medication Sig Note  . amLODipine-benazepril (LOTREL) 10-40 MG capsule Take 1 capsule by mouth daily.   Marland Kitchen aspirin 81 MG chewable tablet CHEW 1 TABLET DAILY   . lansoprazole (PREVACID) 15 MG capsule Take 1 capsule (15 mg total) by mouth daily.   . Multiple Vitamin (MULTI-VITAMINS) TABS Take 1 tablet by mouth 1 day or 1 dose. 05/16/2015: Received from: [facility] Received Sig: Take by mouth.  . rosuvastatin (CRESTOR) 20 MG tablet Take 1 tablet (20 mg total) by mouth daily.   . saw palmetto 160 MG capsule Take 160 mg by mouth daily.   . sildenafil (VIAGRA) 100 MG tablet Take 0.5-1 tablets (50-100 mg total) by mouth daily as needed for erectile dysfunction.   . [DISCONTINUED] azithromycin (ZITHROMAX) 250 MG tablet Take as directed for 5 days   . [DISCONTINUED] fluticasone (FLONASE) 50 MCG/ACT nasal spray Place 2 sprays into both nostrils daily.   . [DISCONTINUED] lansoprazole (PREVACID) 15 MG capsule TAKE ONE CAPSULE BY MOUTH DAILY   . [DISCONTINUED] montelukast (SINGULAIR) 10 MG tablet Take 1 tablet (10 mg total) by mouth daily.    No facility-administered encounter medications on file as of 07/17/2020.    Surgical History: Past  Surgical History:  Procedure Laterality Date  . HERNIA REPAIR     umbilical  . JOINT REPLACEMENT Bilateral    knees  . KNEE SURGERY Bilateral    knee replacements  . NASAL SEPTOPLASTY W/ TURBINOPLASTY Bilateral 10/17/2015   Procedure: NASAL SEPTOPLASTY WITH TURBINATE REDUCTION;  Surgeon: Linus Salmons, MD;  Location: [facility] ORS;  Service: ENT;  Laterality: Bilateral;  . NECK SURGERY  1979   broke neck  . TONSILECTOMY, ADENOIDECTOMY, BILATERAL MYRINGOTOMY AND TUBES    . TONSILLECTOMY      Medical History: Past Medical History:  Diagnosis Date  . GERD (gastroesophageal reflux disease)   . Hypertension     Family History: Family History  Problem Relation Age of Onset  . Stroke Mother   . Heart disease Mother   . Hypertension Mother   . Liver disease Father   . Hypertension Sister       Review of Systems  Constitutional: Negative for activity change, chills, fatigue and unexpected weight change.  HENT: Negative for congestion, rhinorrhea, sneezing and sore throat.   Respiratory: Negative for cough, chest tightness, shortness of breath and wheezing.   Cardiovascular: Negative for chest pain and palpitations.       Blood pressure is well managed on current medication.   Gastrointestinal: Negative for abdominal pain, constipation, diarrhea, nausea and vomiting.  Endocrine: Negative for cold intolerance, heat intolerance, polydipsia and polyuria.  Genitourinary:       Intermittent erectile dysfunction.  Musculoskeletal: Negative for arthralgias, back pain, joint swelling, myalgias and neck pain.  Skin: Negative for rash.  Allergic/Immunologic: Negative for environmental allergies.  Neurological: Negative for dizziness, tremors, numbness and headaches.  Hematological: Negative for adenopathy. Does not bruise/bleed easily.  Psychiatric/Behavioral: Negative for behavioral problems (Depression), sleep disturbance and suicidal ideas. The patient is not nervous/anxious.       Today's Vitals   07/17/20 1134  BP: 140/80  Pulse: 100  Resp: 16  Temp: (!) 97.5 F (36.4 C)  SpO2: 98%  Weight: (!) 313 lb (142 kg)  Height: 6\' 1"  (1.854 m)   Body mass index is 41.3 kg/m.  Physical Exam Vitals and nursing note reviewed.  Constitutional:      General: He is not in acute distress.    Appearance: Normal appearance. He is well-developed. He is not diaphoretic.  HENT:     Head: Normocephalic and atraumatic.     Nose: Nose normal.     Mouth/Throat:     Pharynx: No oropharyngeal exudate.  Eyes:     Pupils: Pupils are equal, round, and reactive to light.  Neck:     Thyroid: No thyromegaly.     Vascular: No carotid bruit or JVD.     Trachea: No tracheal deviation.  Cardiovascular:     Rate and Rhythm: Normal rate and regular rhythm.     Pulses: Normal pulses.     Heart sounds: Normal heart sounds. No murmur heard.  No friction rub. No gallop.   Pulmonary:     Effort: Pulmonary effort is normal. No respiratory distress.     Breath sounds: Normal breath sounds. No wheezing or rales.  Chest:     Chest wall: No tenderness.  Abdominal:     General: Bowel sounds are normal.     Palpations: Abdomen is soft.     Tenderness: There is no abdominal tenderness.  Musculoskeletal:        General: Normal range of motion.     Cervical back: Normal range of motion and neck supple.  Lymphadenopathy:     Cervical: No cervical adenopathy.  Skin:    General: Skin is warm and dry.  Neurological:     General: No focal deficit present.     Mental Status: He is alert and oriented to person, place, and time.     Cranial Nerves: No cranial nerve deficit.  Psychiatric:        Mood and Affect: Mood normal.        Behavior: Behavior normal.        Thought Content: Thought content normal.        Judgment: Judgment normal.     LABS: Recent Results (from the past 2160 hour(s))  UA/M w/rflx Culture, Routine     Status: None   Collection Time: 07/17/20  4:51 PM    Specimen: Urine   Urine  Result Value Ref Range   Specific Gravity, UA 1.017 1.005 - 1.030   pH, UA 6.0 5.0 - 7.5   Color, UA Yellow Yellow   Appearance Ur Clear Clear   Leukocytes,UA Negative Negative   Protein,UA Negative Negative/Trace   Glucose, UA Negative Negative   Ketones, UA Negative Negative   RBC, UA Negative Negative   Bilirubin, UA Negative Negative   Urobilinogen, Ur 0.2 0.2 - 1.0 mg/dL   Nitrite, UA Negative Negative   Microscopic Examination Comment     Comment: Microscopic follows if indicated.   Microscopic Examination See below:  Comment: Microscopic was indicated and was performed.   Urinalysis Reflex Comment     Comment: This specimen will not reflex to a Urine Culture.  Microscopic Examination     Status: None   Collection Time: 07/17/20  4:51 PM   Urine  Result Value Ref Range   WBC, UA None seen 0 - 5 /hpf   RBC 0-2 0 - 2 /hpf   Epithelial Cells (non renal) None seen 0 - 10 /hpf   Casts None seen None seen /lpf   Bacteria, UA None seen None seen/Few    Assessment/Plan: 1. Encounter for general adult medical examination with abnormal findings Annual health maintenance exam today. Order slip given to have routie, fasting labs done prior to his next visit .  2. Benign hypertension Stable. Continue bp medication as prescribed   3. Gastroesophageal reflux disease without esophagitis Continue prevacid as prescribed  - lansoprazole (PREVACID) 15 MG capsule; Take 1 capsule (15 mg total) by mouth daily.  Dispense: 90 capsule; Refill: 1  4. Dyslipidemia Check fasting lipid panel prior to next visit and adjust statin dosing as indicated.   5. Erectile dysfunction due to diseases classified elsewhere Use viagra as needed and as prescribed   6. Dysuria - UA/M w/rflx Culture, Routine  General Counseling: Bradley verbalizes understanding of the findings of todays visit and agrees with plan of treatment. I have discussed any further diagnostic evaluation  that may be needed or ordered today. We also reviewed his medications today. he has been encouraged to call the office with any questions or concerns that should arise related to todays visit.    Counseling:  Hypertension Counseling:   The following hypertensive lifestyle modification were recommended and discussed:  1. Limiting alcohol intake to less than 1 oz/day of ethanol:(24 oz of beer or 8 oz of wine or 2 oz of 100-proof whiskey). 2. Take baby ASA 81 mg daily. 3. Importance of regular aerobic exercise and losing weight. 4. Reduce dietary saturated fat and cholesterol intake for overall cardiovascular health. 5. Maintaining adequate dietary potassium, calcium, and magnesium intake. 6. Regular monitoring of the blood pressure. 7. Reduce sodium intake to less than 100 mmol/day (less than 2.3 gm of sodium or less than 6 gm of sodium choride)   This patient was seen by Vincent Gros FNP Collaboration with Dr Lyndon Code as a part of collaborative care agreement  Orders Placed This Encounter  Procedures  . Microscopic Examination  . UA/M w/rflx Culture, Routine    Meds ordered this encounter  Medications  . lansoprazole (PREVACID) 15 MG capsule    Sig: Take 1 capsule (15 mg total) by mouth daily.    Dispense:  90 capsule    Refill:  1    Order Specific Question:   Supervising Provider    Answer:   Lyndon Code [[id number]]    Total time spent:45 Minutes  Time spent includes review of chart, medications, test results, and follow up plan with the patient.     Lyndon Code, MD  Internal Medicine

## 2020-07-18 ENCOUNTER — Other Ambulatory Visit: Payer: Self-pay | Admitting: Family Medicine

## 2020-07-18 ENCOUNTER — Other Ambulatory Visit: Payer: Self-pay

## 2020-07-18 DIAGNOSIS — J3089 Other allergic rhinitis: Secondary | ICD-10-CM

## 2020-07-18 DIAGNOSIS — J302 Other seasonal allergic rhinitis: Secondary | ICD-10-CM

## 2020-07-18 LAB — UA/M W/RFLX CULTURE, ROUTINE
Bilirubin, UA: NEGATIVE
Glucose, UA: NEGATIVE
Ketones, UA: NEGATIVE
Leukocytes,UA: NEGATIVE
Nitrite, UA: NEGATIVE
Protein,UA: NEGATIVE
RBC, UA: NEGATIVE
Specific Gravity, UA: 1.017 (ref 1.005–1.030)
Urobilinogen, Ur: 0.2 mg/dL (ref 0.2–1.0)
pH, UA: 6 (ref 5.0–7.5)

## 2020-07-18 LAB — MICROSCOPIC EXAMINATION
Bacteria, UA: NONE SEEN
Casts: NONE SEEN /lpf
Epithelial Cells (non renal): NONE SEEN /hpf (ref 0–10)
WBC, UA: NONE SEEN /hpf (ref 0–5)

## 2020-07-18 MED ORDER — MONTELUKAST SODIUM 10 MG PO TABS: 10.0000 mg | ORAL_TABLET | Freq: Every day | ORAL | 1 refills | Status: DC

## 2020-07-24 ENCOUNTER — Telehealth: Payer: Self-pay

## 2020-07-30 DIAGNOSIS — Z0001 Encounter for general adult medical examination with abnormal findings: Secondary | ICD-10-CM | POA: Insufficient documentation

## 2020-07-30 DIAGNOSIS — R300 Dysuria: Secondary | ICD-10-CM | POA: Insufficient documentation

## 2020-08-02 ENCOUNTER — Other Ambulatory Visit: Payer: Self-pay | Admitting: Nurse Practitioner

## 2020-08-02 DIAGNOSIS — N521 Erectile dysfunction due to diseases classified elsewhere: Secondary | ICD-10-CM

## 2020-08-02 MED ORDER — TADALAFIL 20 MG PO TABS: 20.0000 mg | ORAL_TABLET | Freq: Every day | ORAL | 2 refills | Status: DC | PRN

## 2020-08-02 NOTE — Telephone Encounter (Signed)
Please let him know I changed prescription to cialis 20mg . He should try 1/2 to 1 tablet as needed. I sent this to . This may not be covered per is insurance.

## 2020-08-02 NOTE — Telephone Encounter (Signed)
Pt.notified

## 2020-08-10 ENCOUNTER — Encounter: Payer: Self-pay | Admitting: Hospice and Palliative Medicine

## 2020-08-10 ENCOUNTER — Ambulatory Visit: Payer: BLUE CROSS/BLUE SHIELD | Admitting: Hospice and Palliative Medicine

## 2020-08-10 ENCOUNTER — Other Ambulatory Visit: Payer: Self-pay

## 2020-08-10 VITALS — BP 110/70 | HR 95 | Temp 98.7°F | Resp 16 | Ht 73.0 in | Wt 312.6 lb

## 2020-08-10 DIAGNOSIS — I10 Essential (primary) hypertension: Secondary | ICD-10-CM | POA: Diagnosis not present

## 2020-08-10 DIAGNOSIS — L600 Ingrowing nail: Secondary | ICD-10-CM

## 2020-08-10 DIAGNOSIS — Z23 Encounter for immunization: Secondary | ICD-10-CM

## 2020-08-10 DIAGNOSIS — Z125 Encounter for screening for malignant neoplasm of prostate: Secondary | ICD-10-CM | POA: Diagnosis not present

## 2020-08-10 DIAGNOSIS — Z0001 Encounter for general adult medical examination with abnormal findings: Secondary | ICD-10-CM

## 2020-08-10 NOTE — Progress Notes (Addendum)
[facility] [address] [place], [place] [address]  Internal MEDICINE  Office Visit Note  Patient Name: Shaun Patrick  [id number]  [id number]  Date of Service: 08/14/2020  Chief Complaint  Patient presents with  . Acute Visit    right ingrown toe nail, big toe  . Hypertension     HPI Pt is here for a sick visit. Complaints today of pain in his right great toe around the toenail. He thinks it may be an ingrown toe. Noticed the pain about 1 week ago and since then the pain has gotten worse. The pain is intensified when he touches his toenail. Pain is also worse when he is wearing tight constrictive shoes. Denies any recent injury.  Current Medication:  Outpatient Encounter Medications as of 08/10/2020  Medication Sig Note  . amLODipine-benazepril (LOTREL) 10-40 MG capsule Take 1 capsule by mouth daily.   Marland Kitchen aspirin 81 MG chewable tablet CHEW 1 TABLET DAILY   . lansoprazole (PREVACID) 15 MG capsule Take 1 capsule (15 mg total) by mouth daily.   . montelukast (SINGULAIR) 10 MG tablet Take 1 tablet (10 mg total) by mouth daily.   . Multiple Vitamin (MULTI-VITAMINS) TABS Take 1 tablet by mouth 1 day or 1 dose. 05/16/2015: Received from: [facility] Received Sig: Take by mouth.  . rosuvastatin (CRESTOR) 20 MG tablet Take 1 tablet (20 mg total) by mouth daily.   . saw palmetto 160 MG capsule Take 160 mg by mouth daily.   . tadalafil (CIALIS) 20 MG tablet Take 1 tablet (20 mg total) by mouth daily as needed for erectile dysfunction.    No facility-administered encounter medications on file as of 08/10/2020.      Medical History: Past Medical History:  Diagnosis Date  . GERD (gastroesophageal reflux disease)   . Hypertension      Vital Signs: BP 110/70   Pulse 95   Temp 98.7 F (37.1 C)   Resp 16   Ht 6\' 1"  (1.854 m)   Wt (!) 312 lb 9.6 oz (141.8 kg)   SpO2 100%   BMI 41.24 kg/m    Review of Systems  Constitutional: Negative for  chills, fatigue and unexpected weight change.  HENT: Negative for congestion, postnasal drip, rhinorrhea, sneezing and sore throat.   Eyes: Negative for redness.  Respiratory: Negative for cough, chest tightness and shortness of breath.   Cardiovascular: Negative for chest pain, palpitations and leg swelling.  Gastrointestinal: Negative for abdominal pain, constipation, diarrhea, nausea and vomiting.  Genitourinary: Negative for dysuria and frequency.  Musculoskeletal: Negative for arthralgias, back pain, joint swelling and neck pain.  Skin: Negative for rash.       Right great toe ingrown toenail, pain  Neurological: Negative.  Negative for tremors and numbness.  Hematological: Negative for adenopathy. Does not bruise/bleed easily.  Psychiatric/Behavioral: Negative for behavioral problems (Depression), sleep disturbance and suicidal ideas. The patient is not nervous/anxious.     Physical Exam Constitutional:      Appearance: Normal appearance.  HENT:     Mouth/Throat:     Mouth: Mucous membranes are moist.     Pharynx: Oropharynx is clear.  Cardiovascular:     Rate and Rhythm: Normal rate and regular rhythm.     Pulses: Normal pulses.     Heart sounds: Normal heart sounds.  Pulmonary:     Effort: Pulmonary effort is normal.     Breath sounds: Normal breath sounds.  Abdominal:     General:  Abdomen is flat.     Palpations: Abdomen is soft.  Musculoskeletal:        General: Normal range of motion.     Cervical back: Normal range of motion.  Skin:    General: Skin is warm.     Comments: Right great toe-mild erythema, painful to touch, no obvious signs of infection   Neurological:     General: No focal deficit present.     Mental Status: He is alert and oriented to person, place, and time. Mental status is at baseline.  Psychiatric:        Mood and Affect: Mood normal.        Behavior: Behavior normal.        Thought Content: Thought content normal.    Assessment/Plan: 1.  Ingrown toenail of right foot Will refer out to podiatry and follow along with treatment and management plans. - Ambulatory referral to Podiatry  2. Benign hypertension BP and HR well controlled today, continue with current treatment and routine monitoring.  3. Flu vaccine need - Flu Vaccine MDCK QUAD PF  Labs ordered for upcoming CPE - CBC w/Diff/Platelet - Comprehensive Metabolic Panel (CMET) - Lipid Panel With LDL/HDL Ratio - TSH + free T4 - WILL NEED COLONOSCOPY IF NOT DONE BEFORE  General Counseling: Burlin verbalizes understanding of the findings of todays visit and agrees with plan of treatment. I have discussed any further diagnostic evaluation that may be needed or ordered today. We also reviewed his medications today. he has been encouraged to call the office with any questions or concerns that should arise related to todays visit.   Orders Placed This Encounter  Procedures  . Flu Vaccine MDCK QUAD PF  . CBC w/Diff/Platelet  . Comprehensive Metabolic Panel (CMET)  . Lipid Panel With LDL/HDL Ratio  . TSH + free T4  . PSA  . Ambulatory referral to Podiatry    Time spent: 25 Minutes  This patient was seen by Leeanne Deed AGNP-C in Collaboration with Dr Lyndon Code as a part of collaborative care agreement.  Lubertha Basque [facility] Internal Medicine

## 2020-08-14 ENCOUNTER — Encounter: Payer: Self-pay | Admitting: Hospice and Palliative Medicine

## 2020-10-16 ENCOUNTER — Other Ambulatory Visit: Payer: Self-pay | Admitting: Family Medicine

## 2020-10-16 DIAGNOSIS — E785 Hyperlipidemia, unspecified: Secondary | ICD-10-CM

## 2020-10-19 ENCOUNTER — Other Ambulatory Visit: Payer: Self-pay

## 2020-10-19 ENCOUNTER — Emergency Department: Payer: BLUE CROSS/BLUE SHIELD

## 2020-10-19 ENCOUNTER — Emergency Department
Admission: EM | Admit: 2020-10-19 | Discharge: 2020-10-19 | Disposition: A | Discharge status: Home / Self Care | Payer: BLUE CROSS/BLUE SHIELD | Attending: Emergency Medicine | Admitting: Emergency Medicine

## 2020-10-19 DIAGNOSIS — Z79899 Other long term (current) drug therapy: Secondary | ICD-10-CM | POA: Diagnosis not present

## 2020-10-19 DIAGNOSIS — I10 Essential (primary) hypertension: Secondary | ICD-10-CM | POA: Insufficient documentation

## 2020-10-19 DIAGNOSIS — Z96653 Presence of artificial knee joint, bilateral: Secondary | ICD-10-CM | POA: Diagnosis not present

## 2020-10-19 DIAGNOSIS — Z7982 Long term (current) use of aspirin: Secondary | ICD-10-CM | POA: Diagnosis not present

## 2020-10-19 DIAGNOSIS — Z87891 Personal history of nicotine dependence: Secondary | ICD-10-CM | POA: Diagnosis not present

## 2020-10-19 DIAGNOSIS — S20219A Contusion of unspecified front wall of thorax, initial encounter: Secondary | ICD-10-CM

## 2020-10-19 DIAGNOSIS — S20212A Contusion of left front wall of thorax, initial encounter: Secondary | ICD-10-CM | POA: Insufficient documentation

## 2020-10-19 DIAGNOSIS — Y92410 Unspecified street and highway as the place of occurrence of the external cause: Secondary | ICD-10-CM | POA: Diagnosis not present

## 2020-10-19 DIAGNOSIS — R079 Chest pain, unspecified: Secondary | ICD-10-CM | POA: Diagnosis present

## 2020-10-19 MED ORDER — TRAMADOL HCL 50 MG PO TABS
50.0000 mg | ORAL_TABLET | Freq: Four times a day (QID) | ORAL | 0 refills | Status: DC | PRN
Start: 2020-10-19 — End: 2021-01-30

## 2020-10-19 MED ORDER — NAPROXEN 250 MG PO TABS: 500.0000 mg | ORAL_TABLET | Freq: Two times a day (BID) | ORAL | 0 refills | Status: DC
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
## 2020-10-23 ENCOUNTER — Other Ambulatory Visit: Payer: Self-pay

## 2020-10-23 DIAGNOSIS — I10 Essential (primary) hypertension: Secondary | ICD-10-CM

## 2020-10-23 MED ORDER — AMLODIPINE BESY-BENAZEPRIL HCL 10-40 MG PO CAPS: 1.0000 | ORAL_CAPSULE | Freq: Every day | ORAL | 1 refills | Status: DC

## 2020-11-16 ENCOUNTER — Other Ambulatory Visit: Payer: Self-pay

## 2020-11-16 ENCOUNTER — Ambulatory Visit: Payer: Self-pay | Admitting: Nurse Practitioner

## 2020-11-16 ENCOUNTER — Other Ambulatory Visit: Payer: Self-pay | Admitting: Nurse Practitioner

## 2020-11-16 VITALS — BP 114/84 | HR 85 | Temp 97.2°F | Resp 16 | Ht 72.0 in | Wt 306.9 lb

## 2020-11-16 DIAGNOSIS — J3089 Other allergic rhinitis: Secondary | ICD-10-CM | POA: Diagnosis not present

## 2020-11-16 DIAGNOSIS — J302 Other seasonal allergic rhinitis: Secondary | ICD-10-CM

## 2020-11-16 DIAGNOSIS — E785 Hyperlipidemia, unspecified: Secondary | ICD-10-CM | POA: Diagnosis not present

## 2020-11-16 DIAGNOSIS — K219 Gastro-esophageal reflux disease without esophagitis: Secondary | ICD-10-CM | POA: Diagnosis not present

## 2020-11-16 DIAGNOSIS — I10 Essential (primary) hypertension: Secondary | ICD-10-CM

## 2020-11-16 DIAGNOSIS — N521 Erectile dysfunction due to diseases classified elsewhere: Secondary | ICD-10-CM

## 2020-11-16 MED ORDER — PANTOPRAZOLE SODIUM 40 MG PO TBEC: 40.0000 mg | DELAYED_RELEASE_TABLET | Freq: Every day | ORAL | 1 refills | Status: DC

## 2020-11-16 MED ORDER — SILDENAFIL CITRATE 100 MG PO TABS: 50.0000 mg | ORAL_TABLET | Freq: Every day | ORAL | 11 refills | Status: AC | PRN

## 2020-11-16 MED ORDER — MONTELUKAST SODIUM 10 MG PO TABS: 10.0000 mg | ORAL_TABLET | Freq: Every day | ORAL | 1 refills | Status: DC

## 2020-11-16 MED ORDER — ROSUVASTATIN CALCIUM 20 MG PO TABS: 20.0000 mg | ORAL_TABLET | Freq: Every day | ORAL | 1 refills | Status: DC
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
## 2020-11-17 LAB — LIPID PANEL W/O CHOL/HDL RATIO
Cholesterol, Total: 139 mg/dL (ref 100–199)
HDL: 34 mg/dL — ABNORMAL LOW (ref >39)
LDL Chol Calc (NIH): 83 mg/dL (ref 0–99)
Triglycerides: 118 mg/dL (ref 0–149)
VLDL Cholesterol Cal: 22 mg/dL (ref 5–40)

## 2020-11-17 LAB — CBC, NO DIFFERENTIAL/PLATELET
Hematocrit: 48.4 % (ref 37.5–51.0)
Hemoglobin: 16 g/dL (ref 13.0–17.7)
MCH: 27.9 pg (ref 26.6–33.0)
MCHC: 33.1 g/dL (ref 31.5–35.7)
MCV: 84 fL (ref 79–97)
RBC: 5.74 x10E6/uL (ref 4.14–5.80)
RDW: 13.7 % (ref 11.6–15.4)
WBC: 8.4 10*3/uL (ref 3.4–10.8)

## 2020-11-17 LAB — T4, FREE: Free T4: 1.43 ng/dL (ref 0.82–1.77)

## 2020-11-17 LAB — COMPREHENSIVE METABOLIC PANEL
ALT: 34 IU/L (ref 0–44)
AST: 23 IU/L (ref 0–40)
Albumin/Globulin Ratio: 1.4 (ref 1.2–2.2)
Albumin: 4.3 g/dL (ref 3.8–4.8)
Alkaline Phosphatase: 133 IU/L — ABNORMAL HIGH (ref 44–121)
BUN/Creatinine Ratio: 12 (ref 10–24)
BUN: 10 mg/dL (ref 8–27)
Bilirubin Total: 0.3 mg/dL (ref 0.0–1.2)
CO2: 21 mmol/L (ref 20–29)
Calcium: 9.9 mg/dL (ref 8.6–10.2)
Chloride: 103 mmol/L (ref 96–106)
Creatinine, Ser: 0.86 mg/dL (ref 0.76–1.27)
GFR calc Af Amer: 107 mL/min/{1.73_m2} (ref >59)
GFR calc non Af Amer: 92 mL/min/{1.73_m2} (ref >59)
Globulin, Total: 3 g/dL (ref 1.5–4.5)
Glucose: 100 mg/dL — ABNORMAL HIGH (ref 65–99)
Potassium: 4.9 mmol/L (ref 3.5–5.2)
Sodium: 140 mmol/L (ref 134–144)
Total Protein: 7.3 g/dL (ref 6.0–8.5)

## 2020-11-17 LAB — TSH: TSH: 2.01 u[IU]/mL (ref 0.450–4.500)

## 2020-11-17 LAB — PSA: Prostate Specific Ag, Serum: 3.1 ng/mL (ref 0.0–4.0)

## 2020-11-26 NOTE — Progress Notes (Signed)
Reviewed with patient during visit
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
## 2020-12-18 ENCOUNTER — Encounter: Payer: Self-pay | Admitting: Nurse Practitioner

## 2021-01-30 ENCOUNTER — Other Ambulatory Visit: Payer: Self-pay

## 2021-01-30 ENCOUNTER — Ambulatory Visit (INDEPENDENT_AMBULATORY_CARE_PROVIDER_SITE_OTHER): Payer: BLUE CROSS/BLUE SHIELD | Admitting: Internal Medicine

## 2021-01-30 ENCOUNTER — Encounter: Payer: Self-pay | Admitting: Hospice and Palliative Medicine

## 2021-01-30 VITALS — BP 130/84 | HR 84 | Temp 97.8°F | Resp 16 | Ht 72.0 in | Wt 307.0 lb

## 2021-01-30 DIAGNOSIS — E782 Mixed hyperlipidemia: Secondary | ICD-10-CM

## 2021-01-30 DIAGNOSIS — Z6841 Body Mass Index (BMI) 40.0 and over, adult: Secondary | ICD-10-CM | POA: Diagnosis not present

## 2021-01-30 DIAGNOSIS — K219 Gastro-esophageal reflux disease without esophagitis: Secondary | ICD-10-CM

## 2021-01-30 DIAGNOSIS — I10 Essential (primary) hypertension: Secondary | ICD-10-CM

## 2021-01-30 MED ORDER — AMLODIPINE BESY-BENAZEPRIL HCL 10-40 MG PO CAPS: 1.0000 | ORAL_CAPSULE | Freq: Every day | ORAL | 1 refills | Status: DC

## 2021-01-30 NOTE — Progress Notes (Signed)
[facility] [address] [place], [place] [address]  Internal MEDICINE  Office Visit Note  Patient Name: Shaun Patrick  [id number]  [id number]  Date of Service: 01/30/2021  Chief Complaint  Patient presents with  . Follow-up    medication  . Gastroesophageal Reflux  . Hypertension    HPI Pt is here for routine follow up. Denies any major problems, compliant with medications intake, denies any c/p or sob hypertension is well controlled  Concerned about wt gain and is willing to look into diet and portion control Allergies do bother him once in a while   Current Medication: Outpatient Encounter Medications as of 01/30/2021  Medication Sig Note  . aspirin 81 MG chewable tablet CHEW 1 TABLET DAILY   . montelukast (SINGULAIR) 10 MG tablet Take 1 tablet (10 mg total) by mouth daily.   . Multiple Vitamin (MULTI-VITAMINS) TABS Take 1 tablet by mouth 1 day or 1 dose. 05/16/2015: Received from: [facility] Received Sig: Take by mouth.  . pantoprazole (PROTONIX) 40 MG tablet Take 1 tablet (40 mg total) by mouth daily.   . rosuvastatin (CRESTOR) 20 MG tablet Take 1 tablet (20 mg total) by mouth daily.   . sildenafil (VIAGRA) 100 MG tablet Take 0.5-1 tablets (50-100 mg total) by mouth daily as needed for erectile dysfunction.   . [DISCONTINUED] amLODipine-benazepril (LOTREL) 10-40 MG capsule Take 1 capsule by mouth daily.   Marland Kitchen amLODipine-benazepril (LOTREL) 10-40 MG capsule Take 1 capsule by mouth daily.   . [DISCONTINUED] naproxen (NAPROSYN) 250 MG tablet Take 2 tablets (500 mg total) by mouth 2 (two) times daily with a meal. (Patient not taking: Reported on 01/30/2021)   . [DISCONTINUED] saw palmetto 160 MG capsule Take 160 mg by mouth daily.   . [DISCONTINUED] traMADol (ULTRAM) 50 MG tablet Take 1 tablet (50 mg total) by mouth every 6 (six) hours as needed.    No facility-administered encounter medications on file as of 01/30/2021.    Surgical History: Past  Surgical History:  Procedure Laterality Date  . HERNIA REPAIR     umbilical  . JOINT REPLACEMENT Bilateral    knees  . KNEE SURGERY Bilateral    knee replacements  . NASAL SEPTOPLASTY W/ TURBINOPLASTY Bilateral 10/17/2015   Procedure: NASAL SEPTOPLASTY WITH TURBINATE REDUCTION;  Surgeon: Linus Salmons, MD;  Location: [facility] ORS;  Service: ENT;  Laterality: Bilateral;  . NECK SURGERY  1979   broke neck  . TONSILECTOMY, ADENOIDECTOMY, BILATERAL MYRINGOTOMY AND TUBES    . TONSILLECTOMY      Medical History: Past Medical History:  Diagnosis Date  . GERD (gastroesophageal reflux disease)   . Hypertension     Family History: Family History  Problem Relation Age of Onset  . Stroke Mother   . Heart disease Mother   . Hypertension Mother   . Liver disease Father   . Hypertension Sister     Social History   Socioeconomic History  . Marital status: Married    Spouse name: Not on file  . Number of children: Not on file  . Years of education: Not on file  . Highest education level: Not on file  Occupational History  . Occupation: med Games developer: [employer]  Tobacco Use  . Smoking status: Former Smoker    Years: 10.00    Types: Cigarettes    Start date: 12/03/1983    Quit date: 12/02/1993    Years since quitting: 27.1  .  Smokeless tobacco: Never Used  Substance and Sexual Activity  . Alcohol use: Yes    Alcohol/week: 4.0 standard drinks    Types: 4 Cans of beer per week  . Drug use: No  . Sexual activity: Never  Other Topics Concern  . Not on file  Social History Narrative  . Not on file   Social Determinants of Health   Financial Resource Strain: Not on file  Food Insecurity: Not on file  Transportation Needs: Not on file  Physical Activity: Not on file  Stress: Not on file  Social Connections: Not on file  Intimate Partner Violence: Not on file      Review of Systems  Constitutional: Negative for chills, fatigue and  unexpected weight change.  HENT: Negative for congestion, postnasal drip, rhinorrhea, sneezing and sore throat.   Eyes: Negative for redness.  Respiratory: Negative for cough, chest tightness and shortness of breath.   Cardiovascular: Negative for chest pain and palpitations.  Gastrointestinal: Negative for abdominal pain, constipation, diarrhea, nausea and vomiting.  Genitourinary: Negative for dysuria and frequency.  Musculoskeletal: Negative for arthralgias, back pain, joint swelling and neck pain.  Skin: Negative for rash.  Neurological: Negative.  Negative for tremors and numbness.  Hematological: Negative for adenopathy. Does not bruise/bleed easily.  Psychiatric/Behavioral: Negative for behavioral problems (Depression), sleep disturbance and suicidal ideas. The patient is not nervous/anxious.     Vital Signs: BP 130/84   Pulse 84   Temp 97.8 F (36.6 C)   Resp 16   Ht 6' (1.829 m)   Wt (!) 307 lb (139.3 kg)   SpO2 98%   BMI 41.64 kg/m    Physical Exam Constitutional:      General: He is not in acute distress.    Appearance: He is well-developed. He is not diaphoretic.  HENT:     Head: Normocephalic and atraumatic.     Mouth/Throat:     Pharynx: No oropharyngeal exudate.  Eyes:     Pupils: Pupils are equal, round, and reactive to light.  Neck:     Thyroid: No thyromegaly.     Vascular: No JVD.     Trachea: No tracheal deviation.  Cardiovascular:     Rate and Rhythm: Normal rate and regular rhythm.     Heart sounds: Normal heart sounds. No murmur heard. No friction rub. No gallop.   Pulmonary:     Effort: Pulmonary effort is normal. No respiratory distress.     Breath sounds: No wheezing or rales.  Chest:     Chest wall: No tenderness.  Abdominal:     General: Bowel sounds are normal.     Palpations: Abdomen is soft.  Musculoskeletal:        General: Normal range of motion.     Cervical back: Normal range of motion and neck supple.  Lymphadenopathy:      Cervical: No cervical adenopathy.  Skin:    General: Skin is warm and dry.  Neurological:     Mental Status: He is alert and oriented to person, place, and time.     Cranial Nerves: No cranial nerve deficit.  Psychiatric:        Behavior: Behavior normal.        Thought Content: Thought content normal.        Judgment: Judgment normal.        Assessment/Plan: 1. Benign hypertension Controlled with meds. Will continue as before  - amLODipine-benazepril (LOTREL) 10-40 MG capsule; Take 1 capsule by mouth daily.  Dispense: 90 capsule; Refill: 1  2. Mixed hyperlipidemia Controlled with Lipitor   3. Gastroesophageal reflux disease without esophagitis Controlled // will be due for colonoscopy// cologuard   4. Morbid obesity with body mass index (BMI) of 40.0 to 44.9 in adult [facility]) Obesity Counseling: Risk Assessment: An assessment of behavioral risk factors was made today and includes lack of exercise sedentary lifestyle, lack of portion control and poor dietary habits.  Risk Modification Advice: She was counseled on portion control guidelines. Restricting daily caloric intake to 1800 The detrimental long term effects of obesity on her health and ongoing poor compliance was also discussed with the patient.   Might need to look into OSA // ECHO // repeat CXR   General Counseling: Arthor verbalizes understanding of the findings of todays visit and agrees with plan of treatment. I have discussed any further diagnostic evaluation that may be needed or ordered today. We also reviewed his medications today. he has been encouraged to call the office with any questions or concerns that should arise related to todays visit.   Meds ordered this encounter  Medications  . amLODipine-benazepril (LOTREL) 10-40 MG capsule    Sig: Take 1 capsule by mouth daily.    Dispense:  90 capsule    Refill:  1    Total time spent:30 Minutes Time spent includes review of chart, medications, test  results, and follow up plan with the patient.   [place] Controlled Substance Database was reviewed by me.   Dr Lyndon Code Internal medicine

## 2021-02-07 ENCOUNTER — Telehealth: Payer: Self-pay

## 2021-02-07 NOTE — Telephone Encounter (Signed)
-----   Message from Lyndon Code, MD sent at 02/05/2021 11:33 AM EST ----- Cologaurd

## 2021-02-07 NOTE — Telephone Encounter (Signed)
Faxed cologaurd request to [employer] 02/07/21

## 2021-02-20 LAB — COLOGUARD: COLOGUARD: NEGATIVE

## 2021-02-27 ENCOUNTER — Telehealth: Payer: Self-pay

## 2021-02-27 NOTE — Telephone Encounter (Signed)
Spoke to pt, informed pt cologaurd is negative

## 2021-03-05 LAB — COLOGUARD

## 2021-03-19 ENCOUNTER — Other Ambulatory Visit: Payer: Self-pay

## 2021-03-19 DIAGNOSIS — K219 Gastro-esophageal reflux disease without esophagitis: Secondary | ICD-10-CM

## 2021-03-19 MED ORDER — PANTOPRAZOLE SODIUM 40 MG PO TBEC: 40.0000 mg | DELAYED_RELEASE_TABLET | Freq: Every day | ORAL | 1 refills | Status: DC

## 2021-03-20 ENCOUNTER — Ambulatory Visit: Payer: BLUE CROSS/BLUE SHIELD | Admitting: Hospice and Palliative Medicine

## 2021-04-03 ENCOUNTER — Telehealth: Payer: Self-pay

## 2021-04-03 DIAGNOSIS — K219 Gastro-esophageal reflux disease without esophagitis: Secondary | ICD-10-CM

## 2021-04-03 MED ORDER — PANTOPRAZOLE SODIUM 40 MG PO TBEC: 40.0000 mg | DELAYED_RELEASE_TABLET | Freq: Every day | ORAL | 1 refills | Status: DC

## 2021-04-03 NOTE — Telephone Encounter (Signed)
PA approved on 04/02/21 and valid from 04/02/21 to 04/01/22.  New rx sent to pharmacy

## 2021-05-04 ENCOUNTER — Other Ambulatory Visit: Payer: Self-pay

## 2021-05-04 DIAGNOSIS — J302 Other seasonal allergic rhinitis: Secondary | ICD-10-CM

## 2021-05-04 DIAGNOSIS — I10 Essential (primary) hypertension: Secondary | ICD-10-CM

## 2021-05-04 MED ORDER — MONTELUKAST SODIUM 10 MG PO TABS: 10.0000 mg | ORAL_TABLET | Freq: Every day | ORAL | 1 refills | Status: DC

## 2021-05-04 MED ORDER — AMLODIPINE BESY-BENAZEPRIL HCL 10-40 MG PO CAPS: 1.0000 | ORAL_CAPSULE | Freq: Every day | ORAL | 1 refills | Status: DC

## 2021-05-18 ENCOUNTER — Ambulatory Visit (INDEPENDENT_AMBULATORY_CARE_PROVIDER_SITE_OTHER): Payer: BLUE CROSS/BLUE SHIELD | Admitting: Physician Assistant

## 2021-05-18 ENCOUNTER — Other Ambulatory Visit: Payer: Self-pay

## 2021-05-18 ENCOUNTER — Encounter: Payer: Self-pay | Admitting: Physician Assistant

## 2021-05-18 DIAGNOSIS — L989 Disorder of the skin and subcutaneous tissue, unspecified: Secondary | ICD-10-CM

## 2021-05-18 DIAGNOSIS — L089 Local infection of the skin and subcutaneous tissue, unspecified: Secondary | ICD-10-CM

## 2021-05-18 MED ORDER — DOXYCYCLINE HYCLATE 100 MG PO TABS: 100.0000 mg | ORAL_TABLET | Freq: Two times a day (BID) | ORAL | 0 refills | Status: DC

## 2021-05-18 NOTE — Progress Notes (Signed)
[facility] [address] [place], [place] [address]  Internal MEDICINE  Office Visit Note  Patient Name: Shaun Patrick  [id number]  [id number]  Date of Service: 05/20/2021  Chief Complaint  Patient presents with   Acute Visit    Bumps on inside shell of right ear, noticed it about a week ago   Quality Metric Gaps    Shingrix, covid booster     HPI Pt is here for a sick visit. -Pt has had 2 bumps inside right outer ear. These have been there for about 2 weeks. The higher bump has a white head to it and pt reports his wife tried to get pus out of it unsuccessfully. He reports that the bumps are tender to the touch and dont seem to be changing. Never experienced this before. Does not itch.  Current Medication:  Outpatient Encounter Medications as of 05/18/2021  Medication Sig Note   amLODipine-benazepril (LOTREL) 10-40 MG capsule Take 1 capsule by mouth daily.    aspirin 81 MG chewable tablet CHEW 1 TABLET DAILY    doxycycline (VIBRA-TABS) 100 MG tablet Take 1 tablet (100 mg total) by mouth 2 (two) times daily.    montelukast (SINGULAIR) 10 MG tablet Take 1 tablet (10 mg total) by mouth daily.    Multiple Vitamin (MULTI-VITAMINS) TABS Take 1 tablet by mouth 1 day or 1 dose. 05/16/2015: Received from: [facility] Received Sig: Take by mouth.   pantoprazole (PROTONIX) 40 MG tablet Take 1 tablet (40 mg total) by mouth daily.    rosuvastatin (CRESTOR) 20 MG tablet Take 1 tablet (20 mg total) by mouth daily.    sildenafil (VIAGRA) 100 MG tablet Take 0.5-1 tablets (50-100 mg total) by mouth daily as needed for erectile dysfunction.    No facility-administered encounter medications on file as of 05/18/2021.      Medical History: Past Medical History:  Diagnosis Date   GERD (gastroesophageal reflux disease)    Hypertension      Vital Signs: BP 140/86   Pulse 95   Temp (!) 97.3 F (36.3 C)   Resp 16   Ht 6' (1.829 m)   Wt (!) 308 lb (139.7  kg)   SpO2 98%   BMI 41.77 kg/m    Review of Systems  Constitutional:  Negative for fatigue and fever.  HENT:  Positive for ear pain. Negative for congestion, mouth sores and postnasal drip.        Bumps along outer ear are tender, no problem inside ear/canal  Respiratory:  Negative for cough.   Cardiovascular:  Negative for chest pain.  Genitourinary:  Negative for flank pain.  Psychiatric/Behavioral: Negative.     Physical Exam Vitals and nursing note reviewed.  Constitutional:      General: He is not in acute distress.    Appearance: He is well-developed. He is obese. He is not diaphoretic.  HENT:     Head: Normocephalic and atraumatic.     Left Ear: External ear normal.     Ears:     Comments: 2 small red bumps along right auricle. Top bump is red with white head, lower bump is smaller without any white head    Mouth/Throat:     Pharynx: No oropharyngeal exudate.  Eyes:     Pupils: Pupils are equal, round, and reactive to light.  Neck:     Thyroid: No thyromegaly.     Vascular: No JVD.     Trachea: No tracheal deviation.  Cardiovascular:     Rate and Rhythm: Normal rate and regular rhythm.     Heart sounds: Normal heart sounds. No murmur heard.   No friction rub. No gallop.  Pulmonary:     Effort: Pulmonary effort is normal. No respiratory distress.     Breath sounds: No wheezing or rales.  Chest:     Chest wall: No tenderness.  Abdominal:     General: Bowel sounds are normal.     Palpations: Abdomen is soft.  Musculoskeletal:        General: Normal range of motion.     Cervical back: Normal range of motion and neck supple.  Lymphadenopathy:     Cervical: No cervical adenopathy.  Skin:    General: Skin is warm and dry.  Neurological:     Mental Status: He is alert and oriented to person, place, and time.     Cranial Nerves: No cranial nerve deficit.  Psychiatric:        Behavior: Behavior normal.        Thought Content: Thought content normal.         Judgment: Judgment normal.      Assessment/Plan: 1. Skin pustule Will start on doxy to help clear pustule. Will refer to dermatology in case this does not resolve with doxy. - doxycycline (VIBRA-TABS) 100 MG tablet; Take 1 tablet (100 mg total) by mouth 2 (two) times daily.  Dispense: 28 tablet; Refill: 0 - Ambulatory referral to Dermatology  2. Bumps on skin Start on doxy, refer to derm for further eval and management if not improving - doxycycline (VIBRA-TABS) 100 MG tablet; Take 1 tablet (100 mg total) by mouth 2 (two) times daily.  Dispense: 28 tablet; Refill: 0 - Ambulatory referral to Dermatology   General Counseling: gina costilla understanding of the findings of todays visit and agrees with plan of treatment. I have discussed any further diagnostic evaluation that may be needed or ordered today. We also reviewed his medications today. he has been encouraged to call the office with any questions or concerns that should arise related to todays visit.    Counseling:    Orders Placed This Encounter  Procedures   Ambulatory referral to Dermatology    Meds ordered this encounter  Medications   doxycycline (VIBRA-TABS) 100 MG tablet    Sig: Take 1 tablet (100 mg total) by mouth 2 (two) times daily.    Dispense:  28 tablet    Refill:  0    Time spent:30 Minutes

## 2021-07-19 ENCOUNTER — Ambulatory Visit (INDEPENDENT_AMBULATORY_CARE_PROVIDER_SITE_OTHER): Payer: BLUE CROSS/BLUE SHIELD | Admitting: Physician Assistant

## 2021-07-19 ENCOUNTER — Encounter: Payer: Self-pay | Admitting: Physician Assistant

## 2021-07-19 ENCOUNTER — Other Ambulatory Visit: Payer: Self-pay

## 2021-07-19 ENCOUNTER — Encounter: Payer: BLUE CROSS/BLUE SHIELD | Admitting: Physician Assistant

## 2021-07-19 DIAGNOSIS — I10 Essential (primary) hypertension: Secondary | ICD-10-CM | POA: Diagnosis not present

## 2021-07-19 DIAGNOSIS — R300 Dysuria: Secondary | ICD-10-CM

## 2021-07-19 DIAGNOSIS — E785 Hyperlipidemia, unspecified: Secondary | ICD-10-CM | POA: Diagnosis not present

## 2021-07-19 DIAGNOSIS — Z0001 Encounter for general adult medical examination with abnormal findings: Secondary | ICD-10-CM | POA: Diagnosis not present

## 2021-07-19 DIAGNOSIS — Z6841 Body Mass Index (BMI) 40.0 and over, adult: Secondary | ICD-10-CM

## 2021-07-19 DIAGNOSIS — K219 Gastro-esophageal reflux disease without esophagitis: Secondary | ICD-10-CM | POA: Diagnosis not present

## 2021-07-19 MED ORDER — PANTOPRAZOLE SODIUM 40 MG PO TBEC: 40.0000 mg | DELAYED_RELEASE_TABLET | Freq: Every day | ORAL | 1 refills | Status: DC

## 2021-07-19 MED ORDER — ROSUVASTATIN CALCIUM 20 MG PO TABS: 20.0000 mg | ORAL_TABLET | Freq: Every day | ORAL | 1 refills | Status: DC

## 2021-07-19 MED ORDER — AMLODIPINE BESY-BENAZEPRIL HCL 10-40 MG PO CAPS: 1.0000 | ORAL_CAPSULE | Freq: Every day | ORAL | 1 refills | Status: DC

## 2021-07-19 NOTE — Progress Notes (Signed)
[facility] [address] [place], [place] [address]  Internal MEDICINE  Office Visit Note  Patient Name: Shaun Patrick  [id number]  [id number]  Date of Service: 07/24/2021  Chief Complaint  Patient presents with   Annual Exam   Hypertension     HPI Pt is here for routine health maintenance examination and has no complaints today -BP 130s/70s at home -Does not exercise regularly. Does mow the lawn and walks around stores. Does not walk regularly though. Used to go to the track to walk but stopped and will retry now. -Sleeping well -Healthy appetite, but does like his [employer] at times.  -2-3 beers per day now that he is retired and educated on limiting alcohol intake -Unsure if he had the shingles vaccine and will check on this -He does need some refills today -UTD on colon cancer screening with cologuard  Current Medication: Outpatient Encounter Medications as of 07/19/2021  Medication Sig Note   aspirin 81 MG chewable tablet CHEW 1 TABLET DAILY    doxycycline (VIBRA-TABS) 100 MG tablet Take 1 tablet (100 mg total) by mouth 2 (two) times daily.    montelukast (SINGULAIR) 10 MG tablet Take 1 tablet (10 mg total) by mouth daily.    Multiple Vitamin (MULTI-VITAMINS) TABS Take 1 tablet by mouth 1 day or 1 dose. 05/16/2015: Received from: [facility] Received Sig: Take by mouth.   sildenafil (VIAGRA) 100 MG tablet Take 0.5-1 tablets (50-100 mg total) by mouth daily as needed for erectile dysfunction.    [DISCONTINUED] amLODipine-benazepril (LOTREL) 10-40 MG capsule Take 1 capsule by mouth daily.    [DISCONTINUED] pantoprazole (PROTONIX) 40 MG tablet Take 1 tablet (40 mg total) by mouth daily.    [DISCONTINUED] rosuvastatin (CRESTOR) 20 MG tablet Take 1 tablet (20 mg total) by mouth daily.    amLODipine-benazepril (LOTREL) 10-40 MG capsule Take 1 capsule by mouth daily.    pantoprazole (PROTONIX) 40 MG tablet Take 1 tablet (40 mg total) by mouth daily.     rosuvastatin (CRESTOR) 20 MG tablet Take 1 tablet (20 mg total) by mouth daily.    No facility-administered encounter medications on file as of 07/19/2021.    Surgical History: Past Surgical History:  Procedure Laterality Date   HERNIA REPAIR     umbilical   JOINT REPLACEMENT Bilateral    knees   KNEE SURGERY Bilateral    knee replacements   NASAL SEPTOPLASTY W/ TURBINOPLASTY Bilateral 10/17/2015   Procedure: NASAL SEPTOPLASTY WITH TURBINATE REDUCTION;  Surgeon: Linus Salmons, MD;  Location: [facility] ORS;  Service: ENT;  Laterality: Bilateral;   NECK SURGERY  1979   broke neck   TONSILECTOMY, ADENOIDECTOMY, BILATERAL MYRINGOTOMY AND TUBES     TONSILLECTOMY      Medical History: Past Medical History:  Diagnosis Date   GERD (gastroesophageal reflux disease)    Hypertension     Family History: Family History  Problem Relation Age of Onset   Stroke Mother    Heart disease Mother    Hypertension Mother    Liver disease Father    Hypertension Sister       Review of Systems  Constitutional:  Negative for chills, fatigue and unexpected weight change.  HENT:  Negative for congestion, postnasal drip, rhinorrhea, sneezing and sore throat.   Eyes:  Negative for redness.  Respiratory:  Negative for cough, chest tightness and shortness of breath.   Cardiovascular:  Negative for chest pain and palpitations.  Gastrointestinal:  Negative for abdominal pain,  constipation, diarrhea, nausea and vomiting.  Genitourinary:  Negative for dysuria and frequency.  Musculoskeletal:  Negative for arthralgias, back pain, joint swelling and neck pain.  Skin:  Negative for rash.  Neurological: Negative.  Negative for tremors and numbness.  Hematological:  Negative for adenopathy. Does not bruise/bleed easily.  Psychiatric/Behavioral:  Negative for behavioral problems (Depression), sleep disturbance and suicidal ideas. The patient is not nervous/anxious.     Vital Signs: BP 130/90 Comment:  142/90  Pulse 82   Temp 97.8 F (36.6 C)   Resp 16   Ht 6' (1.829 m)   Wt (!) 304 lb (137.9 kg)   SpO2 98%   BMI 41.23 kg/m    Physical Exam Vitals and nursing note reviewed.  Constitutional:      General: He is not in acute distress.    Appearance: He is well-developed. He is obese. He is not diaphoretic.  HENT:     Head: Normocephalic and atraumatic.     Right Ear: External ear normal.     Left Ear: External ear normal.     Nose: Nose normal.     Mouth/Throat:     Pharynx: No oropharyngeal exudate.  Eyes:     General: No scleral icterus.       Right eye: No discharge.        Left eye: No discharge.     Conjunctiva/sclera: Conjunctivae normal.     Pupils: Pupils are equal, round, and reactive to light.  Neck:     Thyroid: No thyromegaly.     Vascular: No JVD.     Trachea: No tracheal deviation.  Cardiovascular:     Rate and Rhythm: Normal rate and regular rhythm.     Heart sounds: Normal heart sounds. No murmur heard.   No friction rub. No gallop.  Pulmonary:     Effort: Pulmonary effort is normal. No respiratory distress.     Breath sounds: Normal breath sounds. No stridor. No wheezing or rales.  Chest:     Chest wall: No tenderness.  Abdominal:     General: Bowel sounds are normal. There is no distension.     Palpations: Abdomen is soft. There is no mass.     Tenderness: There is no abdominal tenderness. There is no guarding or rebound.  Musculoskeletal:        General: No tenderness or deformity. Normal range of motion.     Cervical back: Normal range of motion and neck supple.  Lymphadenopathy:     Cervical: No cervical adenopathy.  Skin:    General: Skin is warm and dry.     Coloration: Skin is not pale.     Findings: No erythema or rash.  Neurological:     Mental Status: He is alert.     Cranial Nerves: No cranial nerve deficit.     Motor: No abnormal muscle tone.     Coordination: Coordination normal.     Deep Tendon Reflexes: Reflexes are normal  and symmetric.  Psychiatric:        Behavior: Behavior normal.        Thought Content: Thought content normal.        Judgment: Judgment normal.     LABS: Recent Results (from the past 2160 hour(s))  UA/M w/rflx Culture, Routine     Status: None   Collection Time: 07/19/21  3:57 PM   Specimen: Urine   Urine  Result Value Ref Range   Specific Gravity, UA 1.022 1.005 - 1.030  pH, UA 5.0 5.0 - 7.5   Color, UA Yellow Yellow   Appearance Ur Clear Clear   Leukocytes,UA Negative Negative   Protein,UA Negative Negative/Trace   Glucose, UA Negative Negative   Ketones, UA Negative Negative   RBC, UA Negative Negative   Bilirubin, UA Negative Negative   Urobilinogen, Ur 1.0 0.2 - 1.0 mg/dL   Nitrite, UA Negative Negative   Microscopic Examination Comment     Comment: Microscopic follows if indicated.   Microscopic Examination See below:     Comment: Microscopic was indicated and was performed.   Urinalysis Reflex Comment     Comment: This specimen will not reflex to a Urine Culture.  Microscopic Examination     Status: None   Collection Time: 07/19/21  3:57 PM   Urine  Result Value Ref Range   WBC, UA None seen 0 - 5 /hpf   RBC 0-2 0 - 2 /hpf   Epithelial Cells (non renal) None seen 0 - 10 /hpf   Casts None seen None seen /lpf   Bacteria, UA None seen None seen/Few       Assessment/Plan: 1. Encounter for general adult medical examination with abnormal findings CPE performed, patient is up-to-date on colon cancer screening with negative Cologuard this year  2. Benign hypertension Stable, continue current medications and refill sent today - amLODipine-benazepril (LOTREL) 10-40 MG capsule; Take 1 capsule by mouth daily.  Dispense: 90 capsule; Refill: 1  3. Gastroesophageal reflux disease without esophagitis Stable we will continue Protonix--patient requesting refill - pantoprazole (PROTONIX) 40 MG tablet; Take 1 tablet (40 mg total) by mouth daily.  Dispense: 90 tablet;  Refill: 1  4. Dyslipidemia Will continue Crestor - rosuvastatin (CRESTOR) 20 MG tablet; Take 1 tablet (20 mg total) by mouth daily.  Dispense: 90 tablet; Refill: 1  5. Dysuria - UA/M w/rflx Culture, Routine  6. Morbid obesity with body mass index (BMI) of 40.0 to 44.9 in adult [facility]) Obesity Counseling: Had a lengthy discussion regarding patients BMI and weight issues. Patient was instructed on portion control as well as increased activity. Also discussed caloric restrictions with trying to maintain intake less than 2000 Kcal. Discussions were made in accordance with the 5As of weight management. Simple actions such as not eating late and if able to, taking a walk is suggested.    General Counseling: temitayo covalt understanding of the findings of todays visit and agrees with plan of treatment. I have discussed any further diagnostic evaluation that may be needed or ordered today. We also reviewed his medications today. he has been encouraged to call the office with any questions or concerns that should arise related to todays visit.    Counseling:    Orders Placed This Encounter  Procedures   Microscopic Examination   UA/M w/rflx Culture, Routine    Meds ordered this encounter  Medications   amLODipine-benazepril (LOTREL) 10-40 MG capsule    Sig: Take 1 capsule by mouth daily.    Dispense:  90 capsule    Refill:  1   pantoprazole (PROTONIX) 40 MG tablet    Sig: Take 1 tablet (40 mg total) by mouth daily.    Dispense:  90 tablet    Refill:  1    PA approved on 04/02/21 and valid from 04/02/21 to 04/01/22.   rosuvastatin (CRESTOR) 20 MG tablet    Sig: Take 1 tablet (20 mg total) by mouth daily.    Dispense:  90 tablet    Refill:  1  This patient was seen by Lynn Ito, PA-C in collaboration with Dr. Beverely Risen as a part of collaborative care agreement.  Total time spent:35 Minutes  Time spent includes review of chart, medications, test results, and follow up plan  with the patient.     Lyndon Code, MD  Internal Medicine

## 2021-07-20 LAB — UA/M W/RFLX CULTURE, ROUTINE
Bilirubin, UA: NEGATIVE
Glucose, UA: NEGATIVE
Ketones, UA: NEGATIVE
Leukocytes,UA: NEGATIVE
Nitrite, UA: NEGATIVE
Protein,UA: NEGATIVE
RBC, UA: NEGATIVE
Specific Gravity, UA: 1.022 (ref 1.005–1.030)
Urobilinogen, Ur: 1 mg/dL (ref 0.2–1.0)
pH, UA: 5 (ref 5.0–7.5)

## 2021-07-20 LAB — MICROSCOPIC EXAMINATION
Bacteria, UA: NONE SEEN
Casts: NONE SEEN /lpf
Epithelial Cells (non renal): NONE SEEN /hpf (ref 0–10)
WBC, UA: NONE SEEN /hpf (ref 0–5)

## 2021-07-23 ENCOUNTER — Encounter: Payer: BLUE CROSS/BLUE SHIELD | Admitting: Physician Assistant

## 2021-07-24 NOTE — Patient Instructions (Signed)
Obesity, Adult Obesity is having too much body fat. Being obese means that your weight is morethan what is healthy for you. BMI is a number that explains how much body fat you have. If you have a BMI of 30 or more, you are obese. Obesity is often caused by eating or drinking morecalories than your body uses. Changing your lifestyle can help you lose weight. Obesity can cause serious health problems, such as: Stroke. Coronary artery disease (CAD). Type 2 diabetes. Some types of cancer, including cancers of the colon, breast, uterus, and gallbladder. Osteoarthritis. High blood pressure (hypertension). High cholesterol. Sleep apnea. Gallbladder stones. Infertility problems. What are the causes? Eating meals each day that are high in calories, sugar, and fat. Being born with genes that may make you more likely to become obese. Having a medical condition that causes obesity. Taking certain medicines. Sitting a lot (having a sedentary lifestyle). Not getting enough sleep. Drinking a lot of drinks that have sugar in them. What increases the risk? Having a family history of obesity. Being an African American woman. Being a Hispanic man. Living in an area with limited access to: Parks, recreation centers, or sidewalks. Healthy food choices, such as grocery stores and farmers' markets. What are the signs or symptoms? The main sign is having too much body fat. How is this treated? Treatment for this condition often includes changing your lifestyle. Treatment may include: Changing your diet. This may include making a healthy meal plan. Exercise. This may include activity that causes your heart to beat faster (aerobic exercise) and strength training. Work with your doctor to design a program that works for you. Medicine to help you lose weight. This may be used if you are not able to lose 1 pound a week after 6 weeks of healthy eating and more exercise. Treating conditions that cause the  obesity. Surgery. Options may include gastric banding and gastric bypass. This may be done if: Other treatments have not helped to improve your condition. You have a BMI of 40 or higher. You have life-threatening health problems related to obesity. Follow these instructions at home: Eating and drinking  Follow advice from your doctor about what to eat and drink. Your doctor may tell you to: Limit fast food, sweets, and processed snack foods. Choose low-fat options. For example, choose low-fat milk instead of whole milk. Eat 5 or more servings of fruits or vegetables each day. Eat at home more often. This gives you more control over what you eat. Choose healthy foods when you eat out. Learn to read food labels. This will help you learn how much food is in 1 serving. Keep low-fat snacks available. Avoid drinks that have a lot of sugar in them. These include soda, fruit juice, iced tea with sugar, and flavored milk. Drink enough water to keep your pee (urine) pale yellow. Do not go on fad diets.  Physical activity Exercise often, as told by your doctor. Most adults should get up to 150 minutes of moderate-intensity exercise every week.Ask your doctor: What types of exercise are safe for you. How often you should exercise. Warm up and stretch before being active. Do slow stretching after being active (cool down). Rest between times of being active. Lifestyle Work with your doctor and a food expert (dietitian) to set a weight-loss goal that is best for you. Limit your screen time. Find ways to reward yourself that do not involve food. Do not drink alcohol if: Your doctor tells you not to drink.   You are pregnant, may be pregnant, or are planning to become pregnant. If you drink alcohol: Limit how much you use to: 0-1 drink a day for women. 0-2 drinks a day for men. Be aware of how much alcohol is in your drink. In the [place] one drink equals one 12 oz bottle of beer (355 mL), one 5 oz  glass of wine (148 mL), or one 1 oz glass of hard liquor (44 mL). General instructions Keep a weight-loss journal. This can help you keep track of: The food that you eat. How much exercise you get. Take over-the-counter and prescription medicines only as told by your doctor. Take vitamins and supplements only as told by your doctor. Think about joining a support group. Keep all follow-up visits as told by your doctor. This is important. Contact a doctor if: You cannot meet your weight loss goal after you have changed your diet and lifestyle for 6 weeks. Get help right away if you: Are having trouble breathing. Are having thoughts of harming yourself. Summary Obesity is having too much body fat. Being obese means that your weight is more than what is healthy for you. Work with your doctor to set a weight-loss goal. Get regular exercise as told by your doctor. This information is not intended to replace advice given to you by your health care provider. Make sure you discuss any questions you have with your healthcare provider. Document Revised: 07/23/2018 Document Reviewed: 07/23/2018 Elsevier Patient Education  2022 Elsevier Inc.  

## 2021-09-19 ENCOUNTER — Other Ambulatory Visit: Payer: Self-pay

## 2021-09-19 ENCOUNTER — Ambulatory Visit (INDEPENDENT_AMBULATORY_CARE_PROVIDER_SITE_OTHER): Payer: BLUE CROSS/BLUE SHIELD

## 2021-09-19 DIAGNOSIS — Z23 Encounter for immunization: Secondary | ICD-10-CM | POA: Diagnosis not present

## 2021-10-07 ENCOUNTER — Encounter: Payer: Self-pay | Admitting: Emergency Medicine

## 2021-10-07 ENCOUNTER — Other Ambulatory Visit: Payer: Self-pay

## 2021-10-07 DIAGNOSIS — Z5321 Procedure and treatment not carried out due to patient leaving prior to being seen by health care provider: Secondary | ICD-10-CM | POA: Diagnosis present

## 2021-10-07 LAB — CBC WITH DIFFERENTIAL/PLATELET
Abs Immature Granulocytes: 0.04 10*3/uL (ref 0.00–0.07)
Basophils Absolute: 0.1 10*3/uL (ref 0.0–0.1)
Basophils Relative: 1 %
Eosinophils Absolute: 0.1 10*3/uL (ref 0.0–0.5)
Eosinophils Relative: 1 %
HCT: 50.8 % (ref 39.0–52.0)
Hemoglobin: 15.9 g/dL (ref 13.0–17.0)
Immature Granulocytes: 0 %
Lymphocytes Relative: 29 %
Lymphs Abs: 2.7 10*3/uL (ref 0.7–4.0)
MCH: 27.5 pg (ref 26.0–34.0)
MCHC: 31.3 g/dL (ref 30.0–36.0)
MCV: 87.9 fL (ref 80.0–100.0)
Monocytes Absolute: 0.7 10*3/uL (ref 0.1–1.0)
Monocytes Relative: 8 %
Neutro Abs: 5.7 10*3/uL (ref 1.7–7.7)
Neutrophils Relative %: 61 %
Platelets: 262 10*3/uL (ref 150–400)
RBC: 5.78 MIL/uL (ref 4.22–5.81)
RDW: 15.6 % — ABNORMAL HIGH (ref 11.5–15.5)
WBC: 9.2 10*3/uL (ref 4.0–10.5)
nRBC: 0 % (ref 0.0–0.2)

## 2021-10-07 LAB — BASIC METABOLIC PANEL
Anion gap: 12 (ref 5–15)
BUN: 10 mg/dL (ref 8–23)
CO2: 20 mmol/L — ABNORMAL LOW (ref 22–32)
Calcium: 9.3 mg/dL (ref 8.9–10.3)
Chloride: 105 mmol/L (ref 98–111)
Creatinine, Ser: 0.88 mg/dL (ref 0.61–1.24)
GFR, Estimated: >60 mL/min (ref >60)
Glucose, Bld: 93 mg/dL (ref 70–99)
Potassium: 4 mmol/L (ref 3.5–5.1)
Sodium: 137 mmol/L (ref 135–145)

## 2021-10-07 NOTE — ED Triage Notes (Signed)
Pt to ED via POV from [facility] with co angio edema. His upper lip is swollen, does not have any problems with with speaking. He did take Benadryl this am and wife feels that it has gone down a little. Pt does take Lisinopril.

## 2021-10-07 NOTE — ED Provider Notes (Signed)
Emergency Medicine Provider Triage Evaluation Note  Shaun Patrick. , a 65 y.o. male  was evaluated in triage.  Pt complains of upper lip swelling since last night. He is on lisinopril. Wife states he may have had a little relief with Benadryl. Patient denies feeling of airway swelling or difficulty swallowing.  Review of Systems  Positive: Upper lip swelling Negative: Shortness of breath, difficulty swallowing  Physical Exam  BP (!) 160/85 (BP Location: Right Arm)   Pulse 87   Temp 98.7 F (37.1 C) (Oral)   Resp 20   Ht 6' (1.829 m)   Wt (!) 137.9 kg   SpO2 98%   BMI 41.23 kg/m  Gen:   Awake, no distress   Resp:  Normal effort  MSK:   Moves extremities without difficulty  Other:    Medical Decision Making  Medically screening exam initiated at 3:38 PM.  Appropriate orders placed.  [name] was informed that the remainder of the evaluation will be completed by another provider, this initial triage assessment does not replace that evaluation, and the importance of remaining in the ED until their evaluation is complete.   Chinita Pester, FNP 10/07/21 1540    Willy Eddy, MD 10/07/21 1907

## 2021-10-08 ENCOUNTER — Telehealth: Payer: Self-pay

## 2021-10-08 ENCOUNTER — Emergency Department
Admission: EM | Admit: 2021-10-08 | Discharge: 2021-10-08 | Disposition: A | Discharge status: Home / Self Care | Payer: BLUE CROSS/BLUE SHIELD | Attending: Student in an Organized Health Care Education/Training Program | Admitting: Student in an Organized Health Care Education/Training Program

## 2021-10-08 NOTE — ED Notes (Signed)
Pt to front desk with spouse to inform staff he was leaving and sts, "this is ridiculous, Shaun Patrick been here this long and still waiting." Writer attempted to express empathy as well as explanation but pt turned, tossed blanket into empty wheelchair and walked out of WR with steady gait.

## 2021-10-08 NOTE — Telephone Encounter (Signed)
Patient's wife called asking if patient could be seen today for rash on arms, legs and swollen lips. Provider suggested patient be seen at either urgent care or ED-Toni

## 2021-10-09 ENCOUNTER — Other Ambulatory Visit: Payer: Self-pay

## 2021-10-09 ENCOUNTER — Encounter: Payer: Self-pay | Admitting: Nurse Practitioner

## 2021-10-09 ENCOUNTER — Ambulatory Visit: Payer: BLUE CROSS/BLUE SHIELD | Admitting: Nurse Practitioner

## 2021-10-09 VITALS — BP 139/83 | HR 90 | Temp 98.4°F | Resp 16 | Ht 72.0 in | Wt 303.6 lb

## 2021-10-09 DIAGNOSIS — Z87898 Personal history of other specified conditions: Secondary | ICD-10-CM

## 2021-10-09 DIAGNOSIS — I10 Essential (primary) hypertension: Secondary | ICD-10-CM | POA: Diagnosis not present

## 2021-10-09 DIAGNOSIS — Z888 Allergy status to other drugs, medicaments and biological substances status: Secondary | ICD-10-CM | POA: Diagnosis not present

## 2021-10-09 MED ORDER — AMLODIPINE BESYLATE 10 MG PO TABS: 10.0000 mg | ORAL_TABLET | Freq: Every day | ORAL | 1 refills | Status: DC

## 2021-10-09 NOTE — Progress Notes (Signed)
[facility] [address] [place], [place] [address]  Internal MEDICINE  Office Visit Note  Patient Name: Shaun Patrick  [id number]  [id number]  Date of Service: 10/09/2021  Chief Complaint  Patient presents with   Acute Visit    Allergic reaction, swollen lip     HPI Shaun Patrick presents for an acute sick visit for possible allergic reaction to his blood pressure medication . He went to walk in clinic for a swollen upper lip. He was sent to hospital, had labs drawn, waiting for 14 hours in ER in the waiting room and then left without being seen. This reaction was most likely angioedema due to the benazepril portion of his combination medication which is what the walk-in clinic provider told the patient as well.    Current Medication:  Outpatient Encounter Medications as of 10/09/2021  Medication Sig Note   amLODipine (NORVASC) 10 MG tablet Take 1 tablet (10 mg total) by mouth daily.    aspirin 81 MG chewable tablet CHEW 1 TABLET DAILY    montelukast (SINGULAIR) 10 MG tablet Take 1 tablet (10 mg total) by mouth daily.    Multiple Vitamin (MULTI-VITAMINS) TABS Take 1 tablet by mouth 1 day or 1 dose. 05/16/2015: Received from: [facility] Received Sig: Take by mouth.   pantoprazole (PROTONIX) 40 MG tablet Take 1 tablet (40 mg total) by mouth daily.    rosuvastatin (CRESTOR) 20 MG tablet Take 1 tablet (20 mg total) by mouth daily.    sildenafil (VIAGRA) 100 MG tablet Take 0.5-1 tablets (50-100 mg total) by mouth daily as needed for erectile dysfunction.    [DISCONTINUED] amLODipine-benazepril (LOTREL) 10-40 MG capsule Take 1 capsule by mouth daily. (Patient not taking: Reported on 10/09/2021)    [DISCONTINUED] doxycycline (VIBRA-TABS) 100 MG tablet Take 1 tablet (100 mg total) by mouth 2 (two) times daily. (Patient not taking: Reported on 10/09/2021)    No facility-administered encounter medications on file as of 10/09/2021.      Medical History: Past  Medical History:  Diagnosis Date   GERD (gastroesophageal reflux disease)    Hypertension      Vital Signs: BP 139/83   Pulse 90   Temp 98.4 F (36.9 C)   Resp 16   Ht 6' (1.829 m)   Wt (!) 303 lb 9.6 oz (137.7 kg)   SpO2 98%   BMI 41.18 kg/m    Review of Systems  Constitutional:  Negative for chills, fatigue and unexpected weight change.  HENT:  Negative for congestion, rhinorrhea, sneezing and sore throat.   Eyes:  Negative for redness.  Respiratory:  Negative for cough, chest tightness and shortness of breath.   Cardiovascular:  Negative for chest pain and palpitations.  Gastrointestinal:  Negative for abdominal pain, constipation, diarrhea, nausea and vomiting.  Genitourinary:  Negative for dysuria and frequency.  Musculoskeletal:  Negative for arthralgias, back pain, joint swelling and neck pain.  Skin:  Negative for rash.  Neurological: Negative.  Negative for tremors and numbness.  Hematological:  Negative for adenopathy. Does not bruise/bleed easily.  Psychiatric/Behavioral:  Negative for behavioral problems (Depression), sleep disturbance and suicidal ideas. The patient is not nervous/anxious.    Physical Exam Constitutional:      General: He is not in acute distress.    Appearance: Normal appearance. He is obese. He is not ill-appearing.  HENT:     Head: Normocephalic and atraumatic.  Eyes:     Extraocular Movements: Extraocular movements intact.  Pupils: Pupils are equal, round, and reactive to light.  Cardiovascular:     Rate and Rhythm: Normal rate and regular rhythm.  Pulmonary:     Effort: Pulmonary effort is normal. No respiratory distress.  Neurological:     Mental Status: He is alert and oriented to person, place, and time.     Cranial Nerves: No cranial nerve deficit.     Coordination: Coordination normal.     Gait: Gait normal.  Psychiatric:        Mood and Affect: Mood normal.        Behavior: Behavior normal.       Assessment/Plan: 1. Essential hypertension Discontinue amlodipine-benazepril, start amlodipine 10 mg daily.  - amLODipine (NORVASC) 10 MG tablet; Take 1 tablet (10 mg total) by mouth daily.  Dispense: 90 tablet; Refill: 1  2. History of angioedema Recent angioedema-type reaction to combination medication amlodipine-benazepril.   3. Allergy to ACE inhibitors ACE inhibitors listed as allergy due to likely angioedema type reaction to combination drug.    General Counseling: caedon bond understanding of the findings of todays visit and agrees with plan of treatment. I have discussed any further diagnostic evaluation that may be needed or ordered today. We also reviewed his medications today. he has been encouraged to call the office with any questions or concerns that should arise related to todays visit.    Counseling:    No orders of the defined types were placed in this encounter.   Meds ordered this encounter  Medications   amLODipine (NORVASC) 10 MG tablet    Sig: Take 1 tablet (10 mg total) by mouth daily.    Dispense:  90 tablet    Refill:  1    Return in about 1 month (around 11/08/2021) for F/U, BP check with Lauren.  [place] Controlled Substance Database was reviewed by me for overdose risk score (ORS)  Time spent:20 Minutes Time spent with patient included reviewing progress notes, labs, imaging studies, and discussing plan for follow up.   This patient was seen by Sallyanne Kuster, FNP-C in collaboration with Dr. Beverely Risen as a part of collaborative care agreement.  Makinley Muscato R. Tedd Sias, MSN, FNP-C Internal Medicine

## 2021-10-15 ENCOUNTER — Ambulatory Visit (INDEPENDENT_AMBULATORY_CARE_PROVIDER_SITE_OTHER): Payer: BLUE CROSS/BLUE SHIELD | Admitting: Nurse Practitioner

## 2021-10-15 ENCOUNTER — Encounter: Payer: Self-pay | Admitting: Nurse Practitioner

## 2021-10-15 ENCOUNTER — Other Ambulatory Visit: Payer: Self-pay

## 2021-10-15 VITALS — BP 151/88 | HR 83 | Temp 98.4°F | Resp 16 | Ht 72.0 in | Wt 307.0 lb

## 2021-10-15 DIAGNOSIS — L270 Generalized skin eruption due to drugs and medicaments taken internally: Secondary | ICD-10-CM | POA: Diagnosis not present

## 2021-10-15 DIAGNOSIS — I10 Essential (primary) hypertension: Secondary | ICD-10-CM | POA: Diagnosis not present

## 2021-10-15 DIAGNOSIS — L299 Pruritus, unspecified: Secondary | ICD-10-CM | POA: Diagnosis not present

## 2021-10-15 MED ORDER — METOPROLOL SUCCINATE ER 25 MG PO TB24: 25.0000 mg | ORAL_TABLET | Freq: Every day | ORAL | 1 refills | Status: DC

## 2021-10-15 MED ORDER — TRIAMCINOLONE ACETONIDE 0.1 % EX CREA: 1.0000 "application " | TOPICAL_CREAM | Freq: Three times a day (TID) | CUTANEOUS | 2 refills | Status: DC

## 2021-10-15 NOTE — Progress Notes (Signed)
[facility] [address] [place], [place] [address]  Internal MEDICINE  Office Visit Note  Patient Name: Shaun Patrick  [id number]  [id number]  Date of Service: 10/15/2021  Chief Complaint  Patient presents with   Acute Visit   Rash    Little red bumps, itchy    HPI Luthor presents for a follow up visit for a generalized itchy rash. He reports that it started 1-2 days after starting the new medication amlodipine so he stopped taking the medication. His blood pressure remains elevated since he has not been taking any medication for hypertension. He has tried topical hydrocortisone for the itchy rash and benadryl at night to help him sleep which has helped some but the rash still persists.     Current Medication: Outpatient Encounter Medications as of 10/15/2021  Medication Sig Note   aspirin 81 MG chewable tablet CHEW 1 TABLET DAILY    metoprolol succinate (TOPROL-XL) 25 MG 24 hr tablet Take 1 tablet (25 mg total) by mouth daily.    montelukast (SINGULAIR) 10 MG tablet Take 1 tablet (10 mg total) by mouth daily.    Multiple Vitamin (MULTI-VITAMINS) TABS Take 1 tablet by mouth 1 day or 1 dose. 05/16/2015: Received from: [facility] Received Sig: Take by mouth.   pantoprazole (PROTONIX) 40 MG tablet Take 1 tablet (40 mg total) by mouth daily.    rosuvastatin (CRESTOR) 20 MG tablet Take 1 tablet (20 mg total) by mouth daily.    sildenafil (VIAGRA) 100 MG tablet Take 0.5-1 tablets (50-100 mg total) by mouth daily as needed for erectile dysfunction.    triamcinolone cream (KENALOG) 0.1 % Apply 1 application topically 3 (three) times daily. To affected areas until resolved.    [DISCONTINUED] amLODipine (NORVASC) 10 MG tablet Take 1 tablet (10 mg total) by mouth daily.    No facility-administered encounter medications on file as of 10/15/2021.    Surgical History: Past Surgical History:  Procedure Laterality Date   HERNIA REPAIR     umbilical   JOINT  REPLACEMENT Bilateral    knees   KNEE SURGERY Bilateral    knee replacements   NASAL SEPTOPLASTY W/ TURBINOPLASTY Bilateral 10/17/2015   Procedure: NASAL SEPTOPLASTY WITH TURBINATE REDUCTION;  Surgeon: Linus Salmons, MD;  Location: [facility] ORS;  Service: ENT;  Laterality: Bilateral;   NECK SURGERY  1979   broke neck   TONSILECTOMY, ADENOIDECTOMY, BILATERAL MYRINGOTOMY AND TUBES     TONSILLECTOMY      Medical History: Past Medical History:  Diagnosis Date   GERD (gastroesophageal reflux disease)    Hypertension     Family History: Family History  Problem Relation Age of Onset   Stroke Mother    Heart disease Mother    Hypertension Mother    Liver disease Father    Hypertension Sister     Social History   Socioeconomic History   Marital status: Married    Spouse name: Not on file   Number of children: Not on file   Years of education: Not on file   Highest education level: Not on file  Occupational History   Occupation: med lab techinician     Employer: [employer]  Tobacco Use   Smoking status: Former    Years: 10.00    Types: Cigarettes    Start date: 12/03/1983    Quit date: 12/02/1993    Years since quitting: 27.8   Smokeless tobacco: Never  Substance and Sexual Activity   Alcohol  use: Yes    Alcohol/week: 4.0 standard drinks    Types: 4 Cans of beer per week   Drug use: No   Sexual activity: Never  Other Topics Concern   Not on file  Social History Narrative   Not on file   Social Determinants of Health   Financial Resource Strain: Not on file  Food Insecurity: Not on file  Transportation Needs: Not on file  Physical Activity: Not on file  Stress: Not on file  Social Connections: Not on file  Intimate Partner Violence: Not on file      Review of Systems  Constitutional:  Negative for chills, fatigue and unexpected weight change.  HENT:  Negative for congestion, rhinorrhea, sneezing and sore throat.   Eyes:  Negative for  redness.  Respiratory:  Negative for cough, chest tightness and shortness of breath.   Cardiovascular:  Negative for chest pain and palpitations.  Gastrointestinal:  Negative for abdominal pain, constipation, diarrhea, nausea and vomiting.  Genitourinary:  Negative for dysuria and frequency.  Musculoskeletal:  Negative for arthralgias, back pain, joint swelling and neck pain.  Skin:  Positive for rash (itchy red bumps all over body).  Neurological: Negative.  Negative for tremors and numbness.  Hematological:  Negative for adenopathy. Does not bruise/bleed easily.  Psychiatric/Behavioral:  Negative for behavioral problems (Depression), sleep disturbance and suicidal ideas. The patient is not nervous/anxious.    Vital Signs: BP (!) 151/88   Pulse 83   Temp 98.4 F (36.9 C)   Resp 16   Ht 6' (1.829 m)   Wt (!) 307 lb (139.3 kg)   SpO2 99%   BMI 41.64 kg/m    Physical Exam Constitutional:      Appearance: Normal appearance.  HENT:     Head: Normocephalic and atraumatic.  Eyes:     Pupils: Pupils are equal, round, and reactive to light.  Cardiovascular:     Rate and Rhythm: Normal rate and regular rhythm.  Pulmonary:     Effort: Pulmonary effort is normal. No respiratory distress.  Skin:    Findings: Rash (red itchy bumps generalized torso, and extremities) present.  Neurological:     Mental Status: He is alert and oriented to person, place, and time.     Cranial Nerves: No cranial nerve deficit.     Gait: Gait normal.       Assessment/Plan: 1. Essential hypertension Patient has allergy to ACE inhibitors and amlodipine. ARBs are contraindicated due to porbably angioedema-like reaction to lisinopril. Start small dose of metoprolol daily and take with food. Follow up in 1 month.  - metoprolol succinate (TOPROL-XL) 25 MG 24 hr tablet; Take 1 tablet (25 mg total) by mouth daily.  Dispense: 30 tablet; Refill: 1  2. Drug-induced skin rash May apply up to 3 times daily to  rash to decrease redness, inflammation and itching. May use until rash is resolved.  - triamcinolone cream (KENALOG) 0.1 %; Apply 1 application topically 3 (three) times daily. To affected areas until resolved.  Dispense: 45 g; Refill: 2  3. Generalized pruritus May use triamcinolone or OTC hydrocortisone to decrease itching, may take benadryl to alleviate itching at night.  - triamcinolone cream (KENALOG) 0.1 %; Apply 1 application topically 3 (three) times daily. To affected areas until resolved.  Dispense: 45 g; Refill: 2    General Counseling: Yoni verbalizes understanding of the findings of todays visit and agrees with plan of treatment. I have discussed any further diagnostic evaluation that may be  needed or ordered today. We also reviewed his medications today. he has been encouraged to call the office with any questions or concerns that should arise related to todays visit.    No orders of the defined types were placed in this encounter.   Meds ordered this encounter  Medications   triamcinolone cream (KENALOG) 0.1 %    Sig: Apply 1 application topically 3 (three) times daily. To affected areas until resolved.    Dispense:  45 g    Refill:  2   metoprolol succinate (TOPROL-XL) 25 MG 24 hr tablet    Sig: Take 1 tablet (25 mg total) by mouth daily.    Dispense:  30 tablet    Refill:  1    Return in about 1 month (around 11/14/2021) for F/U, BP check, Denicia Pagliarulo PCP.   Total time spent:30 Minutes Time spent includes review of chart, medications, test results, and follow up plan with the patient.   [place] Controlled Substance Database was reviewed by me.  This patient was seen by Sallyanne Kuster, FNP-C in collaboration with Dr. Beverely Risen as a part of collaborative care agreement.   Albana Saperstein R. Tedd Sias, MSN, FNP-C Internal medicine

## 2021-10-23 ENCOUNTER — Other Ambulatory Visit: Payer: Self-pay | Admitting: Internal Medicine

## 2021-10-23 DIAGNOSIS — J302 Other seasonal allergic rhinitis: Secondary | ICD-10-CM

## 2021-10-24 ENCOUNTER — Other Ambulatory Visit: Payer: Self-pay | Admitting: Nurse Practitioner

## 2021-10-24 ENCOUNTER — Telehealth: Payer: Self-pay

## 2021-10-24 DIAGNOSIS — L299 Pruritus, unspecified: Secondary | ICD-10-CM

## 2021-10-24 DIAGNOSIS — L509 Urticaria, unspecified: Secondary | ICD-10-CM

## 2021-10-24 MED ORDER — CLOBETASOL PROPIONATE 0.05 % EX OINT: 1.0000 "application " | TOPICAL_OINTMENT | Freq: Two times a day (BID) | CUTANEOUS | 0 refills | Status: AC

## 2021-10-24 NOTE — Telephone Encounter (Signed)
Pt's wife called c/o pt still having itching and rash (Hives) has whelps on his back, legs, stomach, and it goes away during the day but still may have itching some, and when he wakes up in the morning he is whelped up.  Pt's wife advised she has changed detergents to where it is free of all dyes and so forth.  He takes benadryl to help.  I spoke to [facility] and she feels it isn't coming from his Bp meds and she wants pt to have allergy testing next week (gave call to Sheralyn Boatman to schedule), and she placed urgent referral to Dermatology and also she will send in a stronger topical medication to his pharmacy.

## 2021-10-31 ENCOUNTER — Ambulatory Visit: Payer: Self-pay | Admitting: Dermatology

## 2021-10-31 ENCOUNTER — Other Ambulatory Visit: Payer: Self-pay

## 2021-11-01 ENCOUNTER — Telehealth: Payer: Self-pay

## 2021-11-01 ENCOUNTER — Ambulatory Visit: Payer: Medicare Other | Admitting: Dermatology

## 2021-11-01 ENCOUNTER — Ambulatory Visit: Payer: Medicare Other | Admitting: Internal Medicine

## 2021-11-01 DIAGNOSIS — T465X5A Adverse effect of other antihypertensive drugs, initial encounter: Secondary | ICD-10-CM | POA: Diagnosis not present

## 2021-11-01 DIAGNOSIS — T783XXA Angioneurotic edema, initial encounter: Secondary | ICD-10-CM | POA: Diagnosis not present

## 2021-11-01 DIAGNOSIS — L509 Urticaria, unspecified: Secondary | ICD-10-CM

## 2021-11-01 DIAGNOSIS — L500 Allergic urticaria: Secondary | ICD-10-CM

## 2021-11-01 NOTE — Telephone Encounter (Signed)
Patient arrived today for allergy testing. Unable to do testing due to patient took benadryl for hives. Per Arlyss Repress, I called Dr. Onnie Boer office [phone] to see if they could get him in for appointment sooner than 11/06/21. Left them vm to return my call asap-Toni

## 2021-11-01 NOTE — Patient Instructions (Addendum)
Start allegra (Over the counter) start 1 pill daily. If still hives after 2- 3 days increase to 2 pills by mouth daily, if still hive after 2 - 3 days increase to 3 pills daily. Can take up to 4 pills daily if hive are not improving.    Call if you are not doing well.    If You Need Anything After Your Visit  If you have any questions or concerns for your doctor, please call our main line at [phone] and press option 4 to reach your doctor's medical assistant. If no one answers, please leave a voicemail as directed and we will return your call as soon as possible. Messages left after 4 pm will be answered the following business day.   You may also send [place] a message via MyChart. We typically respond to MyChart messages within 1-2 business days.  For prescription refills, please ask your pharmacy to contact our office. Our fax number is [phone].  If you have an urgent issue when the clinic is closed that cannot wait until the next business day, you can page your doctor at the number below.    Please note that while we do our best to be available for urgent issues outside of office hours, we are not available 24/7.   If you have an urgent issue and are unable to reach [place], you may choose to seek medical care at your doctor's office, retail clinic, urgent care center, or emergency room.  If you have a medical emergency, please immediately call 911 or go to the emergency department.  Pager Numbers  - Dr. Gwen Pounds: [phone]  - Dr. Neale Burly: [phone]  - Dr. Roseanne Reno: [phone]  In the event of inclement weather, please call our main line at [phone] for an update on the status of any delays or closures.  Dermatology Medication Tips: Please keep the boxes that topical medications come in in order to help keep track of the instructions about where and how to use these. Pharmacies typically print the medication instructions only on the boxes and not directly on the medication  tubes.   If your medication is too expensive, please contact our office at [phone] option 4 or send [place] a message through MyChart.   We are unable to tell what your co-pay for medications will be in advance as this is different depending on your insurance coverage. However, we may be able to find a substitute medication at lower cost or fill out paperwork to get insurance to cover a needed medication.   If a prior authorization is required to get your medication covered by your insurance company, please allow [place] 1-2 business days to complete this process.  Drug prices often vary depending on where the prescription is filled and some pharmacies may offer cheaper prices.  The website www.goodrx.com contains coupons for medications through different pharmacies. The prices here do not account for what the cost may be with help from insurance (it may be cheaper with your insurance), but the website can give you the price if you did not use any insurance.  - You can print the associated coupon and take it with your prescription to the pharmacy.  - You may also stop by our office during regular business hours and pick up a GoodRx coupon card.  - If you need your prescription sent electronically to a different pharmacy, notify our office through [facility] or by phone at [phone] option 4.     Si Usted [employer]  Algo Despus de Su Visita  Tambin puede enviarnos un mensaje a travs de MyChart. Por lo general respondemos a los mensajes de MyChart en el transcurso de 1 a 2 das hbiles.  Para renovar recetas, por favor pida a su farmacia que se ponga en contacto con nuestra oficina. Annie Sable de fax es [place] [phone].  Si tiene un asunto urgente cuando la clnica est cerrada y que no puede esperar hasta el siguiente da hbil, puede llamar/localizar a su doctor(a) al nmero que aparece a continuacin.   Por favor, tenga en cuenta que aunque hacemos todo lo posible para estar  disponibles para asuntos urgentes fuera del horario de [place], no estamos disponibles las 24 horas del da, los 7 [address]  [address] de la [place].   Si tiene un problema urgente y no puede comunicarse con nosotros, puede optar por buscar atencin mdica  en el consultorio de su doctor(a), en una clnica privada, en un centro de atencin urgente o en una sala de emergencias.  Si tiene Engineer, drilling, por favor llame inmediatamente al 911 o vaya a la sala de emergencias.  Nmeros de bper  - Dr. Gwen Pounds: [phone]  - Dra. Moye: [phone]  - Dra. Roseanne Reno: [phone]  En caso de inclemencias del [place], por favor llame a Lacy Duverney principal al [phone] para una actualizacin sobre el [place] de cualquier retraso o cierre.  Consejos para la medicacin en dermatologa: Por favor, guarde las cajas en las que vienen los medicamentos de uso tpico para ayudarle a seguir las instrucciones sobre dnde y cmo usarlos. Las farmacias generalmente imprimen las instrucciones del medicamento slo en las cajas y no directamente en los tubos del [place].   Si su medicamento es muy caro, por favor, pngase en contacto con Rolm Gala llamando al [phone] y presione la opcin 4 o envenos un mensaje a travs de Clinical cytogeneticist.   No podemos decirle cul ser su copago por los medicamentos por adelantado ya que esto es diferente dependiendo de la cobertura de su seguro. Sin embargo, es posible que podamos encontrar un medicamento sustituto a Audiological scientist un formulario para que el seguro cubra el medicamento que se considera necesario.   Si se requiere una autorizacin previa para que su compaa de seguros [place] su medicamento, por favor permtanos de 1 a 2 das hbiles para completar [address]  Los precios de los medicamentos varan con frecuencia dependiendo del Environmental consultant de dnde se surte la receta y alguna farmacias pueden ofrecer precios ms baratos.  El sitio web www.goodrx.com tiene  cupones para medicamentos de Health and safety inspector. Los precios aqu no tienen en cuenta lo que podra costar con la ayuda del seguro (puede ser ms barato con su seguro), pero el sitio web puede darle el precio si no utiliz Tourist information centre manager.  - Puede imprimir el cupn correspondiente y llevarlo con su receta a la farmacia.  - Tambin puede pasar por nuestra oficina durante el horario de atencin regular y Education officer, museum una tarjeta de cupones de GoodRx.  - Si necesita que su receta se enve electrnicamente a una farmacia diferente, informe a nuestra oficina a travs de MyChart de [place] o por telfono llamando al [phone] y presione la opcin 4.

## 2021-11-01 NOTE — Progress Notes (Signed)
   New Patient Visit  Subjective  Shaun Patrick. is a 65 y.o. male who presents for the following: New Patient (Initial Visit) (New patient here concerning a rash he developed around the beginning of November. Patient reports started with swollen lips and then itchy bumps formed at chest, abdomen, axillas, back, thighs. Patient reports that rash does itch. Patient's pcp prescribed some triamcinolone and has been using benadryl. ). Patient started out with swollen lip and developed a rash at trunk and extremities.   The following portions of the chart were reviewed this encounter and updated as appropriate:   Tobacco  Allergies  Meds  Problems  Med Hx  Surg Hx  Fam Hx     Review of Systems:  No other skin or systemic complaints except as noted in HPI or Assessment and Plan.  Objective  Well appearing patient in no apparent distress; mood and affect are within normal limits.  A focused examination was performed including face, back, arms , chest, upper thighs. Relevant physical exam findings are noted in the Assessment and Plan.  arms, chest, back, face, thighs Urticarial plaques on arms, chest, legs, lips      Assessment & Plan  Urticaria with Angioedema of lips  - 2ndary to drug reaction - most likely ACE inhibitor Benzapril Severe, but improving  arms, chest, back, face, thighs  Review patient's most recent labs - all normal Allergic drug reaction to amlodipine with benzapril (already discontinued) Patient has as allergy to Ace inhibitor  Pamphlet on hives given to patient  Start allegra (Over the counter) start 1 pill daily. If still hives after 2- 3 days increase to 2 pills by mouth daily, if still hives after 2 - 3 days increase to 3 pills daily.  Can take up to 4 pills daily if hives are not improving.   If necessary, may need to use systemic steroids. Discussed xolair as possible treatment option.  Return for 6 week follow up to urticaria .  IAsher Muir,  CMA, am acting as scribe for Armida Sans, MD. Documentation: I have reviewed the above documentation for accuracy and completeness, and I agree with the above.  Armida Sans, MD

## 2021-11-06 ENCOUNTER — Ambulatory Visit: Payer: Self-pay | Admitting: Dermatology

## 2021-11-06 ENCOUNTER — Other Ambulatory Visit: Payer: Self-pay | Admitting: Nurse Practitioner

## 2021-11-06 DIAGNOSIS — I10 Essential (primary) hypertension: Secondary | ICD-10-CM

## 2021-11-09 ENCOUNTER — Encounter: Payer: Self-pay | Admitting: Dermatology

## 2021-11-15 ENCOUNTER — Ambulatory Visit (INDEPENDENT_AMBULATORY_CARE_PROVIDER_SITE_OTHER): Payer: Medicare Other | Admitting: Physician Assistant

## 2021-11-15 ENCOUNTER — Other Ambulatory Visit: Payer: Self-pay

## 2021-11-15 VITALS — BP 150/90 | HR 72 | Temp 98.0°F | Resp 16 | Ht 72.0 in | Wt 306.0 lb

## 2021-11-15 DIAGNOSIS — I10 Essential (primary) hypertension: Secondary | ICD-10-CM

## 2021-11-15 NOTE — Progress Notes (Signed)
[facility] [address] [place], [place] [address]  Internal MEDICINE  Office Visit Note  Patient Name: Mamadou Breon  [id number]  [id number]  Date of Service: 11/21/2021  Chief Complaint  Patient presents with   Hypertension    4 month f/u    HPI Pt is here for routine follow up -He was started on BP meds 2 visits prior and had an allergic reaction likely to ACE and was switched to amlodipine and still had allergic reaction though less severe. He was then started on metoprolol and is tolerating it well. His BP is still on the higher side though he did drive in the rain to appt today and states it is usually higher in office. He has not been checking at home and will start now before changing meds further given multiple recent reactions. -He has recovered from recent reactions and is doing well today -Sleeping well. -he is looking forward to his upcoming birthday and the holidays  Current Medication: Outpatient Encounter Medications as of 11/15/2021  Medication Sig Note   aspirin 81 MG chewable tablet CHEW 1 TABLET DAILY    clobetasol ointment (TEMOVATE) 0.05 % Apply 1 application topically 2 (two) times daily.    metoprolol succinate (TOPROL-XL) 25 MG 24 hr tablet TAKE 1 TABLET (25 MG TOTAL) BY MOUTH DAILY.    montelukast (SINGULAIR) 10 MG tablet TAKE 1 TABLET BY MOUTH EVERY DAY    Multiple Vitamin (MULTI-VITAMINS) TABS Take 1 tablet by mouth 1 day or 1 dose. 05/16/2015: Received from: [facility] Received Sig: Take by mouth.   pantoprazole (PROTONIX) 40 MG tablet Take 1 tablet (40 mg total) by mouth daily.    rosuvastatin (CRESTOR) 20 MG tablet Take 1 tablet (20 mg total) by mouth daily.    sildenafil (VIAGRA) 100 MG tablet Take 0.5-1 tablets (50-100 mg total) by mouth daily as needed for erectile dysfunction.    triamcinolone cream (KENALOG) 0.1 % Apply 1 application topically 3 (three) times daily. To affected areas until resolved.    No  facility-administered encounter medications on file as of 11/15/2021.    Surgical History: Past Surgical History:  Procedure Laterality Date   HERNIA REPAIR     umbilical   JOINT REPLACEMENT Bilateral    knees   KNEE SURGERY Bilateral    knee replacements   NASAL SEPTOPLASTY W/ TURBINOPLASTY Bilateral 10/17/2015   Procedure: NASAL SEPTOPLASTY WITH TURBINATE REDUCTION;  Surgeon: Linus Salmons, MD;  Location: [facility] ORS;  Service: ENT;  Laterality: Bilateral;   NECK SURGERY  1979   broke neck   TONSILECTOMY, ADENOIDECTOMY, BILATERAL MYRINGOTOMY AND TUBES     TONSILLECTOMY      Medical History: Past Medical History:  Diagnosis Date   GERD (gastroesophageal reflux disease)    Hypertension     Family History: Family History  Problem Relation Age of Onset   Stroke Mother    Heart disease Mother    Hypertension Mother    Liver disease Father    Hypertension Sister     Social History   Socioeconomic History   Marital status: Married    Spouse name: Not on file   Number of children: Not on file   Years of education: Not on file   Highest education level: Not on file  Occupational History   Occupation: med lab techinician     Employer: [employer]  Tobacco Use   Smoking status: Former    Years: 10.00    Types:  Cigarettes    Start date: 12/03/1983    Quit date: 12/02/1993    Years since quitting: 27.9   Smokeless tobacco: Never  Substance and Sexual Activity   Alcohol use: Yes    Alcohol/week: 4.0 standard drinks    Types: 4 Cans of beer per week   Drug use: No   Sexual activity: Never  Other Topics Concern   Not on file  Social History Narrative   Not on file   Social Determinants of Health   Financial Resource Strain: Not on file  Food Insecurity: Not on file  Transportation Needs: Not on file  Physical Activity: Not on file  Stress: Not on file  Social Connections: Not on file  Intimate Partner Violence: Not on file      Review of  Systems  Constitutional:  Negative for chills, fatigue and unexpected weight change.  HENT:  Negative for congestion, rhinorrhea, sneezing and sore throat.   Eyes:  Negative for redness.  Respiratory:  Negative for cough, chest tightness and shortness of breath.   Cardiovascular:  Negative for chest pain and palpitations.  Gastrointestinal:  Negative for abdominal pain, constipation, diarrhea, nausea and vomiting.  Genitourinary:  Negative for dysuria and frequency.  Musculoskeletal:  Negative for arthralgias, back pain, joint swelling and neck pain.  Skin:  Negative for rash.  Neurological: Negative.  Negative for tremors and numbness.  Hematological:  Negative for adenopathy. Does not bruise/bleed easily.  Psychiatric/Behavioral:  Negative for behavioral problems (Depression), sleep disturbance and suicidal ideas. The patient is not nervous/anxious.    Vital Signs: BP (!) 150/90 Comment: 164/91   Pulse 72    Temp 98 F (36.7 C)    Resp 16    Ht 6' (1.829 m)    Wt (!) 306 lb (138.8 kg)    SpO2 99%    BMI 41.50 kg/m    Physical Exam Vitals and nursing note reviewed.  Constitutional:      General: He is not in acute distress.    Appearance: He is well-developed. He is obese. He is not diaphoretic.  HENT:     Head: Normocephalic and atraumatic.     Mouth/Throat:     Pharynx: No oropharyngeal exudate.  Eyes:     Pupils: Pupils are equal, round, and reactive to light.  Neck:     Thyroid: No thyromegaly.     Vascular: No JVD.     Trachea: No tracheal deviation.  Cardiovascular:     Rate and Rhythm: Normal rate and regular rhythm.     Heart sounds: Normal heart sounds. No murmur heard.   No friction rub. No gallop.  Pulmonary:     Effort: Pulmonary effort is normal. No respiratory distress.     Breath sounds: No wheezing or rales.  Chest:     Chest wall: No tenderness.  Abdominal:     General: Bowel sounds are normal.     Palpations: Abdomen is soft.  Musculoskeletal:         General: Normal range of motion.     Cervical back: Normal range of motion and neck supple.  Lymphadenopathy:     Cervical: No cervical adenopathy.  Skin:    General: Skin is warm and dry.  Neurological:     Mental Status: He is alert and oriented to person, place, and time.     Cranial Nerves: No cranial nerve deficit.  Psychiatric:        Behavior: Behavior normal.  Thought Content: Thought content normal.        Judgment: Judgment normal.       Assessment/Plan: 1. Essential hypertension Will continue current medications and have him start logging BP at home. He has had several allergic reactions to BP meds recently and will avoid starting new med today as he has recently recovered and feels that his BP is elevated due to bad weather and traffic today. If not improving will need to consider additional medication.   General Counseling: jhoan schmieder understanding of the findings of todays visit and agrees with plan of treatment. I have discussed any further diagnostic evaluation that may be needed or ordered today. We also reviewed his medications today. he has been encouraged to call the office with any questions or concerns that should arise related to todays visit.    No orders of the defined types were placed in this encounter.   No orders of the defined types were placed in this encounter.   This patient was seen by Lynn Ito, PA-C in collaboration with Dr. Beverely Risen as a part of collaborative care agreement.   Total time spent:30 Minutes Time spent includes review of chart, medications, test results, and follow up plan with the patient.      Dr Lyndon Code Internal medicine

## 2021-11-19 DIAGNOSIS — M79675 Pain in left toe(s): Secondary | ICD-10-CM | POA: Diagnosis not present

## 2021-11-19 DIAGNOSIS — M79674 Pain in right toe(s): Secondary | ICD-10-CM | POA: Diagnosis not present

## 2021-11-19 DIAGNOSIS — B351 Tinea unguium: Secondary | ICD-10-CM | POA: Diagnosis not present

## 2021-11-19 DIAGNOSIS — L600 Ingrowing nail: Secondary | ICD-10-CM | POA: Diagnosis not present

## 2021-11-30 ENCOUNTER — Other Ambulatory Visit: Payer: Self-pay | Admitting: Physician Assistant

## 2021-11-30 MED ORDER — ZOSTER VAC RECOMB ADJUVANTED 50 MCG/0.5ML IM SUSR: 0.5000 mL | Freq: Once | INTRAMUSCULAR | 0 refills | Status: AC

## 2021-12-13 ENCOUNTER — Ambulatory Visit: Payer: Medicare Other | Admitting: Dermatology

## 2021-12-13 ENCOUNTER — Other Ambulatory Visit: Payer: Self-pay

## 2021-12-13 DIAGNOSIS — L500 Allergic urticaria: Secondary | ICD-10-CM

## 2021-12-13 DIAGNOSIS — T783XXD Angioneurotic edema, subsequent encounter: Secondary | ICD-10-CM

## 2021-12-13 DIAGNOSIS — L509 Urticaria, unspecified: Secondary | ICD-10-CM

## 2021-12-13 DIAGNOSIS — T465X5D Adverse effect of other antihypertensive drugs, subsequent encounter: Secondary | ICD-10-CM | POA: Diagnosis not present

## 2021-12-13 NOTE — Patient Instructions (Addendum)
If You Need Anything After Your Visit ° °If you have any questions or concerns for your doctor, please call our main line at [phone] and press option 4 to reach your doctor's medical assistant. If no one answers, please leave a voicemail as directed and we will return your call as soon as possible. Messages left after 4 pm will be answered the following business day.  ° °You may also send us a message via MyChart. We typically respond to MyChart messages within 1-2 business days. ° °For prescription refills, please ask your pharmacy to contact our office. Our fax number is [phone]. ° °If you have an urgent issue when the clinic is closed that cannot wait until the next business day, you can page your doctor at the number below.   ° °Please note that while we do our best to be available for urgent issues outside of office hours, we are not available 24/7.  ° °If you have an urgent issue and are unable to reach us, you may choose to seek medical care at your doctor's office, retail clinic, urgent care center, or emergency room. ° °If you have a medical emergency, please immediately call 911 or go to the emergency department. ° °Pager Numbers ° °- Dr. Kowalski: [phone] ° °- Dr. Moye: [phone] ° °- Dr. Stewart: [phone] ° °In the event of inclement weather, please call our main line at [phone] for an update on the status of any delays or closures. ° °Dermatology Medication Tips: °Please keep the boxes that topical medications come in in order to help keep track of the instructions about where and how to use these. Pharmacies typically print the medication instructions only on the boxes and not directly on the medication tubes.  ° °If your medication is too expensive, please contact our office at [phone] option 4 or send us a message through MyChart.  ° °We are unable to tell what your co-pay for medications will be in advance as this is different depending on your insurance coverage.  However, we may be able to find a substitute medication at lower cost or fill out paperwork to get insurance to cover a needed medication.  ° °If a prior authorization is required to get your medication covered by your insurance company, please allow us 1-2 business days to complete this process. ° °Drug prices often vary depending on where the prescription is filled and some pharmacies may offer cheaper prices. ° °The website www.goodrx.com contains coupons for medications through different pharmacies. The prices here do not account for what the cost may be with help from insurance (it may be cheaper with your insurance), but the website can give you the price if you did not use any insurance.  °- You can print the associated coupon and take it with your prescription to the pharmacy.  °- You may also stop by our office during regular business hours and pick up a GoodRx coupon card.  °- If you need your prescription sent electronically to a different pharmacy, notify our office through Shaun Patrick MyChart or by phone at [phone] option 4. ° ° ° ° °Si Usted Necesita Algo Después de Su Visita ° °También puede enviarnos un mensaje a través de MyChart. Por lo general respondemos a los mensajes de MyChart en el transcurso de 1 a 2 días hábiles. ° °Para renovar recetas, por favor pida a su farmacia que se ponga en contacto con nuestra oficina. Nuestro número de fax es el [phone]. ° °Si tiene   un asunto urgente cuando la clnica est cerrada y que no puede esperar hasta el siguiente da hbil, puede llamar/localizar a su doctor(a) al nmero que aparece a continuacin.   Por favor, tenga en cuenta que aunque hacemos todo lo posible para estar disponibles para asuntos urgentes fuera del horario de [place], no estamos disponibles las 24 horas del da, los 7 [address]  [address] de la [place].   Si tiene un problema urgente y no puede comunicarse con nosotros, puede optar por buscar atencin mdica  en el consultorio de su  doctor(a), en una clnica privada, en un centro de atencin urgente o en una sala de emergencias.  Si tiene Engineer, drilling, por favor llame inmediatamente al 911 o vaya a la sala de emergencias.  Nmeros de bper  - Dr. Gwen Pounds: [phone]  - Dra. Moye: [phone]  - Dra. Roseanne Reno: [phone]  En caso de inclemencias del [place], por favor llame a Shaun Patrick principal al [phone] para una actualizacin sobre el [place] de cualquier retraso o cierre.  Consejos para la medicacin en dermatologa: Por favor, guarde las cajas en las que vienen los medicamentos de uso tpico para ayudarle a seguir las instrucciones sobre dnde y cmo usarlos. Las farmacias generalmente imprimen las instrucciones del medicamento slo en las cajas y no directamente en los tubos del [place].   Si su medicamento es muy caro, por favor, pngase en contacto con Rolm Gala llamando al [phone] y presione la opcin 4 o envenos un mensaje a travs de Clinical cytogeneticist.   No podemos decirle cul ser su copago por los medicamentos por adelantado ya que esto es diferente dependiendo de la cobertura de su seguro. Sin embargo, es posible que podamos encontrar un medicamento sustituto a Audiological scientist un formulario para que el seguro cubra el medicamento que se considera necesario.   Si se requiere una autorizacin previa para que su compaa de seguros [place] su medicamento, por favor permtanos de 1 a 2 das hbiles para completar [address]  Los precios de los medicamentos varan con frecuencia dependiendo del Environmental consultant de dnde se surte la receta y alguna farmacias pueden ofrecer precios ms baratos.  El sitio web www.goodrx.com tiene cupones para medicamentos de Health and safety inspector. Los precios aqu no tienen en cuenta lo que podra costar con la ayuda del seguro (puede ser ms barato con su seguro), pero el sitio web puede darle el precio si no utiliz Tourist information centre manager.  - Puede imprimir el cupn  correspondiente y llevarlo con su receta a la farmacia.  - Tambin puede pasar por nuestra oficina durante el horario de atencin regular y Education officer, museum una tarjeta de cupones de GoodRx.  - Si necesita que su receta se enve electrnicamente a una farmacia diferente, informe a nuestra oficina a travs de MyChart de Shaun Patrick o por telfono llamando al [phone] y presione la opcin 4.   Increase to Allegra 2 pill in the morning for at least 2-4 more weeks Start Benadryl 25mg  1 pill at bedtime

## 2021-12-13 NOTE — Progress Notes (Signed)
° °  Follow-Up Visit   Subjective  Shaun Patrick. is a 66 y.o. male who presents for the following: Urticaria with angioedema of lips 2ndary to drug rxn most l (Arms, chest, back, face, thighs, 6 wk f/u, Allegra 1-2 po qam, better during the day, but wife says he worsens at night, has not had any more swelling of lips).  Patient accompanied by wife who contributes to history.  The patient has spots, moles and lesions to be evaluated, some may be new or changing and the patient has concerns that these could be cancer.  The following portions of the chart were reviewed this encounter and updated as appropriate:   Tobacco   Allergies   Meds   Problems   Med Hx   Surg Hx   Fam Hx      Review of Systems:  No other skin or systemic complaints except as noted in HPI or Assessment and Plan.  Objective  Well appearing patient in no apparent distress; mood and affect are within normal limits.  A focused examination was performed including trunk, arms. Relevant physical exam findings are noted in the Assessment and Plan.  trunk, arms, legs Clear today   Assessment & Plan  Urticaria -persistent with periodic flares especially at night Chronic urticaria trunk, arms, legs With hx of Angioedema of lips -2ndary to drug reaction - most likely ACE inhibitor Benzapril, which has been discontinued Improving but persistent.  Advised patient he may experience problems for several more weeks but it will gradually dissipate.  Increase to Allegra 2 po qam for at least 2-4 wks Start Benadryl 25mg  1 po qhs   Urticaria or hives is a pink to red patchy whelp- like rash of the skin that typically itches and it is the result of histamine release in the skin.   Hives may have multiple causes including stress, medications, infections, and systemic illness.  Sometimes there is a family history of chronic urticaria.   "Physical urticarias" may be caused by heat, sun, cold, vibration.   Insect bites can cause  "papular urticaria". It is often difficult to find the cause of generalized hives.  Statistically, 70% of the time a cause of generalized hives is not found.  Sometimes hives can spontaneously resolve. Other times hives can persist and when it does, and no cause is found, and it has been at least 6 weeks since started, it is called "chronic idiopathic urticaria".   Return in about 6 weeks (around 01/24/2022) for Urticaria f/u.  I, 01/26/2022, RMA, am acting as scribe for Ardis Rowan, MD . Documentation: I have reviewed the above documentation for accuracy and completeness, and I agree with the above.  Armida Sans, MD

## 2021-12-17 ENCOUNTER — Encounter: Payer: Self-pay | Admitting: Dermatology

## 2021-12-27 ENCOUNTER — Other Ambulatory Visit: Payer: Self-pay

## 2021-12-27 ENCOUNTER — Ambulatory Visit (INDEPENDENT_AMBULATORY_CARE_PROVIDER_SITE_OTHER): Payer: Medicare Other | Admitting: Physician Assistant

## 2021-12-27 ENCOUNTER — Encounter: Payer: Self-pay | Admitting: Physician Assistant

## 2021-12-27 VITALS — BP 138/90 | HR 82 | Temp 98.3°F | Resp 16 | Ht 72.0 in | Wt 308.8 lb

## 2021-12-27 DIAGNOSIS — Z6841 Body Mass Index (BMI) 40.0 and over, adult: Secondary | ICD-10-CM

## 2021-12-27 DIAGNOSIS — E785 Hyperlipidemia, unspecified: Secondary | ICD-10-CM | POA: Diagnosis not present

## 2021-12-27 DIAGNOSIS — I10 Essential (primary) hypertension: Secondary | ICD-10-CM

## 2021-12-27 DIAGNOSIS — K219 Gastro-esophageal reflux disease without esophagitis: Secondary | ICD-10-CM

## 2021-12-27 NOTE — Patient Instructions (Signed)
Obesity, Adult °Obesity is having too much body fat. Being obese means that your weight is more than what is healthy for you.  °BMI (body mass index) is a number that explains how much body fat you have. If you have a BMI of 30 or more, you are obese. °Obesity can cause serious health problems, such as: °Stroke. °Coronary artery disease (CAD). °Type 2 diabetes. °Some types of cancer. °High blood pressure (hypertension). °High cholesterol. °Gallbladder stones. °Obesity can also contribute to: °Osteoarthritis. °Sleep apnea. °Infertility problems. °What are the causes? °Eating meals each day that are high in calories, sugar, and fat. °Drinking a lot of drinks that have sugar in them. °Being born with genes that may make you more likely to become obese. °Having a medical condition that causes obesity. °Taking certain medicines. °Sitting a lot (having a sedentary lifestyle). °Not getting enough sleep. °What increases the risk? °Having a family history of obesity. °Living in an area with limited access to: °Parks, recreation centers, or sidewalks. °Healthy food choices, such as grocery stores and farmers' markets. °What are the signs or symptoms? °The main sign is having too much body fat. °How is this treated? °Treatment for this condition often includes changing your lifestyle. Treatment may include: °Changing your diet. This may include making a healthy meal plan. °Exercise. This may include activity that causes your heart to beat faster (aerobic exercise) and strength training. Work with your doctor to design a program that works for you. °Medicine to help you lose weight. This may be used if you are not able to lose one pound a week after 6 weeks of healthy eating and more exercise. °Treating conditions that cause the obesity. °Surgery. Options may include gastric banding and gastric bypass. This may be done if: °Other treatments have not helped to improve your condition. °You have a BMI of 40 or higher. °You have  life-threatening health problems related to obesity. °Follow these instructions at home: °Eating and drinking ° °Follow advice from your doctor about what to eat and drink. Your doctor may tell you to: °Limit fast food, sweets, and processed snack foods. °Choose low-fat options. For example, choose low-fat milk instead of whole milk. °Eat five or more servings of fruits or vegetables each day. °Eat at home more often. This gives you more control over what you eat. °Choose healthy foods when you eat out. °Learn to read food labels. This will help you learn how much food is in one serving. °Keep low-fat snacks available. °Avoid drinks that have a lot of sugar in them. These include soda, fruit juice, iced tea with sugar, and flavored milk. °Drink enough water to keep your pee (urine) pale yellow. °Do not go on fad diets. °Physical activity °Exercise often, as told by your doctor. Most adults should get up to 150 minutes of moderate-intensity exercise every week.Ask your doctor: °What types of exercise are safe for you. °How often you should exercise. °Warm up and stretch before being active. °Do slow stretching after being active (cool down). °Rest between times of being active. °Lifestyle °Work with your doctor and a food expert (dietitian) to set a weight-loss goal that is best for you. °Limit your screen time. °Find ways to reward yourself that do not involve food. °Do not drink alcohol if: °Your doctor tells you not to drink. °You are pregnant, may be pregnant, or are planning to become pregnant. °If you drink alcohol: °Limit how much you have to: °0-1 drink a day for women. °0-2 drinks   a day for men. °Know how much alcohol is in your drink. In the [place] one drink equals one 12 oz bottle of beer (355 mL), one 5 oz glass of wine (148 mL), or one 1½ oz glass of hard liquor (44 mL). °General instructions °Keep a weight-loss journal. This can help you keep track of: °The food that you eat. °How much exercise you  get. °Take over-the-counter and prescription medicines only as told by your doctor. °Take vitamins and supplements only as told by your doctor. °Think about joining a support group. °Pay attention to your mental health as obesity can lead to depression or self esteem issues. °Keep all follow-up visits. °Contact a doctor if: °You cannot meet your weight-loss goal after you have changed your diet and lifestyle for 6 weeks. °You are having trouble breathing. °Summary °Obesity is having too much body fat. °Being obese means that your weight is more than what is healthy for you. °Work with your doctor to set a weight-loss goal. °Get regular exercise as told by your doctor. °This information is not intended to replace advice given to you by your health care provider. Make sure you discuss any questions you have with your health care provider. °Document Revised: 06/26/2021 Document Reviewed: 06/26/2021 °Elsevier Patient Education © 2022 [employer]. ° °

## 2021-12-27 NOTE — Progress Notes (Signed)
[facility] [address] [place], [place] [address]  Internal MEDICINE  Office Visit Note  Patient Name: Shaun Patrick  [id number]  [id number]  Date of Service: 12/27/2021  Chief Complaint  Patient presents with   Follow-up   Hypertension   Gastroesophageal Reflux    HPI Pt is here for routine follow up and BP recheck -Saw dermatology and states rash has resolved, but will still probably have a follow up -Enjoyed his birthday and holidays -Does not recall home BP readings and admits he was not checking often, but will do so now -BP greatly improved in office today -Advised to work on improving diet and increasing activity level -Will need to order updated labs next visit  Current Medication: Outpatient Encounter Medications as of 12/27/2021  Medication Sig Note   aspirin 81 MG chewable tablet CHEW 1 TABLET DAILY    metoprolol succinate (TOPROL-XL) 25 MG 24 hr tablet TAKE 1 TABLET (25 MG TOTAL) BY MOUTH DAILY.    montelukast (SINGULAIR) 10 MG tablet TAKE 1 TABLET BY MOUTH EVERY DAY    Multiple Vitamin (MULTI-VITAMINS) TABS Take 1 tablet by mouth 1 day or 1 dose. 05/16/2015: Received from: [facility] Received Sig: Take by mouth.   pantoprazole (PROTONIX) 40 MG tablet Take 1 tablet (40 mg total) by mouth daily.    rosuvastatin (CRESTOR) 20 MG tablet Take 1 tablet (20 mg total) by mouth daily.    sildenafil (VIAGRA) 100 MG tablet Take 0.5-1 tablets (50-100 mg total) by mouth daily as needed for erectile dysfunction.    clobetasol ointment (TEMOVATE) 0.05 % Apply 1 application topically 2 (two) times daily. (Patient not taking: Reported on 12/27/2021)    triamcinolone cream (KENALOG) 0.1 % Apply 1 application topically 3 (three) times daily. To affected areas until resolved. (Patient not taking: Reported on 12/27/2021)    No facility-administered encounter medications on file as of 12/27/2021.    Surgical History: Past Surgical History:  Procedure  Laterality Date   HERNIA REPAIR     umbilical   JOINT REPLACEMENT Bilateral    knees   KNEE SURGERY Bilateral    knee replacements   NASAL SEPTOPLASTY W/ TURBINOPLASTY Bilateral 10/17/2015   Procedure: NASAL SEPTOPLASTY WITH TURBINATE REDUCTION;  Surgeon: Linus Salmons, MD;  Location: [facility] ORS;  Service: ENT;  Laterality: Bilateral;   NECK SURGERY  1979   broke neck   TONSILECTOMY, ADENOIDECTOMY, BILATERAL MYRINGOTOMY AND TUBES     TONSILLECTOMY      Medical History: Past Medical History:  Diagnosis Date   GERD (gastroesophageal reflux disease)    Hypertension     Family History: Family History  Problem Relation Age of Onset   Stroke Mother    Heart disease Mother    Hypertension Mother    Liver disease Father    Hypertension Sister     Social History   Socioeconomic History   Marital status: Married    Spouse name: Not on file   Number of children: Not on file   Years of education: Not on file   Highest education level: Not on file  Occupational History   Occupation: med lab techinician     Employer: [employer]  Tobacco Use   Smoking status: Former    Years: 10.00    Types: Cigarettes    Start date: 12/03/1983    Quit date: 12/02/1993    Years since quitting: 28.0   Smokeless tobacco: Never  Substance and Sexual Activity  Alcohol use: Yes    Alcohol/week: 4.0 standard drinks    Types: 4 Cans of beer per week   Drug use: No   Sexual activity: Never  Other Topics Concern   Not on file  Social History Narrative   Not on file   Social Determinants of Health   Financial Resource Strain: Not on file  Food Insecurity: Not on file  Transportation Needs: Not on file  Physical Activity: Not on file  Stress: Not on file  Social Connections: Not on file  Intimate Partner Violence: Not on file      Review of Systems  Constitutional:  Negative for chills, fatigue and unexpected weight change.  HENT:  Negative for congestion,  rhinorrhea, sneezing and sore throat.   Eyes:  Negative for redness.  Respiratory:  Negative for cough, chest tightness and shortness of breath.   Cardiovascular:  Negative for chest pain and palpitations.  Gastrointestinal:  Negative for abdominal pain, constipation, diarrhea, nausea and vomiting.  Genitourinary:  Negative for dysuria and frequency.  Musculoskeletal:  Negative for arthralgias, back pain, joint swelling and neck pain.  Skin:  Negative for rash.  Neurological: Negative.  Negative for tremors and numbness.  Hematological:  Negative for adenopathy. Does not bruise/bleed easily.  Psychiatric/Behavioral:  Negative for behavioral problems (Depression), sleep disturbance and suicidal ideas. The patient is not nervous/anxious.    Vital Signs: BP 138/90 Comment: 145/88   Pulse 82    Temp 98.3 F (36.8 C)    Resp 16    Ht 6' (1.829 m)    Wt (!) 308 lb 12.8 oz (140.1 kg)    SpO2 98%    BMI 41.88 kg/m    Physical Exam Vitals and nursing note reviewed.  Constitutional:      General: He is not in acute distress.    Appearance: He is well-developed. He is obese. He is not diaphoretic.  HENT:     Head: Normocephalic and atraumatic.     Mouth/Throat:     Pharynx: No oropharyngeal exudate.  Eyes:     Pupils: Pupils are equal, round, and reactive to light.  Neck:     Thyroid: No thyromegaly.     Vascular: No JVD.     Trachea: No tracheal deviation.  Cardiovascular:     Rate and Rhythm: Normal rate and regular rhythm.     Heart sounds: Normal heart sounds. No murmur heard.   No friction rub. No gallop.  Pulmonary:     Effort: Pulmonary effort is normal. No respiratory distress.     Breath sounds: No wheezing or rales.  Chest:     Chest wall: No tenderness.  Abdominal:     General: Bowel sounds are normal.     Palpations: Abdomen is soft.  Musculoskeletal:        General: Normal range of motion.     Cervical back: Normal range of motion and neck supple.  Lymphadenopathy:      Cervical: No cervical adenopathy.  Skin:    General: Skin is warm and dry.  Neurological:     Mental Status: He is alert and oriented to person, place, and time.     Cranial Nerves: No cranial nerve deficit.  Psychiatric:        Behavior: Behavior normal.        Thought Content: Thought content normal.        Judgment: Judgment normal.       Assessment/Plan: 1. Essential hypertension Well controlled today, will  continue Metoprolol and have pt monitor at home--call if rising  2. Dyslipidemia Continue crestor, will need updated labs ordered next visit  3. Gastroesophageal reflux disease without esophagitis Continue protonix  4. Morbid obesity with body mass index (BMI) of 40.0 to 44.9 in adult [facility](HCC) Obesity Counseling: Had a lengthy discussion regarding patients BMI and weight issues. Patient was instructed on portion control as well as increased activity. Also discussed caloric restrictions with trying to maintain intake less than 2000 Kcal. Discussions were made in accordance with the 5As of weight management. Simple actions such as not eating late and if able to, taking a walk is suggested.    General Counseling: Glori LuisLuther verbalizes understanding of the findings of todays visit and agrees with plan of treatment. I have discussed any further diagnostic evaluation that may be needed or ordered today. We also reviewed his medications today. he has been encouraged to call the office with any questions or concerns that should arise related to todays visit.    No orders of the defined types were placed in this encounter.   No orders of the defined types were placed in this encounter.   This patient was seen by Lynn ItoLauren Shemaiah Round, PA-C in collaboration with Dr. Beverely RisenFozia Khan as a part of collaborative care agreement.   Total time spent:30 Minutes Time spent includes review of chart, medications, test results, and follow up plan with the patient.      Dr Lyndon CodeFozia M Khan Internal  medicine

## 2022-01-14 ENCOUNTER — Ambulatory Visit: Payer: BLUE CROSS/BLUE SHIELD | Admitting: Dermatology

## 2022-01-19 ENCOUNTER — Other Ambulatory Visit: Payer: Self-pay | Admitting: Physician Assistant

## 2022-01-19 DIAGNOSIS — E785 Hyperlipidemia, unspecified: Secondary | ICD-10-CM

## 2022-01-24 ENCOUNTER — Ambulatory Visit: Payer: BLUE CROSS/BLUE SHIELD | Admitting: Dermatology

## 2022-02-09 ENCOUNTER — Other Ambulatory Visit: Payer: Self-pay | Admitting: Nurse Practitioner

## 2022-02-09 DIAGNOSIS — I10 Essential (primary) hypertension: Secondary | ICD-10-CM

## 2022-03-13 ENCOUNTER — Ambulatory Visit: Payer: BLUE CROSS/BLUE SHIELD | Admitting: Dermatology

## 2022-03-22 ENCOUNTER — Encounter: Payer: Self-pay | Admitting: Physician Assistant

## 2022-03-22 ENCOUNTER — Ambulatory Visit (INDEPENDENT_AMBULATORY_CARE_PROVIDER_SITE_OTHER): Payer: Medicare Other | Admitting: Physician Assistant

## 2022-03-22 DIAGNOSIS — K219 Gastro-esophageal reflux disease without esophagitis: Secondary | ICD-10-CM

## 2022-03-22 DIAGNOSIS — R7989 Other specified abnormal findings of blood chemistry: Secondary | ICD-10-CM

## 2022-03-22 DIAGNOSIS — Z6841 Body Mass Index (BMI) 40.0 and over, adult: Secondary | ICD-10-CM

## 2022-03-22 DIAGNOSIS — R5383 Other fatigue: Secondary | ICD-10-CM | POA: Diagnosis not present

## 2022-03-22 DIAGNOSIS — I10 Essential (primary) hypertension: Secondary | ICD-10-CM

## 2022-03-22 DIAGNOSIS — E782 Mixed hyperlipidemia: Secondary | ICD-10-CM | POA: Diagnosis not present

## 2022-03-22 DIAGNOSIS — Z125 Encounter for screening for malignant neoplasm of prostate: Secondary | ICD-10-CM

## 2022-03-22 MED ORDER — HYDRALAZINE HCL 10 MG PO TABS: 10.0000 mg | ORAL_TABLET | Freq: Two times a day (BID) | ORAL | 2 refills | Status: DC

## 2022-03-22 MED ORDER — PANTOPRAZOLE SODIUM 40 MG PO TBEC: 40.0000 mg | DELAYED_RELEASE_TABLET | Freq: Every day | ORAL | 1 refills | Status: DC

## 2022-03-22 NOTE — Progress Notes (Signed)
[facility] ?[address] ?[place], [place] [address] ? ?Internal MEDICINE  ?Office Visit Note ? ?Patient Name: Shaun Patrick. ? [id number]  ?[id number] ? ?Date of Service: 03/29/2022 ? ?Chief Complaint  ?Patient presents with  ? Hypertension  ?  3 month f/u  ? ? ?HPI ?Pt is here for routine follow up with his wife ?-Has not been checking BP at home, but reports BP always higher in office ?-recheck only improved slightly to 150/88 ?-Unfortunately he has recently had allergic reactions to several BP meds including amlodipine and ACEI which limit options. Will add hydralazine starting with once per day but then may take BID if BP high on home monitoring ?-He continues to have some urticaria and has follow up with derm in May ?-also discussed need to work on weight which has been rising and will work on cutting back on beers and improving diet ? ?Current Medication: ?Outpatient Encounter Medications as of 03/22/2022  ?Medication Sig Note  ? aspirin 81 MG chewable tablet CHEW 1 TABLET DAILY   ? clobetasol ointment (TEMOVATE) [id number] % Apply 1 application topically 2 (two) times daily.   ? hydrALAZINE (APRESOLINE) 10 MG tablet Take 1 tablet (10 mg total) by mouth 2 (two) times daily.   ? metoprolol succinate (TOPROL-XL) 25 MG 24 hr tablet TAKE 1 TABLET (25 MG TOTAL) BY MOUTH DAILY.   ? montelukast (SINGULAIR) 10 MG tablet TAKE 1 TABLET BY MOUTH EVERY DAY   ? Multiple Vitamin (MULTI-VITAMINS) TABS Take 1 tablet by mouth 1 day or 1 dose. 05/16/2015: Received from: [facility]: Take by mouth.  ? rosuvastatin (CRESTOR) 20 MG tablet TAKE 1 TABLET BY MOUTH EVERY DAY   ? sildenafil (VIAGRA) 100 MG tablet Take 0.5-1 tablets (50-100 mg total) by mouth daily as needed for erectile dysfunction.   ? triamcinolone cream (KENALOG) 0.1 % Apply 1 application topically 3 (three) times daily. To affected areas until resolved.   ? [DISCONTINUED] pantoprazole (PROTONIX) 40 MG tablet Take 1 tablet (40 mg  total) by mouth daily.   ? pantoprazole (PROTONIX) 40 MG tablet Take 1 tablet (40 mg total) by mouth daily.   ? ?No facility-administered encounter medications on file as of 03/22/2022.  ? ? ?Surgical History: ?Past Surgical History:  ?Procedure Laterality Date  ? HERNIA REPAIR    ? umbilical  ? JOINT REPLACEMENT Bilateral   ? knees  ? KNEE SURGERY Bilateral   ? knee replacements  ? NASAL SEPTOPLASTY W/ TURBINOPLASTY Bilateral 10/17/2015  ? Procedure: NASAL SEPTOPLASTY WITH TURBINATE REDUCTION;  Surgeon: Beverly Gust, MD;  Location: [facility] ORS;  Service: ENT;  Laterality: Bilateral;  ? NECK SURGERY  1979  ? broke neck  ? TONSILECTOMY, ADENOIDECTOMY, BILATERAL MYRINGOTOMY AND TUBES    ? TONSILLECTOMY    ? ? ?Medical History: ?Past Medical History:  ?Diagnosis Date  ? GERD (gastroesophageal reflux disease)   ? Hypertension   ? ? ?Family History: ?Family History  ?Problem Relation Age of Onset  ? Stroke Mother   ? Heart disease Mother   ? Hypertension Mother   ? Liver disease Father   ? Hypertension Sister   ? ? ?Social History  ? ?Socioeconomic History  ? Marital status: Married  ?  Spouse name: Not on file  ? Number of children: Not on file  ? Years of education: Not on file  ? Highest education level: Not on file  ?Occupational History  ? Occupation: med lab techinician   ?  Employer: [employer]  ?Tobacco Use  ? Smoking status: Former  ?  Years: 10.00  ?  Types: Cigarettes  ?  Start date: 12/03/1983  ?  Quit date: 12/02/1993  ?  Years since quitting: 28.3  ? Smokeless tobacco: Never  ?Substance and Sexual Activity  ? Alcohol use: Yes  ?  Alcohol/week: 4.0 standard drinks  ?  Types: 4 Cans of beer per week  ? Drug use: No  ? Sexual activity: Never  ?Other Topics Concern  ? Not on file  ?Social History Narrative  ? Not on file  ? ?Social Determinants of Health  ? ?Financial Resource Strain: Not on file  ?Food Insecurity: Not on file  ?Transportation Needs: Not on file  ?Physical Activity: Not on file   ?Stress: Not on file  ?Social Connections: Not on file  ?Intimate Partner Violence: Not on file  ? ? ? ? ?Review of Systems  ?Constitutional:  Negative for chills, fatigue and unexpected weight change.  ?HENT:  Negative for congestion, rhinorrhea, sneezing and sore throat.   ?Eyes:  Negative for redness.  ?Respiratory:  Negative for cough, chest tightness and shortness of breath.   ?Cardiovascular:  Negative for chest pain and palpitations.  ?Gastrointestinal:  Negative for abdominal pain, constipation, diarrhea, nausea and vomiting.  ?Genitourinary:  Negative for dysuria and frequency.  ?Musculoskeletal:  Negative for arthralgias, back pain, joint swelling and neck pain.  ?Skin:  Positive for rash.  ?Neurological: Negative.  Negative for tremors and numbness.  ?Hematological:  Negative for adenopathy. Does not bruise/bleed easily.  ?Psychiatric/Behavioral:  Negative for behavioral problems (Depression), sleep disturbance and suicidal ideas. The patient is not nervous/anxious.   ? ?Vital Signs: ?BP (!) 153/94   Pulse 83   Temp 98.4 ?F (36.9 ?C)   Resp 16   Ht 6\' 1"  (1.854 m)   Wt (!) 313 lb 9.6 oz (142.2 kg)   SpO2 96%   BMI 41.37 kg/m?  ? ? ?Physical Exam ?Vitals and nursing note reviewed.  ?Constitutional:   ?   General: He is not in acute distress. ?   Appearance: He is well-developed. He is obese. He is not diaphoretic.  ?HENT:  ?   Head: Normocephalic and atraumatic.  ?   Mouth/Throat:  ?   Pharynx: No oropharyngeal exudate.  ?Eyes:  ?   Pupils: Pupils are equal, round, and reactive to light.  ?Neck:  ?   Thyroid: No thyromegaly.  ?   Vascular: No JVD.  ?   Trachea: No tracheal deviation.  ?Cardiovascular:  ?   Rate and Rhythm: Normal rate and regular rhythm.  ?   Heart sounds: Normal heart sounds. No murmur heard. ?  No friction rub. No gallop.  ?Pulmonary:  ?   Effort: Pulmonary effort is normal. No respiratory distress.  ?   Breath sounds: No wheezing or rales.  ?Chest:  ?   Chest wall: No  tenderness.  ?Abdominal:  ?   General: Bowel sounds are normal.  ?   Palpations: Abdomen is soft.  ?Musculoskeletal:     ?   General: Normal range of motion.  ?   Cervical back: Normal range of motion and neck supple.  ?Lymphadenopathy:  ?   Cervical: No cervical adenopathy.  ?Skin: ?   General: Skin is warm and dry.  ?Neurological:  ?   Mental Status: He is alert and oriented to person, place, and time.  ?   Cranial Nerves: No cranial nerve deficit.  ?  Psychiatric:     ?   Behavior: Behavior normal.     ?   Thought Content: Thought content normal.     ?   Judgment: Judgment normal.  ? ? ? ? ? ?Assessment/Plan: ?1. Essential hypertension ?Continue metoprolol as before and will add hydralazine starting with once a day but may increase to twice daily pending BP response.  Patient will start monitoring blood pressure daily ?- hydrALAZINE (APRESOLINE) 10 MG tablet; Take 1 tablet (10 mg total) by mouth 2 (two) times daily.  Dispense: 30 tablet; Refill: 2 ? ?2. Gastroesophageal reflux disease without esophagitis ?- pantoprazole (PROTONIX) 40 MG tablet; Take 1 tablet (40 mg total) by mouth daily.  Dispense: 90 tablet; Refill: 1 ? ?3. Mixed hyperlipidemia ?- Lipid Panel With LDL/HDL Ratio ? ?4. Abnormal thyroid blood test ?- TSH + free T4 ? ?5. Special screening for malignant neoplasm of prostate ?- PSA Total (Reflex To Free) ? ?6. Other fatigue ?- CBC w/Diff/Platelet ?- Comprehensive metabolic panel ?- TSH + free T4 ?- Lipid Panel With LDL/HDL Ratio ? ?7. Morbid obesity with body mass index (BMI) of 40.0 to 44.9 in adult [facility]) ?Discussed working on weight loss and cutting back on beer intake as this may be contributing to his weight gain since retiring.  Also work on [employer] and exercise ? ? ?General Counseling: jordy foltyn understanding of the findings of todays visit and agrees with plan of treatment. I have discussed any further diagnostic evaluation that may be needed or ordered today. We also reviewed his  medications today. he has been encouraged to call the office with any questions or concerns that should arise related to todays visit. ? ? ? ?Orders Placed This Encounter  ?Procedures  ? CBC w/Diff/Platelet  ? Comprehensiv

## 2022-03-29 ENCOUNTER — Other Ambulatory Visit: Payer: Self-pay | Admitting: Physician Assistant

## 2022-03-29 DIAGNOSIS — I10 Essential (primary) hypertension: Secondary | ICD-10-CM

## 2022-04-03 ENCOUNTER — Ambulatory Visit: Payer: Medicare Other | Admitting: Dermatology

## 2022-04-03 DIAGNOSIS — L509 Urticaria, unspecified: Secondary | ICD-10-CM

## 2022-04-03 DIAGNOSIS — L500 Allergic urticaria: Secondary | ICD-10-CM | POA: Diagnosis not present

## 2022-04-03 NOTE — Progress Notes (Signed)
? ?  Follow-Up Visit ?  ?Subjective  ?Shaun Patrick. is a 66 y.o. male who presents for the following: Urticaria (Trunk, arms legs, 6wk f/u, Allegra 2 po qd, benadryl qhs, no new outbreaks). ? ?Patient accompanied by wife who contributes to history. ? ?The following portions of the chart were reviewed this encounter and updated as appropriate:  ? Tobacco  Allergies  Meds  Problems  Med Hx  Surg Hx  Fam Hx   ?  ?Review of Systems:  No other skin or systemic complaints except as noted in HPI or Assessment and Plan. ? ?Objective  ?Well appearing patient in no apparent distress; mood and affect are within normal limits. ? ?A focused examination was performed including face, arms. Relevant physical exam findings are noted in the Assessment and Plan. ? ?trunk, extremities ?Pt clear today ? ? ?Assessment & Plan  ?Urticaria ?trunk, extremities ?With hx of Angioedema of lips ?-2ndary to drug reaction - most likely ACE inhibitor Benzapril, which has been discontinued ?Improving.   ? ?Urticaria or hives is a pink to red patchy whelp- like rash of the skin that typically itches and it is the result of histamine release in the skin.   ?Hives may have multiple causes including stress, medications, infections, and systemic illness.  Sometimes there is a family history of chronic urticaria.   ?"Physical urticarias" may be caused by heat, sun, cold, vibration.   ?Insect bites can cause "papular urticaria". ?It is often difficult to find the cause of generalized hives.  Statistically, 70% of the time a cause of generalized hives is not found.  Sometimes hives can spontaneously resolve. ?Other times hives can persist and when it does, and no cause is found, and it has been at least 6 weeks since started, it is called "chronic idiopathic urticaria". ? ?Cont Allegra 2 po qam for 1 more month and try d/cing ?Cont Benadryl qhs for 1 more month and try d/cing ? ?Return if symptoms worsen or fail to improve. ? ?I, Othelia Pulling, RMA,  am acting as scribe for Sarina Ser, MD . ?Documentation: I have reviewed the above documentation for accuracy and completeness, and I agree with the above. ? ?Sarina Ser, MD ? ?

## 2022-04-03 NOTE — Patient Instructions (Signed)

## 2022-04-16 ENCOUNTER — Encounter: Payer: Self-pay | Admitting: Dermatology

## 2022-04-17 ENCOUNTER — Other Ambulatory Visit: Payer: Self-pay | Admitting: Nurse Practitioner

## 2022-04-17 DIAGNOSIS — J3089 Other allergic rhinitis: Secondary | ICD-10-CM

## 2022-04-19 ENCOUNTER — Ambulatory Visit (INDEPENDENT_AMBULATORY_CARE_PROVIDER_SITE_OTHER): Payer: Medicare Other | Admitting: Physician Assistant

## 2022-04-19 ENCOUNTER — Encounter: Payer: Self-pay | Admitting: Physician Assistant

## 2022-04-19 VITALS — BP 142/92 | HR 75 | Temp 97.8°F | Resp 16 | Ht 72.0 in | Wt 315.4 lb

## 2022-04-19 DIAGNOSIS — Z6841 Body Mass Index (BMI) 40.0 and over, adult: Secondary | ICD-10-CM

## 2022-04-19 DIAGNOSIS — R7989 Other specified abnormal findings of blood chemistry: Secondary | ICD-10-CM | POA: Diagnosis not present

## 2022-04-19 DIAGNOSIS — I10 Essential (primary) hypertension: Secondary | ICD-10-CM | POA: Diagnosis not present

## 2022-04-19 DIAGNOSIS — R5383 Other fatigue: Secondary | ICD-10-CM | POA: Diagnosis not present

## 2022-04-19 DIAGNOSIS — E782 Mixed hyperlipidemia: Secondary | ICD-10-CM | POA: Diagnosis not present

## 2022-04-19 NOTE — Progress Notes (Signed)
[facility] [address] [place], [place] [address]  Internal MEDICINE  Office Visit Note  Patient Name: Shaun AdamLuther Printz Jr.  [id number]  [id number]  Date of Service: 04/30/2022  Chief Complaint  Patient presents with   Follow-up   Gastroesophageal Reflux   Hypertension    HPI Pt is here for routine follow up with his wife -Has been taking metoprolol and hydralazine once per day instead of twice per day as prescribed. Unfortunately with his allergies we are limited in what meds can be used to control his BP. He will start taking hydralazine BID and will follow closely -Discussed the need for weight loss as he continues to gain weight and may contribute to rising BP. Discussed goal of a min 3lbs wt loss by next visit. He will start walking more again, reduce alcohol intake and improve diet. His wife is present and also states she has been encouraging him to lose weight as well. -due for fasting labs  Current Medication: Outpatient Encounter Medications as of 04/19/2022  Medication Sig Note   aspirin 81 MG chewable tablet CHEW 1 TABLET DAILY    clobetasol ointment (TEMOVATE) 0.05 % Apply 1 application topically 2 (two) times daily.    metoprolol succinate (TOPROL-XL) 25 MG 24 hr tablet TAKE 1 TABLET (25 MG TOTAL) BY MOUTH DAILY.    montelukast (SINGULAIR) 10 MG tablet TAKE 1 TABLET BY MOUTH EVERY DAY    Multiple Vitamin (MULTI-VITAMINS) TABS Take 1 tablet by mouth 1 day or 1 dose. 05/16/2015: [facility] Received Sig: Take by mouth.   pantoprazole (PROTONIX) 40 MG tablet Take 1 tablet (40 mg total) by mouth daily.    rosuvastatin (CRESTOR) 20 MG tablet TAKE 1 TABLET BY MOUTH EVERY DAY    sildenafil (VIAGRA) 100 MG tablet Take 0.5-1 tablets (50-100 mg total) by mouth daily as needed for erectile dysfunction.    triamcinolone cream (KENALOG) 0.1 % Apply 1 application topically 3 (three) times daily. To affected areas until resolved.     [DISCONTINUED] hydrALAZINE (APRESOLINE) 10 MG tablet Take 1 tablet (10 mg total) by mouth 2 (two) times daily.    No facility-administered encounter medications on file as of 04/19/2022.    Surgical History: Past Surgical History:  Procedure Laterality Date   HERNIA REPAIR     umbilical   JOINT REPLACEMENT Bilateral    knees   KNEE SURGERY Bilateral    knee replacements   NASAL SEPTOPLASTY W/ TURBINOPLASTY Bilateral 10/17/2015   Procedure: NASAL SEPTOPLASTY WITH TURBINATE REDUCTION;  Surgeon: Linus Salmonshapman McQueen, MD;  Location: [facility] ORS;  Service: ENT;  Laterality: Bilateral;   NECK SURGERY  1979   broke neck   TONSILECTOMY, ADENOIDECTOMY, BILATERAL MYRINGOTOMY AND TUBES     TONSILLECTOMY      Medical History: Past Medical History:  Diagnosis Date   GERD (gastroesophageal reflux disease)    Hypertension     Family History: Family History  Problem Relation Age of Onset   Stroke Mother    Heart disease Mother    Hypertension Mother    Liver disease Father    Hypertension Sister     Social History   Socioeconomic History   Marital status: Married    Spouse name: Not on file   Number of children: Not on file   Years of education: Not on file   Highest education level: Not on file  Occupational History   Occupation: med lab Nature conservation officertechinician     Employer: [employer]  Tobacco Use   Smoking status: Former    Years: 10.00    Types: Cigarettes    Start date: 12/03/1983    Quit date: 12/02/1993    Years since quitting: 28.4   Smokeless tobacco: Never  Substance and Sexual Activity   Alcohol use: Yes    Alcohol/week: 4.0 standard drinks    Types: 4 Cans of beer per week   Drug use: No   Sexual activity: Never  Other Topics Concern   Not on file  Social History Narrative   Not on file   Social Determinants of Health   Financial Resource Strain: Not on file  Food Insecurity: Not on file  Transportation Needs: Not on file  Physical Activity: Not on file   Stress: Not on file  Social Connections: Not on file  Intimate Partner Violence: Not on file      Review of Systems  Constitutional:  Negative for chills, fatigue and unexpected weight change.  HENT:  Negative for congestion, rhinorrhea, sneezing and sore throat.   Eyes:  Negative for redness.  Respiratory:  Negative for cough, chest tightness and shortness of breath.   Cardiovascular:  Negative for chest pain and palpitations.  Gastrointestinal:  Negative for abdominal pain, constipation, diarrhea, nausea and vomiting.  Genitourinary:  Negative for dysuria and frequency.  Musculoskeletal:  Negative for arthralgias, back pain, joint swelling and neck pain.  Skin:  Negative for rash.  Neurological: Negative.  Negative for tremors and numbness.  Hematological:  Negative for adenopathy. Does not bruise/bleed easily.  Psychiatric/Behavioral:  Negative for behavioral problems (Depression), sleep disturbance and suicidal ideas. The patient is not nervous/anxious.    Vital Signs: BP (!) 142/92 Comment: 157/106  Pulse 75   Temp 97.8 F (36.6 C)   Resp 16   Ht 6' (1.829 m)   Wt (!) 315 lb 6.4 oz (143.1 kg)   SpO2 99%   BMI 42.78 kg/m    Physical Exam Vitals and nursing note reviewed.  Constitutional:      General: He is not in acute distress.    Appearance: He is well-developed. He is obese. He is not diaphoretic.  HENT:     Head: Normocephalic and atraumatic.     Mouth/Throat:     Pharynx: No oropharyngeal exudate.  Eyes:     Pupils: Pupils are equal, round, and reactive to light.  Neck:     Thyroid: No thyromegaly.     Vascular: No JVD.     Trachea: No tracheal deviation.  Cardiovascular:     Rate and Rhythm: Normal rate and regular rhythm.     Heart sounds: Normal heart sounds. No murmur heard.   No friction rub. No gallop.  Pulmonary:     Effort: Pulmonary effort is normal. No respiratory distress.     Breath sounds: No wheezing or rales.  Chest:     Chest wall:  No tenderness.  Abdominal:     General: Bowel sounds are normal.     Palpations: Abdomen is soft.  Musculoskeletal:        General: Normal range of motion.     Cervical back: Normal range of motion and neck supple.  Lymphadenopathy:     Cervical: No cervical adenopathy.  Skin:    General: Skin is warm and dry.  Neurological:     Mental Status: He is alert and oriented to person, place, and time.     Cranial Nerves: No cranial nerve deficit.  Psychiatric:  Behavior: Behavior normal.        Thought Content: Thought content normal.        Judgment: Judgment normal.       Assessment/Plan: 1. Essential hypertension Will continue metoprolol and increase hydralazine to BID. May need to titrate up further in future. Also limit salt intake.  2. Morbid obesity with body mass index (BMI) of 40.0 to 44.9 in adult [facility]) Will set goal to lose at least 3lbs by next visit. He will need to really work on diet and limiting beer intake and increase his exercise. He will also have his labs updated.   General Counseling: jovani flury understanding of the findings of todays visit and agrees with plan of treatment. I have discussed any further diagnostic evaluation that may be needed or ordered today. We also reviewed his medications today. he has been encouraged to call the office with any questions or concerns that should arise related to todays visit.    No orders of the defined types were placed in this encounter.   No orders of the defined types were placed in this encounter.   This patient was seen by Lynn Ito, PA-C in collaboration with Dr. Beverely Risen as a part of collaborative care agreement.   Total time spent:30 Minutes Time spent includes review of chart, medications, test results, and follow up plan with the patient.      Dr Lyndon Code Internal medicine

## 2022-04-20 LAB — LIPID PANEL WITH LDL/HDL RATIO
Cholesterol, Total: 142 mg/dL (ref 100–199)
HDL: 33 mg/dL — ABNORMAL LOW (ref >39)
LDL Chol Calc (NIH): 87 mg/dL (ref 0–99)
LDL/HDL Ratio: 2.6 ratio (ref 0.0–3.6)
Triglycerides: 118 mg/dL (ref 0–149)
VLDL Cholesterol Cal: 22 mg/dL (ref 5–40)

## 2022-04-20 LAB — CBC WITH DIFFERENTIAL/PLATELET
Basophils Absolute: 0.1 10*3/uL (ref 0.0–0.2)
Basos: 1 %
EOS (ABSOLUTE): 0.1 10*3/uL (ref 0.0–0.4)
Eos: 1 %
Hematocrit: 47.1 % (ref 37.5–51.0)
Hemoglobin: 15.6 g/dL (ref 13.0–17.7)
Immature Grans (Abs): 0 10*3/uL (ref 0.0–0.1)
Immature Granulocytes: 0 %
Lymphocytes Absolute: 2.5 10*3/uL (ref 0.7–3.1)
Lymphs: 32 %
MCH: 28.4 pg (ref 26.6–33.0)
MCHC: 33.1 g/dL (ref 31.5–35.7)
MCV: 86 fL (ref 79–97)
Monocytes Absolute: 0.5 10*3/uL (ref 0.1–0.9)
Monocytes: 7 %
Neutrophils Absolute: 4.5 10*3/uL (ref 1.4–7.0)
Neutrophils: 59 %
Platelets: 224 10*3/uL (ref 150–450)
RBC: 5.5 x10E6/uL (ref 4.14–5.80)
RDW: 14.1 % (ref 11.6–15.4)
WBC: 7.7 10*3/uL (ref 3.4–10.8)

## 2022-04-20 LAB — COMPREHENSIVE METABOLIC PANEL
ALT: 20 IU/L (ref 0–44)
AST: 27 IU/L (ref 0–40)
Albumin/Globulin Ratio: 1.2 (ref 1.2–2.2)
Albumin: 3.7 g/dL — ABNORMAL LOW (ref 3.8–4.8)
Alkaline Phosphatase: 94 IU/L (ref 44–121)
BUN/Creatinine Ratio: 8 — ABNORMAL LOW (ref 10–24)
BUN: 8 mg/dL (ref 8–27)
Bilirubin Total: 0.4 mg/dL (ref 0.0–1.2)
CO2: 23 mmol/L (ref 20–29)
Calcium: 9.3 mg/dL (ref 8.6–10.2)
Chloride: 101 mmol/L (ref 96–106)
Creatinine, Ser: 1.01 mg/dL (ref 0.76–1.27)
Globulin, Total: 3.1 g/dL (ref 1.5–4.5)
Glucose: 109 mg/dL — ABNORMAL HIGH (ref 70–99)
Potassium: 5.4 mmol/L — ABNORMAL HIGH (ref 3.5–5.2)
Sodium: 140 mmol/L (ref 134–144)
Total Protein: 6.8 g/dL (ref 6.0–8.5)
eGFR: 83 mL/min/{1.73_m2} (ref >59)

## 2022-04-20 LAB — TSH+FREE T4
Free T4: 1.28 ng/dL (ref 0.82–1.77)
TSH: 2.18 u[IU]/mL (ref 0.450–4.500)

## 2022-04-20 LAB — PSA TOTAL (REFLEX TO FREE): Prostate Specific Ag, Serum: 2.8 ng/mL (ref 0.0–4.0)

## 2022-04-23 ENCOUNTER — Telehealth: Payer: Self-pay

## 2022-04-23 NOTE — Telephone Encounter (Signed)
Spoke to pt and let him know that his sugar was a little elevated on labs and we will monitor this, but to work on diet and exercise. Also his potassium is a little high and will need to follow this closely as well. If he is taking a potassium supplement he should stop it.  Pt was not taking any potassium supplements. Otherwise his labs looked ok.

## 2022-04-23 NOTE — Telephone Encounter (Signed)
-----   Message from Lauren K McDonough, PA-C sent at 04/23/2022  1:41 PM EDT ----- Please let him know that his sugar was a little elevated on labs and we will monitor this, but to work on diet and exercise. Also his potassium is a little high and will need to follow this closely as well. If he is taking a potassium supplement he should stop it. Otherwise his labs looked ok. 

## 2022-04-23 NOTE — Telephone Encounter (Signed)
-----   Message from Carlean Jews, PA-C sent at 04/23/2022  1:41 PM EDT ----- Please let him know that his sugar was a little elevated on labs and we will monitor this, but to work on diet and exercise. Also his potassium is a little high and will need to follow this closely as well. If he is taking a potassium supplement he should stop it. Otherwise his labs looked ok.

## 2022-04-29 ENCOUNTER — Other Ambulatory Visit: Payer: Self-pay | Admitting: Physician Assistant

## 2022-04-29 DIAGNOSIS — I10 Essential (primary) hypertension: Secondary | ICD-10-CM

## 2022-05-24 ENCOUNTER — Encounter: Payer: Self-pay | Admitting: Physician Assistant

## 2022-05-24 ENCOUNTER — Ambulatory Visit (INDEPENDENT_AMBULATORY_CARE_PROVIDER_SITE_OTHER): Payer: Medicare Other | Admitting: Physician Assistant

## 2022-05-24 DIAGNOSIS — I10 Essential (primary) hypertension: Secondary | ICD-10-CM | POA: Diagnosis not present

## 2022-05-24 DIAGNOSIS — E875 Hyperkalemia: Secondary | ICD-10-CM

## 2022-05-24 DIAGNOSIS — Z6841 Body Mass Index (BMI) 40.0 and over, adult: Secondary | ICD-10-CM

## 2022-05-24 DIAGNOSIS — R7301 Impaired fasting glucose: Secondary | ICD-10-CM | POA: Diagnosis not present

## 2022-05-24 LAB — POCT GLYCOSYLATED HEMOGLOBIN (HGB A1C): Hemoglobin A1C: 6.1 % — AB (ref 4.0–5.6)

## 2022-05-24 NOTE — Progress Notes (Signed)
[facility] [address] [place], [place] [address]  Internal MEDICINE  Office Visit Note  Patient Name: Shaun Patrick  [id number]  [id number]  Date of Service: 06/09/2022  Chief Complaint  Patient presents with   Follow-up   Gastroesophageal Reflux   Hypertension    HPI Pt is here for routine follow up -BP continues to be elevated in 140s-150 systolic at home and in office with recheck of 146/90 -will double hydralazine to 20mg  BID and then will call for 25mg  script when ready -Again discussed the need to work on wt loss and will improve diet and exercise. -His labs were reviewed over the phone since last visit and discussed again in person. He did have elevated glucose therefore an A1c was checked in office and was in prediabetic range. His potassium was also elevated and will stop any supplement with potassium and will recheck labs before next visit  Current Medication: Outpatient Encounter Medications as of 05/24/2022  Medication Sig Note   aspirin 81 MG chewable tablet CHEW 1 TABLET DAILY    clobetasol ointment (TEMOVATE) 0.05 % Apply 1 application topically 2 (two) times daily.    metoprolol succinate (TOPROL-XL) 25 MG 24 hr tablet TAKE 1 TABLET (25 MG TOTAL) BY MOUTH DAILY.    montelukast (SINGULAIR) 10 MG tablet TAKE 1 TABLET BY MOUTH EVERY DAY    Multiple Vitamin (MULTI-VITAMINS) TABS Take 1 tablet by mouth 1 day or 1 dose. 05/16/2015: Received from: [facility] Received Sig: Take by mouth.   pantoprazole (PROTONIX) 40 MG tablet Take 1 tablet (40 mg total) by mouth daily.    rosuvastatin (CRESTOR) 20 MG tablet TAKE 1 TABLET BY MOUTH EVERY DAY    sildenafil (VIAGRA) 100 MG tablet Take 0.5-1 tablets (50-100 mg total) by mouth daily as needed for erectile dysfunction.    triamcinolone cream (KENALOG) 0.1 % Apply 1 application topically 3 (three) times daily. To affected areas until resolved.    [DISCONTINUED] hydrALAZINE (APRESOLINE) 10 MG tablet  TAKE 1 TABLET BY MOUTH TWICE A DAY    No facility-administered encounter medications on file as of 05/24/2022.    Surgical History: Past Surgical History:  Procedure Laterality Date   HERNIA REPAIR     umbilical   JOINT REPLACEMENT Bilateral    knees   KNEE SURGERY Bilateral    knee replacements   NASAL SEPTOPLASTY W/ TURBINOPLASTY Bilateral 10/17/2015   Procedure: NASAL SEPTOPLASTY WITH TURBINATE REDUCTION;  Surgeon: 05/26/2022, MD;  Location: [facility] ORS;  Service: ENT;  Laterality: Bilateral;   NECK SURGERY  1979   broke neck   TONSILECTOMY, ADENOIDECTOMY, BILATERAL MYRINGOTOMY AND TUBES     TONSILLECTOMY      Medical History: Past Medical History:  Diagnosis Date   GERD (gastroesophageal reflux disease)    Hypertension     Family History: Family History  Problem Relation Age of Onset   Stroke Mother    Heart disease Mother    Hypertension Mother    Liver disease Father    Hypertension Sister     Social History   Socioeconomic History   Marital status: Married    Spouse name: Not on file   Number of children: Not on file   Years of education: Not on file   Highest education level: Not on file  Occupational History   Occupation: med lab techinician     Employer: [employer]  Tobacco Use   Smoking status: Former    Years: 10.00  Types: Cigarettes    Start date: 12/03/1983    Quit date: 12/02/1993    Years since quitting: 28.5   Smokeless tobacco: Never  Substance and Sexual Activity   Alcohol use: Yes    Alcohol/week: 4.0 standard drinks of alcohol    Types: 4 Cans of beer per week   Drug use: No   Sexual activity: Never  Other Topics Concern   Not on file  Social History Narrative   Not on file   Social Determinants of Health   Financial Resource Strain: Not on file  Food Insecurity: Not on file  Transportation Needs: Not on file  Physical Activity: Not on file  Stress: Not on file  Social Connections: Not on file   Intimate Partner Violence: Not on file      Review of Systems  Constitutional:  Negative for chills, fatigue and unexpected weight change.  HENT:  Negative for congestion, rhinorrhea, sneezing and sore throat.   Eyes:  Negative for redness.  Respiratory:  Negative for cough, chest tightness and shortness of breath.   Cardiovascular:  Negative for chest pain and palpitations.  Gastrointestinal:  Negative for abdominal pain, constipation, diarrhea, nausea and vomiting.  Genitourinary:  Negative for dysuria and frequency.  Musculoskeletal:  Negative for arthralgias, back pain, joint swelling and neck pain.  Skin:  Negative for rash.  Neurological: Negative.  Negative for tremors and numbness.  Hematological:  Negative for adenopathy. Does not bruise/bleed easily.  Psychiatric/Behavioral:  Negative for behavioral problems (Depression), sleep disturbance and suicidal ideas. The patient is not nervous/anxious.     Vital Signs: BP (!) 146/90 Comment: 190/96  Pulse 90   Temp 97.6 F (36.4 C)   Resp 16   Ht 6' (1.829 m)   Wt (!) 317 lb 12.8 oz (144.2 kg)   SpO2 98%   BMI 43.10 kg/m    Physical Exam Vitals and nursing note reviewed.  Constitutional:      General: He is not in acute distress.    Appearance: He is well-developed. He is obese. He is not diaphoretic.  HENT:     Head: Normocephalic and atraumatic.     Mouth/Throat:     Pharynx: No oropharyngeal exudate.  Eyes:     Pupils: Pupils are equal, round, and reactive to light.  Neck:     Thyroid: No thyromegaly.     Vascular: No JVD.     Trachea: No tracheal deviation.  Cardiovascular:     Rate and Rhythm: Normal rate and regular rhythm.     Heart sounds: Normal heart sounds. No murmur heard.    No friction rub. No gallop.  Pulmonary:     Effort: Pulmonary effort is normal. No respiratory distress.     Breath sounds: No wheezing or rales.  Chest:     Chest wall: No tenderness.  Abdominal:     General: Bowel  sounds are normal.     Palpations: Abdomen is soft.  Musculoskeletal:        General: Normal range of motion.     Cervical back: Normal range of motion and neck supple.  Lymphadenopathy:     Cervical: No cervical adenopathy.  Skin:    General: Skin is warm and dry.  Neurological:     Mental Status: He is alert and oriented to person, place, and time.     Cranial Nerves: No cranial nerve deficit.  Psychiatric:        Behavior: Behavior normal.  Thought Content: Thought content normal.        Judgment: Judgment normal.        Assessment/Plan: 1. Essential hypertension Mildly elevated in office and will double his current 10mg  dose to a total of 20mg  hydralazine BID and continue metoprolol as before. He will call when new script for 25mg  hydralazine BID needed  2. Impaired fasting glucose - POCT HgB A1C is 6.1 and in prediabetic range, Will need to monitor and improve diet and exercise  3. Serum potassium elevated Will stop any potassium supplements and will recheck labs prior to next visit - Potassium  4. Morbid obesity with body mass index (BMI) of 40.0 to 44.9 in adult [facility]) Again discussed the importance of working on weight loss. Will work on diet and exercise   General Counseling: rondle lohse understanding of the findings of todays visit and agrees with plan of treatment. I have discussed any further diagnostic evaluation that may be needed or ordered today. We also reviewed his medications today. he has been encouraged to call the office with any questions or concerns that should arise related to todays visit.    Orders Placed This Encounter  Procedures   Potassium   POCT HgB A1C    No orders of the defined types were placed in this encounter.   This patient was seen by , PA-C in collaboration with Dr. [facility] as a part of collaborative care agreement.   Total time spent:30 Minutes Time spent includes review of chart,  medications, test results, and follow up plan with the patient.      Dr Glori Luis Internal medicine

## 2022-06-20 ENCOUNTER — Encounter: Payer: Self-pay | Admitting: Physician Assistant

## 2022-06-20 ENCOUNTER — Ambulatory Visit (INDEPENDENT_AMBULATORY_CARE_PROVIDER_SITE_OTHER): Payer: Medicare Other | Admitting: Physician Assistant

## 2022-06-20 VITALS — BP 142/90 | HR 101 | Temp 98.0°F | Resp 16 | Ht 72.0 in | Wt 317.0 lb

## 2022-06-20 DIAGNOSIS — E875 Hyperkalemia: Secondary | ICD-10-CM | POA: Diagnosis not present

## 2022-06-20 DIAGNOSIS — I10 Essential (primary) hypertension: Secondary | ICD-10-CM | POA: Diagnosis not present

## 2022-06-20 DIAGNOSIS — Z6841 Body Mass Index (BMI) 40.0 and over, adult: Secondary | ICD-10-CM

## 2022-06-20 MED ORDER — HYDRALAZINE HCL 25 MG PO TABS: 25.0000 mg | ORAL_TABLET | Freq: Two times a day (BID) | ORAL | 2 refills | Status: DC

## 2022-06-20 NOTE — Progress Notes (Signed)
[facility] [address] [place], [place] [address]  Internal MEDICINE  Office Visit Note  Patient Name: Shaun Patrick  [id number]  [id number]  Date of Service: 06/26/2022  Chief Complaint  Patient presents with   Follow-up   Gastroesophageal Reflux   Hypertension    HPI Pt is here for routine follow up -130-140s systolic at home, taking 20mg  BID of hydralazine with old script. Will send new script for 25mg  BID. BP still borderline in office, but better at home. The slight increase to 25mg  BID should help to reduce BP into normal range. -still needs to have potassium rechecked and will do so now -Again discussed need for weight loss and working on diet and exercise. His wt is stable from last visit, but has not lost any despite numerous discussions. His wife is present in office and is also encouraging his wt loss. He states they will start walking together more and he will work on increasing water intake and reducing beer. -He is prediabetic and we did also discuss considering medication for lowering BG as well as helping wt loss  Current Medication: Outpatient Encounter Medications as of 06/20/2022  Medication Sig Note   aspirin 81 MG chewable tablet CHEW 1 TABLET DAILY    clobetasol ointment (TEMOVATE) 0.05 % Apply 1 application topically 2 (two) times daily.    hydrALAZINE (APRESOLINE) 25 MG tablet Take 1 tablet (25 mg total) by mouth in the morning and at bedtime.    metoprolol succinate (TOPROL-XL) 25 MG 24 hr tablet TAKE 1 TABLET (25 MG TOTAL) BY MOUTH DAILY.    montelukast (SINGULAIR) 10 MG tablet TAKE 1 TABLET BY MOUTH EVERY DAY    Multiple Vitamin (MULTI-VITAMINS) TABS Take 1 tablet by mouth 1 day or 1 dose. 05/16/2015: Received from: [facility] Received Sig: Take by mouth.   pantoprazole (PROTONIX) 40 MG tablet Take 1 tablet (40 mg total) by mouth daily.    rosuvastatin (CRESTOR) 20 MG tablet TAKE 1 TABLET BY MOUTH EVERY DAY    sildenafil  (VIAGRA) 100 MG tablet Take 0.5-1 tablets (50-100 mg total) by mouth daily as needed for erectile dysfunction.    triamcinolone cream (KENALOG) 0.1 % Apply 1 application topically 3 (three) times daily. To affected areas until resolved.    [DISCONTINUED] hydrALAZINE (APRESOLINE) 10 MG tablet TAKE 1 TABLET BY MOUTH TWICE A DAY    No facility-administered encounter medications on file as of 06/20/2022.    Surgical History: Past Surgical History:  Procedure Laterality Date   HERNIA REPAIR     umbilical   JOINT REPLACEMENT Bilateral    knees   KNEE SURGERY Bilateral    knee replacements   NASAL SEPTOPLASTY W/ TURBINOPLASTY Bilateral 10/17/2015   Procedure: NASAL SEPTOPLASTY WITH TURBINATE REDUCTION;  Surgeon: [facility], MD;  Location: [facility] ORS;  Service: ENT;  Laterality: Bilateral;   NECK SURGERY  1979   broke neck   TONSILECTOMY, ADENOIDECTOMY, BILATERAL MYRINGOTOMY AND TUBES     TONSILLECTOMY      Medical History: Past Medical History:  Diagnosis Date   GERD (gastroesophageal reflux disease)    Hypertension     Family History: Family History  Problem Relation Age of Onset   Stroke Mother    Heart disease Mother    Hypertension Mother    Liver disease Father    Hypertension Sister     Social History   Socioeconomic History   Marital status: Married    Spouse name: Not on  file   Number of children: Not on file   Years of education: Not on file   Highest education level: Not on file  Occupational History   Occupation: med lab techinician     Employer: [employer]  Tobacco Use   Smoking status: Former    Years: 10.00    Types: Cigarettes    Start date: 12/03/1983    Quit date: 12/02/1993    Years since quitting: 28.5   Smokeless tobacco: Never  Substance and Sexual Activity   Alcohol use: Yes    Alcohol/week: 4.0 standard drinks of alcohol    Types: 4 Cans of beer per week   Drug use: No   Sexual activity: Never  Other Topics Concern    Not on file  Social History Narrative   Not on file   Social Determinants of Health   Financial Resource Strain: Not on file  Food Insecurity: Not on file  Transportation Needs: Not on file  Physical Activity: Not on file  Stress: Not on file  Social Connections: Not on file  Intimate Partner Violence: Not on file      Review of Systems  Constitutional:  Negative for chills, fatigue and unexpected weight change.  HENT:  Negative for congestion, rhinorrhea, sneezing and sore throat.   Eyes:  Negative for redness.  Respiratory:  Negative for cough, chest tightness and shortness of breath.   Cardiovascular:  Negative for chest pain and palpitations.  Gastrointestinal:  Negative for abdominal pain, constipation, diarrhea, nausea and vomiting.  Genitourinary:  Negative for dysuria and frequency.  Musculoskeletal:  Negative for arthralgias, back pain, joint swelling and neck pain.  Skin:  Negative for rash.  Neurological: Negative.  Negative for tremors and numbness.  Hematological:  Negative for adenopathy. Does not bruise/bleed easily.  Psychiatric/Behavioral:  Negative for behavioral problems (Depression), sleep disturbance and suicidal ideas. The patient is not nervous/anxious.     Vital Signs: BP (!) 142/90 Comment: 154/87  Pulse (!) 101   Temp 98 F (36.7 C)   Resp 16   Ht 6' (1.829 m)   Wt (!) 317 lb (143.8 kg)   SpO2 95%   BMI 42.99 kg/m    Physical Exam Vitals and nursing note reviewed.  Constitutional:      General: He is not in acute distress.    Appearance: He is well-developed. He is obese. He is not diaphoretic.  HENT:     Head: Normocephalic and atraumatic.     Mouth/Throat:     Pharynx: No oropharyngeal exudate.  Eyes:     Pupils: Pupils are equal, round, and reactive to light.  Neck:     Thyroid: No thyromegaly.     Vascular: No JVD.     Trachea: No tracheal deviation.  Cardiovascular:     Rate and Rhythm: Normal rate and regular rhythm.      Heart sounds: Normal heart sounds. No murmur heard.    No friction rub. No gallop.  Pulmonary:     Effort: Pulmonary effort is normal. No respiratory distress.     Breath sounds: No wheezing or rales.  Chest:     Chest wall: No tenderness.  Abdominal:     General: Bowel sounds are normal.     Palpations: Abdomen is soft.  Musculoskeletal:        General: Normal range of motion.     Cervical back: Normal range of motion and neck supple.  Lymphadenopathy:     Cervical: No cervical  adenopathy.  Skin:    General: Skin is warm and dry.  Neurological:     Mental Status: He is alert and oriented to person, place, and time.     Cranial Nerves: No cranial nerve deficit.  Psychiatric:        Behavior: Behavior normal.        Thought Content: Thought content normal.        Judgment: Judgment normal.        Assessment/Plan: 1. Essential hypertension Will increase to 25mg  BID for further control and continue to monitor - hydrALAZINE (APRESOLINE) 25 MG tablet; Take 1 tablet (25 mg total) by mouth in the morning and at bedtime.  Dispense: 60 tablet; Refill: 2  2. Serum potassium elevated Will go for repeat labs  3. Morbid obesity with body mass index (BMI) of 40.0 to 44.9 in adult [facility]) Again discussed need for weight loss and working on diet and exercise Obesity Counseling: Had a lengthy discussion regarding patients BMI and weight issues. Patient was instructed on portion control as well as increased activity. Also discussed caloric restrictions with trying to maintain intake less than 2000 Kcal. Discussions were made in accordance with the 5As of weight management. Simple actions such as not eating late and if able to, taking a walk is suggested.   General Counseling: xion debruyne understanding of the findings of todays visit and agrees with plan of treatment. I have discussed any further diagnostic evaluation that may be needed or ordered today. We also reviewed his medications  today. he has been encouraged to call the office with any questions or concerns that should arise related to todays visit.    No orders of the defined types were placed in this encounter.   Meds ordered this encounter  Medications   hydrALAZINE (APRESOLINE) 25 MG tablet    Sig: Take 1 tablet (25 mg total) by mouth in the morning and at bedtime.    Dispense:  60 tablet    Refill:  2    This patient was seen by Glori Luis, PA-C in collaboration with Dr. Lynn Ito as a part of collaborative care agreement.   Total time spent:30 Minutes Time spent includes review of chart, medications, test results, and follow up plan with the patient.      Dr Beverely Risen Internal medicine

## 2022-07-05 ENCOUNTER — Ambulatory Visit: Payer: Medicare Other | Admitting: Physician Assistant

## 2022-07-08 DIAGNOSIS — H2513 Age-related nuclear cataract, bilateral: Secondary | ICD-10-CM | POA: Diagnosis not present

## 2022-07-08 DIAGNOSIS — H40003 Preglaucoma, unspecified, bilateral: Secondary | ICD-10-CM | POA: Diagnosis not present

## 2022-07-08 DIAGNOSIS — H547 Unspecified visual loss: Secondary | ICD-10-CM | POA: Diagnosis not present

## 2022-07-12 ENCOUNTER — Other Ambulatory Visit: Payer: Self-pay | Admitting: Physician Assistant

## 2022-07-12 DIAGNOSIS — E785 Hyperlipidemia, unspecified: Secondary | ICD-10-CM

## 2022-07-25 ENCOUNTER — Ambulatory Visit: Payer: Medicare Other | Admitting: Physician Assistant

## 2022-08-05 ENCOUNTER — Other Ambulatory Visit: Payer: Self-pay | Admitting: Physician Assistant

## 2022-08-05 DIAGNOSIS — I10 Essential (primary) hypertension: Secondary | ICD-10-CM

## 2022-08-08 ENCOUNTER — Encounter: Payer: Self-pay | Admitting: Physician Assistant

## 2022-08-08 ENCOUNTER — Ambulatory Visit (INDEPENDENT_AMBULATORY_CARE_PROVIDER_SITE_OTHER): Payer: Medicare Other | Admitting: Physician Assistant

## 2022-08-08 VITALS — BP 144/86 | HR 87 | Temp 98.4°F | Resp 16 | Ht 73.0 in | Wt 322.4 lb

## 2022-08-08 DIAGNOSIS — Z0001 Encounter for general adult medical examination with abnormal findings: Secondary | ICD-10-CM

## 2022-08-08 DIAGNOSIS — Z6841 Body Mass Index (BMI) 40.0 and over, adult: Secondary | ICD-10-CM

## 2022-08-08 DIAGNOSIS — R7303 Prediabetes: Secondary | ICD-10-CM | POA: Diagnosis not present

## 2022-08-08 DIAGNOSIS — R300 Dysuria: Secondary | ICD-10-CM | POA: Diagnosis not present

## 2022-08-08 DIAGNOSIS — E782 Mixed hyperlipidemia: Secondary | ICD-10-CM

## 2022-08-08 DIAGNOSIS — I10 Essential (primary) hypertension: Secondary | ICD-10-CM | POA: Diagnosis not present

## 2022-08-08 MED ORDER — METOPROLOL SUCCINATE ER 25 MG PO TB24: 25.0000 mg | ORAL_TABLET | Freq: Every day | ORAL | 1 refills | Status: DC

## 2022-08-08 MED ORDER — ZOSTER VAC RECOMB ADJUVANTED 50 MCG/0.5ML IM SUSR: 0.5000 mL | Freq: Once | INTRAMUSCULAR | 0 refills | Status: AC

## 2022-08-08 MED ORDER — PNEUMOCOCCAL 20-VAL CONJ VACC 0.5 ML IM SUSY: 0.5000 mL | PREFILLED_SYRINGE | INTRAMUSCULAR | 0 refills | Status: AC

## 2022-08-08 NOTE — Progress Notes (Signed)
[facility] [place], [place] [address]  Internal MEDICINE  Office Visit Note  Patient Name: Shaun Patrick  [id number]  [id number]  Date of Service: 08/14/2022  Chief Complaint  Patient presents with   Medicare Wellness   Hypertension   Quality Metric Gaps    Shingles Vaccine     HPI Pt is here for routine health maintenance examination -up 5lbs since last visit, despite discussion of working on weight loss goals and ways to help achieve this. This was discussed again today and patient needs to increase exercise and improve diet, as well as reduce beer intake and increase water. -Did discuss he is prediabetic and weight loss and avoiding sugars will help this as well. Did offer medication to help with sugar control and wt loss, but pt declines at this time. -has not had potassium rechecked and will go to lab for this -BP around [id number] systolic at home and has been stable. Slightly elevated in office. -Cologuard done last year, due in 2025 -Due for PNA and shingles vaccines  Current Medication: Outpatient Encounter Medications as of 08/08/2022  Medication Sig Note   aspirin 81 MG chewable tablet CHEW 1 TABLET DAILY    clobetasol ointment (TEMOVATE) [id number] % Apply 1 application topically 2 (two) times daily.    hydrALAZINE (APRESOLINE) 25 MG tablet Take 1 tablet (25 mg total) by mouth in the morning and at bedtime.    montelukast (SINGULAIR) 10 MG tablet TAKE 1 TABLET BY MOUTH EVERY DAY    Multiple Vitamin (MULTI-VITAMINS) TABS Take 1 tablet by mouth 1 day or 1 dose. 05/16/2015: Received from: [place]: Take by mouth.   pantoprazole (PROTONIX) 40 MG tablet Take 1 tablet (40 mg total) by mouth daily.    rosuvastatin (CRESTOR) 20 MG tablet TAKE 1 TABLET BY MOUTH EVERY DAY    sildenafil (VIAGRA) 100 MG tablet Take 0.5-1 tablets (50-100 mg total) by mouth daily as needed for erectile dysfunction.    triamcinolone cream (KENALOG)  0.1 % Apply 1 application topically 3 (three) times daily. To affected areas until resolved.    [DISCONTINUED] metoprolol succinate (TOPROL-XL) 25 MG 24 hr tablet TAKE 1 TABLET (25 MG TOTAL) BY MOUTH DAILY.    [DISCONTINUED] pneumococcal 20-valent conjugate vaccine (PREVNAR 20) 0.5 ML injection Inject 0.5 mLs into the muscle tomorrow at 10 am.    [DISCONTINUED] Zoster Vaccine Adjuvanted [facility])) injection Inject 0.5 mLs into the muscle once.    metoprolol succinate (TOPROL-XL) 25 MG 24 hr tablet Take 1 tablet (25 mg total) by mouth daily.    [EXPIRED] pneumococcal 20-valent conjugate vaccine (PREVNAR 20) 0.5 ML injection Inject 0.5 mLs into the muscle tomorrow at 10 am for 1 dose.    [EXPIRED] Zoster Vaccine Adjuvanted [facility]) injection Inject 0.5 mLs into the muscle once for 1 dose.    No facility-administered encounter medications on file as of 08/08/2022.    Surgical History: Past Surgical History:  Procedure Laterality Date   HERNIA REPAIR     umbilical   JOINT REPLACEMENT Bilateral    knees   KNEE SURGERY Bilateral    knee replacements   NASAL SEPTOPLASTY W/ TURBINOPLASTY Bilateral 10/17/2015   Procedure: NASAL SEPTOPLASTY WITH TURBINATE REDUCTION;  Surgeon: Beverly Gust, MD;  Location: [facility] ORS;  Service: ENT;  Laterality: Bilateral;   [place]   broke neck   TONSILECTOMY, ADENOIDECTOMY, BILATERAL MYRINGOTOMY AND TUBES     TONSILLECTOMY  Medical History: Past Medical History:  Diagnosis Date   GERD (gastroesophageal reflux disease)    Hypertension     Family History: Family History  Problem Relation Age of Onset   Stroke Mother    Heart disease Mother    Hypertension Mother    Liver disease Father    Hypertension Sister       Review of Systems  Constitutional:  Negative for chills, fatigue and unexpected weight change.  HENT:  Negative for congestion, rhinorrhea, sneezing and sore throat.   Eyes:  Negative for redness.  Respiratory:   Negative for cough, chest tightness and shortness of breath.   Cardiovascular:  Negative for chest pain and palpitations.  Gastrointestinal:  Negative for abdominal pain, constipation, diarrhea, nausea and vomiting.  Genitourinary:  Negative for dysuria and frequency.  Musculoskeletal:  Negative for arthralgias, back pain, joint swelling and neck pain.  Skin:  Negative for rash.  Neurological: Negative.  Negative for tremors and numbness.  Hematological:  Negative for adenopathy. Does not bruise/bleed easily.  Psychiatric/Behavioral:  Negative for behavioral problems (Depression), sleep disturbance and suicidal ideas. The patient is not nervous/anxious.      Vital Signs: BP (!) 144/86 Comment: 146/85  Pulse 87   Temp 98.4 F (36.9 C)   Resp 16   Ht 6\' 1"  (1.854 m)   Wt (!) 322 lb 6.4 oz (146.2 kg)   SpO2 98%   BMI 42.54 kg/m    Physical Exam Vitals and nursing note reviewed.  Constitutional:      General: He is not in acute distress.    Appearance: Normal appearance. He is well-developed. He is obese. He is not diaphoretic.  HENT:     Head: Normocephalic and atraumatic.     Mouth/Throat:     Pharynx: No oropharyngeal exudate.  Eyes:     Pupils: Pupils are equal, round, and reactive to light.  Neck:     Thyroid: No thyromegaly.     Vascular: No JVD.     Trachea: No tracheal deviation.  Cardiovascular:     Rate and Rhythm: Normal rate and regular rhythm.     Heart sounds: Normal heart sounds. No murmur heard.    No friction rub. No gallop.  Pulmonary:     Effort: Pulmonary effort is normal. No respiratory distress.     Breath sounds: No wheezing or rales.  Chest:     Chest wall: No tenderness.  Abdominal:     General: Bowel sounds are normal.     Palpations: Abdomen is soft.     Tenderness: There is no abdominal tenderness.  Musculoskeletal:        General: Normal range of motion.     Cervical back: Normal range of motion and neck supple.  Lymphadenopathy:      Cervical: No cervical adenopathy.  Skin:    General: Skin is warm and dry.  Neurological:     Mental Status: He is alert and oriented to person, place, and time.     Cranial Nerves: No cranial nerve deficit.  Psychiatric:        Behavior: Behavior normal.        Thought Content: Thought content normal.        Judgment: Judgment normal.      LABS: Recent Results (from the past 2160 hour(s))  POCT HgB A1C     Status: Abnormal   Collection Time: 05/24/22 11:16 AM  Result Value Ref Range   Hemoglobin A1C 6.1 (A) 4.0 -  5.6 %   HbA1c POC (<> result, manual entry)     HbA1c, POC (prediabetic range)     HbA1c, POC (controlled diabetic range)    UA/M w/rflx Culture, Routine     Status: None   Collection Time: 08/08/22  3:29 PM   Specimen: Urine   Urine  Result Value Ref Range   Specific Gravity, UA 1.028 1.005 - 1.030   pH, UA 5.5 5.0 - 7.5   Color, UA Yellow Yellow   Appearance Ur Clear Clear   Leukocytes,UA Negative Negative   Protein,UA Negative Negative/Trace   Glucose, UA Negative Negative   Ketones, UA Negative Negative   RBC, UA Negative Negative   Bilirubin, UA Negative Negative   Urobilinogen, Ur 0.2 0.2 - 1.0 mg/dL   Nitrite, UA Negative Negative   Microscopic Examination Comment     Comment: Microscopic follows if indicated.   Microscopic Examination See below:     Comment: Microscopic was indicated and was performed.   Urinalysis Reflex Comment     Comment: This specimen will not reflex to a Urine Culture.  Microscopic Examination     Status: None   Collection Time: 08/08/22  3:29 PM   Urine  Result Value Ref Range   WBC, UA None seen 0 - 5 /hpf   RBC, Urine 0-2 0 - 2 /hpf   Epithelial Cells (non renal) None seen 0 - 10 /hpf   Casts None seen None seen /lpf   Bacteria, UA None seen None seen/Few        Assessment/Plan: 1. Encounter for general adult medical examination with abnormal findings CPE performed, labs previously reviewed and pt will go for  repeat potassium lab. UTD on colon screening. Due for PNA and shingles vaccines  2. Essential hypertension Borderline in office, but stable at home, continue metoprolol and hydralazine as before - metoprolol succinate (TOPROL-XL) 25 MG 24 hr tablet; Take 1 tablet (25 mg total) by mouth daily.  Dispense: 90 tablet; Refill: 1  3. Mixed hyperlipidemia Continue crestor  4. Prediabetes Continue to work on diet and exercise. May consider adding medication to aid BG control and wt loss, however pt declines for now and will monitor A1c  5. Morbid obesity with body mass index (BMI) of 40.0 to 44.9 in adult [facility]) Obesity Counseling: Had a lengthy discussion regarding patients BMI and weight issues. Patient was instructed on portion control as well as increased activity. Also discussed caloric restrictions with trying to maintain intake less than 2000 Kcal. Discussions were made in accordance with the 5As of weight management. Simple actions such as not eating late and if able to, taking a walk is suggested.  6. Dysuria - UA/M w/rflx Culture, Routine   General Counseling: Zenas verbalizes understanding of the findings of todays visit and agrees with plan of treatment. I have discussed any further diagnostic evaluation that may be needed or ordered today. We also reviewed his medications today. he has been encouraged to call the office with any questions or concerns that should arise related to todays visit.    Counseling:    Orders Placed This Encounter  Procedures   Microscopic Examination   UA/M w/rflx Culture, Routine    Meds ordered this encounter  Medications   metoprolol succinate (TOPROL-XL) 25 MG 24 hr tablet    Sig: Take 1 tablet (25 mg total) by mouth daily.    Dispense:  90 tablet    Refill:  1    DX Code Needed  .  Zoster Vaccine Adjuvanted [facility]) injection    Sig: Inject 0.5 mLs into the muscle once for 1 dose.    Dispense:  0.5 mL    Refill:  0   pneumococcal  20-valent conjugate vaccine (PREVNAR 20) 0.5 ML injection    Sig: Inject 0.5 mLs into the muscle tomorrow at 10 am for 1 dose.    Dispense:  0.5 mL    Refill:  0    This patient was seen by Lynn Ito, PA-C in collaboration with Dr. Beverely Risen as a part of collaborative care agreement.  Total time spent:35 Minutes  Time spent includes review of chart, medications, test results, and follow up plan with the patient.     Lyndon Code, MD  Internal Medicine

## 2022-08-09 LAB — UA/M W/RFLX CULTURE, ROUTINE
Bilirubin, UA: NEGATIVE
Glucose, UA: NEGATIVE
Ketones, UA: NEGATIVE
Leukocytes,UA: NEGATIVE
Nitrite, UA: NEGATIVE
Protein,UA: NEGATIVE
RBC, UA: NEGATIVE
Specific Gravity, UA: 1.028 (ref 1.005–1.030)
Urobilinogen, Ur: 0.2 mg/dL (ref 0.2–1.0)
pH, UA: 5.5 (ref 5.0–7.5)

## 2022-08-09 LAB — MICROSCOPIC EXAMINATION
Bacteria, UA: NONE SEEN
Casts: NONE SEEN /lpf
Epithelial Cells (non renal): NONE SEEN /hpf (ref 0–10)
WBC, UA: NONE SEEN /hpf (ref 0–5)

## 2022-09-02 DIAGNOSIS — B351 Tinea unguium: Secondary | ICD-10-CM | POA: Diagnosis not present

## 2022-09-02 DIAGNOSIS — L600 Ingrowing nail: Secondary | ICD-10-CM | POA: Diagnosis not present

## 2022-09-02 DIAGNOSIS — M79675 Pain in left toe(s): Secondary | ICD-10-CM | POA: Diagnosis not present

## 2022-09-02 DIAGNOSIS — M79674 Pain in right toe(s): Secondary | ICD-10-CM | POA: Diagnosis not present

## 2022-09-17 ENCOUNTER — Other Ambulatory Visit: Payer: Self-pay | Admitting: Physician Assistant

## 2022-09-17 DIAGNOSIS — K219 Gastro-esophageal reflux disease without esophagitis: Secondary | ICD-10-CM

## 2022-09-17 DIAGNOSIS — I10 Essential (primary) hypertension: Secondary | ICD-10-CM

## 2022-09-19 ENCOUNTER — Encounter: Payer: Self-pay | Admitting: Physician Assistant

## 2022-09-19 ENCOUNTER — Ambulatory Visit (INDEPENDENT_AMBULATORY_CARE_PROVIDER_SITE_OTHER): Payer: Medicare Other | Admitting: Physician Assistant

## 2022-09-19 ENCOUNTER — Other Ambulatory Visit: Payer: Self-pay

## 2022-09-19 VITALS — BP 138/88 | HR 91 | Temp 98.3°F | Resp 16 | Ht 72.0 in | Wt 323.6 lb

## 2022-09-19 DIAGNOSIS — Z23 Encounter for immunization: Secondary | ICD-10-CM

## 2022-09-19 DIAGNOSIS — I10 Essential (primary) hypertension: Secondary | ICD-10-CM

## 2022-09-19 DIAGNOSIS — Z6841 Body Mass Index (BMI) 40.0 and over, adult: Secondary | ICD-10-CM

## 2022-09-19 DIAGNOSIS — E1165 Type 2 diabetes mellitus with hyperglycemia: Secondary | ICD-10-CM

## 2022-09-19 LAB — POCT GLYCOSYLATED HEMOGLOBIN (HGB A1C): Hemoglobin A1C: 6.8 % — AB (ref 4.0–5.6)

## 2022-09-19 MED ORDER — RYBELSUS 3 MG PO TABS: 3.0000 mg | ORAL_TABLET | Freq: Every day | ORAL | 2 refills | Status: DC

## 2022-09-19 MED ORDER — ACCU-CHEK GUIDE ME W/DEVICE KIT: PACK | 0 refills | Status: AC

## 2022-09-19 MED ORDER — ACCU-CHEK GUIDE VI STRP: 1.0000 | ORAL_STRIP | 1 refills | Status: AC | PRN

## 2022-09-19 MED ORDER — ACCU-CHEK SOFTCLIX LANCETS MISC: 1 refills | Status: AC

## 2022-09-19 NOTE — Progress Notes (Signed)
[facility] [place], [place] [address]  Internal MEDICINE  Office Visit Note  Patient Name: Shaun Patrick  [id number]  [id number]  Date of Service: 09/19/2022  Chief Complaint  Patient presents with   Follow-up   Hypertension   Gastroesophageal Reflux    HPI Pt is here for routine follow up with his wife -he has gained another pound since last visit. We have discussed changing diet, increasing exercise, reducing beer intake, increasing water intake, but he has not made these changes still and reiterated these today. -Still has not gotten blood work for repeat potassium either -A1c rechecked and is now in diabetic range which further emphasized need for lifestyle changes and will start on rybelsus to help with sugar and weight -Given diet guide and will send meter to check BG  Current Medication: Outpatient Encounter Medications as of 09/19/2022  Medication Sig Note   aspirin 81 MG chewable tablet CHEW 1 TABLET DAILY    clobetasol ointment (TEMOVATE) 0.94 % Apply 1 application topically 2 (two) times daily.    hydrALAZINE (APRESOLINE) 25 MG tablet TAKE 1 TABLET (25 MG) BY MOUTH IN THE MORNING AND AT BEDTIME    metoprolol succinate (TOPROL-XL) 25 MG 24 hr tablet Take 1 tablet (25 mg total) by mouth daily.    montelukast (SINGULAIR) 10 MG tablet TAKE 1 TABLET BY MOUTH EVERY DAY    Multiple Vitamin (MULTI-VITAMINS) TABS Take 1 tablet by mouth 1 day or 1 dose. 05/16/2015: Received from: [place]: Take by mouth.   pantoprazole (PROTONIX) 40 MG tablet TAKE 1 TABLET BY MOUTH EVERY DAY    rosuvastatin (CRESTOR) 20 MG tablet TAKE 1 TABLET BY MOUTH EVERY DAY    Semaglutide (RYBELSUS) 3 MG TABS Take 3 mg by mouth daily.    sildenafil (VIAGRA) 100 MG tablet Take 0.5-1 tablets (50-100 mg total) by mouth daily as needed for erectile dysfunction.    triamcinolone cream (KENALOG) 0.1 % Apply 1 application topically 3 (three) times  daily. To affected areas until resolved.    [DISCONTINUED] Accu-Chek Softclix Lancets lancets by Other route. Use as directed once a daily DX E11.65    [DISCONTINUED] Blood Glucose Monitoring Suppl (ACCU-CHEK GUIDE ME) w/Device KIT by Does not apply route. USE AS DIRECTED TO CHECK GLUCOSE dxE11.65    [DISCONTINUED] glucose blood (ACCU-CHEK GUIDE) test strip 1 each by Other route as needed for other. Use as instructed once daily DX E11.65    No facility-administered encounter medications on file as of 09/19/2022.    Surgical History: Past Surgical History:  Procedure Laterality Date   HERNIA REPAIR     umbilical   JOINT REPLACEMENT Bilateral    knees   KNEE SURGERY Bilateral    knee replacements   NASAL SEPTOPLASTY W/ TURBINOPLASTY Bilateral 10/17/2015   Procedure: NASAL SEPTOPLASTY WITH TURBINATE REDUCTION;  Surgeon: Beverly Gust, MD;  Location: [facility] ORS;  Service: ENT;  Laterality: Bilateral;   [place]   broke neck   TONSILECTOMY, ADENOIDECTOMY, BILATERAL MYRINGOTOMY AND TUBES     TONSILLECTOMY      Medical History: Past Medical History:  Diagnosis Date   GERD (gastroesophageal reflux disease)    Hypertension     Family History: Family History  Problem Relation Age of Onset   Stroke Mother    Heart disease Mother    Hypertension Mother    Liver disease Father    Hypertension Sister     Social History  Socioeconomic History   Marital status: Married    Spouse name: Not on file   Number of children: Not on file   Years of education: Not on file   Highest education level: Not on file  Occupational History   Occupation: med lab techinician     Employer: [employer]  Tobacco Use   Smoking status: Former    Years: 10.00    Types: Cigarettes    Start date: 12/03/1983    Quit date: 12/02/1993    Years since quitting: 28.8   Smokeless tobacco: Never  Substance and Sexual Activity   Alcohol use: Yes    Alcohol/week: 4.0 standard  drinks of alcohol    Types: 4 Cans of beer per week   Drug use: No   Sexual activity: Never  Other Topics Concern   Not on file  Social History Narrative   Not on file   Social Determinants of Health   Financial Resource Strain: Not on file  Food Insecurity: Not on file  Transportation Needs: Not on file  Physical Activity: Not on file  Stress: Not on file  Social Connections: Not on file  Intimate Partner Violence: Not on file      Review of Systems  Constitutional:  Negative for chills, fatigue and unexpected weight change.  HENT:  Negative for congestion, rhinorrhea, sneezing and sore throat.   Eyes:  Negative for redness.  Respiratory:  Negative for cough, chest tightness and shortness of breath.   Cardiovascular:  Negative for chest pain and palpitations.  Gastrointestinal:  Negative for abdominal pain, constipation, diarrhea, nausea and vomiting.  Genitourinary:  Negative for dysuria and frequency.  Musculoskeletal:  Negative for arthralgias, back pain, joint swelling and neck pain.  Skin:  Negative for rash.  Neurological: Negative.  Negative for tremors and numbness.  Hematological:  Negative for adenopathy. Does not bruise/bleed easily.  Psychiatric/Behavioral:  Negative for behavioral problems (Depression), sleep disturbance and suicidal ideas. The patient is not nervous/anxious.     Vital Signs: BP 138/88 Comment: 145/74  Pulse 91   Temp 98.3 F (36.8 C)   Resp 16   Ht 6' (1.829 m)   Wt (!) 323 lb 9.6 oz (146.8 kg)   SpO2 98%   BMI 43.89 kg/m    Physical Exam Vitals and nursing note reviewed.  Constitutional:      General: He is not in acute distress.    Appearance: Normal appearance. He is well-developed. He is obese. He is not diaphoretic.  HENT:     Head: Normocephalic and atraumatic.     Mouth/Throat:     Pharynx: No oropharyngeal exudate.  Eyes:     Pupils: Pupils are equal, round, and reactive to light.  Neck:     Thyroid: No thyromegaly.      Vascular: No JVD.     Trachea: No tracheal deviation.  Cardiovascular:     Rate and Rhythm: Normal rate and regular rhythm.     Heart sounds: Normal heart sounds. No murmur heard.    No friction rub. No gallop.  Pulmonary:     Effort: Pulmonary effort is normal. No respiratory distress.     Breath sounds: No wheezing or rales.  Chest:     Chest wall: No tenderness.  Abdominal:     General: Bowel sounds are normal.     Palpations: Abdomen is soft.     Tenderness: There is no abdominal tenderness.  Musculoskeletal:        General:  Normal range of motion.     Cervical back: Normal range of motion and neck supple.  Lymphadenopathy:     Cervical: No cervical adenopathy.  Skin:    General: Skin is warm and dry.  Neurological:     Mental Status: He is alert and oriented to person, place, and time.     Cranial Nerves: No cranial nerve deficit.  Psychiatric:        Behavior: Behavior normal.        Thought Content: Thought content normal.        Judgment: Judgment normal.        Assessment/Plan: 1. Type 2 diabetes mellitus with hyperglycemia, without long-term current use of insulin (HCC) - POCT HgB A1C is 6.8 which is increased from 6.1 last visit and is now in diabetic range. Will start on rybelsus daily and start monitoring BG at home. Given info for diabetic meal guide and emphasized need to change diet and exercise - Semaglutide (RYBELSUS) 3 MG TABS; Take 3 mg by mouth daily.  Dispense: 30 tablet; Refill: 2  2. Essential hypertension Stable, continue current medication  3. Morbid obesity with body mass index (BMI) of 40.0 to 44.9 in adult [facility]) Needs to work on diet and exercise  4. Flu vaccine need - Flu Vaccine MDCK QUAD PF   General Counseling: Brylan verbalizes understanding of the findings of todays visit and agrees with plan of treatment. I have discussed any further diagnostic evaluation that may be needed or ordered today. We also reviewed his medications  today. he has been encouraged to call the office with any questions or concerns that should arise related to todays visit.    Orders Placed This Encounter  Procedures   Flu Vaccine MDCK QUAD PF   POCT HgB A1C    Meds ordered this encounter  Medications   Semaglutide (RYBELSUS) 3 MG TABS    Sig: Take 3 mg by mouth daily.    Dispense:  30 tablet    Refill:  2    This patient was seen by Drema Dallas, PA-C in collaboration with Dr. Clayborn Bigness as a part of collaborative care agreement.   Total time spent:30 Minutes Time spent includes review of chart, medications, test results, and follow up plan with the patient.      Dr Lavera Guise Internal medicine

## 2022-10-02 ENCOUNTER — Ambulatory Visit (INDEPENDENT_AMBULATORY_CARE_PROVIDER_SITE_OTHER): Payer: Medicare Other | Admitting: Nurse Practitioner

## 2022-10-02 ENCOUNTER — Encounter: Payer: Self-pay | Admitting: Nurse Practitioner

## 2022-10-02 VITALS — BP 164/92 | HR 95 | Temp 98.1°F | Resp 16 | Ht 72.0 in | Wt 324.4 lb

## 2022-10-02 DIAGNOSIS — S29012D Strain of muscle and tendon of back wall of thorax, subsequent encounter: Secondary | ICD-10-CM

## 2022-10-02 DIAGNOSIS — R109 Unspecified abdominal pain: Secondary | ICD-10-CM

## 2022-10-02 MED ORDER — CYCLOBENZAPRINE HCL 5 MG PO TABS: 5.0000 mg | ORAL_TABLET | Freq: Three times a day (TID) | ORAL | 1 refills | Status: DC | PRN

## 2022-10-02 NOTE — Progress Notes (Signed)
[facility] [place], [place] [address]  Internal MEDICINE  Office Visit Note  Patient Name: Shaun Patrick  [id number]  [id number]  Date of Service: 10/02/2022  Chief Complaint  Patient presents with   Acute Visit    Hip pain     HPI Shaun Patrick presents for an acute sick visit for --Right side/flank pain Started last night Denies any nausea, vomiting, diarrhea, constipation or distention or abdominal pain Denies any urgency, frequency or difficulty urinated.    Current Medication:  Outpatient Encounter Medications as of 10/02/2022  Medication Sig Note   Accu-Chek Softclix Lancets lancets Use as directed once a daily DX E11.65    aspirin 81 MG chewable tablet CHEW 1 TABLET DAILY    Blood Glucose Monitoring Suppl (ACCU-CHEK GUIDE ME) w/Device KIT USE AS DIRECTED TO CHECK GLUCOSE dxE11.65    clobetasol ointment (TEMOVATE) 7.42 % Apply 1 application topically 2 (two) times daily.    cyclobenzaprine (FLEXERIL) 5 MG tablet Take 1 tablet (5 mg total) by mouth 3 (three) times daily as needed for muscle spasms.    glucose blood (ACCU-CHEK GUIDE) test strip 1 each by Other route as needed for other. Use as instructed once daily DX E11.65    hydrALAZINE (APRESOLINE) 25 MG tablet TAKE 1 TABLET (25 MG) BY MOUTH IN THE MORNING AND AT BEDTIME    metoprolol succinate (TOPROL-XL) 25 MG 24 hr tablet Take 1 tablet (25 mg total) by mouth daily.    Multiple Vitamin (MULTI-VITAMINS) TABS Take 1 tablet by mouth 1 day or 1 dose. 05/16/2015: Received from: [place]: Take by mouth.   pantoprazole (PROTONIX) 40 MG tablet TAKE 1 TABLET BY MOUTH EVERY DAY    rosuvastatin (CRESTOR) 20 MG tablet TAKE 1 TABLET BY MOUTH EVERY DAY    Semaglutide (RYBELSUS) 3 MG TABS Take 3 mg by mouth daily.    sildenafil (VIAGRA) 100 MG tablet Take 0.5-1 tablets (50-100 mg total) by mouth daily as needed for erectile dysfunction.    triamcinolone cream (KENALOG) 0.1 %  Apply 1 application topically 3 (three) times daily. To affected areas until resolved.    [DISCONTINUED] montelukast (SINGULAIR) 10 MG tablet TAKE 1 TABLET BY MOUTH EVERY DAY    No facility-administered encounter medications on file as of 10/02/2022.      Medical History: Past Medical History:  Diagnosis Date   GERD (gastroesophageal reflux disease)    Hypertension      Vital Signs: BP (!) 164/92   Pulse 95   Temp 98.1 F (36.7 C)   Resp 16   Ht 6' (1.829 m)   Wt (!) 324 lb 6.4 oz (147.1 kg)   SpO2 97%   BMI 44.00 kg/m    Review of Systems  Constitutional: Negative.  Negative for chills, fatigue and fever.  Respiratory: Negative.  Negative for cough, chest tightness, shortness of breath and wheezing.   Cardiovascular: Negative.  Negative for chest pain and palpitations.  Gastrointestinal: Negative.  Negative for abdominal distention, abdominal pain, constipation, diarrhea, nausea and vomiting.  Genitourinary:  Positive for flank pain. Negative for dysuria, frequency and urgency.  Musculoskeletal:  Positive for back pain and myalgias.    Physical Exam Vitals reviewed.  Constitutional:      General: He is not in acute distress.    Appearance: Normal appearance. He is obese. He is not ill-appearing.  HENT:     Head: Normocephalic and atraumatic.  Eyes:     Pupils: Pupils  are equal, round, and reactive to light.  Cardiovascular:     Rate and Rhythm: Normal rate and regular rhythm.     Heart sounds: Normal heart sounds. No murmur heard. Pulmonary:     Effort: Pulmonary effort is normal. No respiratory distress.     Breath sounds: Normal breath sounds. No wheezing.  Abdominal:     General: Bowel sounds are normal. There is no distension.     Palpations: Abdomen is soft.     Tenderness: There is no abdominal tenderness. There is no right CVA tenderness or left CVA tenderness.     Hernia: No hernia is present.  Neurological:     Mental Status: He is alert and  oriented to person, place, and time.  Psychiatric:        Mood and Affect: Mood normal.        Behavior: Behavior normal.       Assessment/Plan: 1. Acute right flank pain Take cyclobenzaprine as needed for musculoskeletal pain and spasms.  - cyclobenzaprine (FLEXERIL) 5 MG tablet; Take 1 tablet (5 mg total) by mouth 3 (three) times daily as needed for muscle spasms.  Dispense: 30 tablet; Refill: 1  2. Strain of latissimus dorsi muscle, subsequent encounter Muscle relaxant prescribed and patient provided with stretches, may also apply ice and/or heat.   General Counseling: foch rosenwald understanding of the findings of todays visit and agrees with plan of treatment. I have discussed any further diagnostic evaluation that may be needed or ordered today. We also reviewed his medications today. he has been encouraged to call the office with any questions or concerns that should arise related to todays visit.    Counseling:    No orders of the defined types were placed in this encounter.   Meds ordered this encounter  Medications   cyclobenzaprine (FLEXERIL) 5 MG tablet    Sig: Take 1 tablet (5 mg total) by mouth 3 (three) times daily as needed for muscle spasms.    Dispense:  30 tablet    Refill:  1    Return if symptoms worsen or fail to improve.  [place] Controlled Substance Database was reviewed by me for overdose risk score (ORS)  Time spent:20 Minutes Time spent with patient included reviewing progress notes, labs, imaging studies, and discussing plan for follow up.   This patient was seen by Shaun Osgood, FNP-C in collaboration with Dr. Clayborn Bigness as a part of collaborative care agreement.  Nasim Garofano R. Valetta Fuller, MSN, FNP-C Internal Medicine

## 2022-10-07 ENCOUNTER — Other Ambulatory Visit: Payer: Self-pay | Admitting: Physician Assistant

## 2022-10-07 DIAGNOSIS — J302 Other seasonal allergic rhinitis: Secondary | ICD-10-CM

## 2022-10-10 ENCOUNTER — Ambulatory Visit: Payer: Medicare Other | Admitting: Physician Assistant

## 2022-10-11 ENCOUNTER — Encounter: Payer: Self-pay | Admitting: Nurse Practitioner

## 2022-10-17 ENCOUNTER — Ambulatory Visit (INDEPENDENT_AMBULATORY_CARE_PROVIDER_SITE_OTHER): Payer: Medicare Other | Admitting: Physician Assistant

## 2022-10-17 ENCOUNTER — Encounter: Payer: Self-pay | Admitting: Physician Assistant

## 2022-10-17 VITALS — BP 123/89 | HR 91 | Temp 97.3°F | Resp 16 | Ht 72.0 in | Wt 324.0 lb

## 2022-10-17 DIAGNOSIS — Z91148 Patient's other noncompliance with medication regimen for other reason: Secondary | ICD-10-CM | POA: Diagnosis not present

## 2022-10-17 DIAGNOSIS — Z91199 Patient's noncompliance with other medical treatment and regimen due to unspecified reason: Secondary | ICD-10-CM

## 2022-10-17 DIAGNOSIS — I10 Essential (primary) hypertension: Secondary | ICD-10-CM | POA: Diagnosis not present

## 2022-10-17 DIAGNOSIS — E1165 Type 2 diabetes mellitus with hyperglycemia: Secondary | ICD-10-CM

## 2022-10-17 DIAGNOSIS — Z6841 Body Mass Index (BMI) 40.0 and over, adult: Secondary | ICD-10-CM

## 2022-10-17 NOTE — Progress Notes (Signed)
[facility] [place], [place] [address]  Internal MEDICINE  Office Visit Note  Patient Name: Shaun Patrick  [id number]  [id number]  Date of Service: 10/22/2022  Chief Complaint  Patient presents with   Follow-up   Hypertension   Gastroesophageal Reflux    HPI Pt is here for routine follow up -Has not taken the rybelsus. He cannot account for why he didn't start on this. States he picked it up, but hasn't tried it and will start now. Discussed today's follow up was to determine how he was doing on the medication and potentially increase it to help sugar and wt loss as he has struggled with this on his own. -He will start now. He is up another pound and discussed again the need to get his weight down and exercise. -He does have joint pain and knows his weight needs to be down before joint replacement surgery can be done -BP is improved today -Still never had potassium rechecked, the one time he went the couldn't find a vein and again discussed importance of increasing water intake and reducing beer intake  Current Medication: Outpatient Encounter Medications as of 10/17/2022  Medication Sig Note   Accu-Chek Softclix Lancets lancets Use as directed once a daily DX E11.65    aspirin 81 MG chewable tablet CHEW 1 TABLET DAILY    Blood Glucose Monitoring Suppl (ACCU-CHEK GUIDE ME) w/Device KIT USE AS DIRECTED TO CHECK GLUCOSE dxE11.65    clobetasol ointment (TEMOVATE) 2.42 % Apply 1 application topically 2 (two) times daily.    cyclobenzaprine (FLEXERIL) 5 MG tablet Take 1 tablet (5 mg total) by mouth 3 (three) times daily as needed for muscle spasms.    glucose blood (ACCU-CHEK GUIDE) test strip 1 each by Other route as needed for other. Use as instructed once daily DX E11.65    hydrALAZINE (APRESOLINE) 25 MG tablet TAKE 1 TABLET (25 MG) BY MOUTH IN THE MORNING AND AT BEDTIME    metoprolol succinate (TOPROL-XL) 25 MG 24 hr tablet Take 1 tablet (25 mg total) by  mouth daily.    montelukast (SINGULAIR) 10 MG tablet TAKE 1 TABLET BY MOUTH EVERY DAY    Multiple Vitamin (MULTI-VITAMINS) TABS Take 1 tablet by mouth 1 day or 1 dose. 05/16/2015: Received from: [place]: Take by mouth.   pantoprazole (PROTONIX) 40 MG tablet TAKE 1 TABLET BY MOUTH EVERY DAY    rosuvastatin (CRESTOR) 20 MG tablet TAKE 1 TABLET BY MOUTH EVERY DAY    Semaglutide (RYBELSUS) 3 MG TABS Take 3 mg by mouth daily.    sildenafil (VIAGRA) 100 MG tablet Take 0.5-1 tablets (50-100 mg total) by mouth daily as needed for erectile dysfunction.    triamcinolone cream (KENALOG) 0.1 % Apply 1 application topically 3 (three) times daily. To affected areas until resolved.    No facility-administered encounter medications on file as of 10/17/2022.    Surgical History: Past Surgical History:  Procedure Laterality Date   HERNIA REPAIR     umbilical   JOINT REPLACEMENT Bilateral    knees   KNEE SURGERY Bilateral    knee replacements   NASAL SEPTOPLASTY W/ TURBINOPLASTY Bilateral 10/17/2015   Procedure: NASAL SEPTOPLASTY WITH TURBINATE REDUCTION;  Surgeon: Beverly Gust, MD;  Location: [facility] ORS;  Service: ENT;  Laterality: Bilateral;   [place]   broke neck   TONSILECTOMY, ADENOIDECTOMY, BILATERAL MYRINGOTOMY AND TUBES     TONSILLECTOMY  Medical History: Past Medical History:  Diagnosis Date   GERD (gastroesophageal reflux disease)    Hypertension     Family History: Family History  Problem Relation Age of Onset   Stroke Mother    Heart disease Mother    Hypertension Mother    Liver disease Father    Hypertension Sister     Social History   Socioeconomic History   Marital status: Married    Spouse name: Not on file   Number of children: Not on file   Years of education: Not on file   Highest education level: Not on file  Occupational History   Occupation: med lab techinician     Employer: [employer]   Tobacco Use   Smoking status: Former    Years: 10.00    Types: Cigarettes    Start date: 12/03/1983    Quit date: 12/02/1993    Years since quitting: 28.9   Smokeless tobacco: Never  Substance and Sexual Activity   Alcohol use: Yes    Alcohol/week: 4.0 standard drinks of alcohol    Types: 4 Cans of beer per week   Drug use: No   Sexual activity: Never  Other Topics Concern   Not on file  Social History Narrative   Not on file   Social Determinants of Health   Financial Resource Strain: Not on file  Food Insecurity: Not on file  Transportation Needs: Not on file  Physical Activity: Not on file  Stress: Not on file  Social Connections: Not on file  Intimate Partner Violence: Not on file      Review of Systems  Constitutional:  Negative for chills, fatigue and unexpected weight change.  HENT:  Negative for congestion, rhinorrhea, sneezing and sore throat.   Eyes:  Negative for redness.  Respiratory:  Negative for cough, chest tightness and shortness of breath.   Cardiovascular:  Negative for chest pain and palpitations.  Gastrointestinal:  Negative for abdominal pain, constipation, diarrhea, nausea and vomiting.  Genitourinary:  Negative for dysuria and frequency.  Musculoskeletal:  Positive for arthralgias. Negative for back pain, joint swelling and neck pain.  Skin:  Negative for rash.  Neurological: Negative.  Negative for tremors and numbness.  Hematological:  Negative for adenopathy. Does not bruise/bleed easily.  Psychiatric/Behavioral:  Negative for behavioral problems (Depression), sleep disturbance and suicidal ideas. The patient is not nervous/anxious.     Vital Signs: BP 123/89   Pulse 91   Temp (!) 97.3 F (36.3 C)   Resp 16   Ht 6' (1.829 m)   Wt (!) 324 lb (147 kg)   SpO2 96%   BMI 43.94 kg/m    Physical Exam Vitals and nursing note reviewed.  Constitutional:      General: He is not in acute distress.    Appearance: Normal appearance. He is  well-developed. He is obese. He is not diaphoretic.  HENT:     Head: Normocephalic and atraumatic.     Mouth/Throat:     Pharynx: No oropharyngeal exudate.  Eyes:     Pupils: Pupils are equal, round, and reactive to light.  Neck:     Thyroid: No thyromegaly.     Vascular: No JVD.     Trachea: No tracheal deviation.  Cardiovascular:     Rate and Rhythm: Normal rate and regular rhythm.     Heart sounds: Normal heart sounds. No murmur heard.    No friction rub. No gallop.  Pulmonary:     Effort:  Pulmonary effort is normal. No respiratory distress.     Breath sounds: No wheezing or rales.  Chest:     Chest wall: No tenderness.  Abdominal:     General: Bowel sounds are normal.     Palpations: Abdomen is soft.     Tenderness: There is no abdominal tenderness.  Musculoskeletal:        General: Normal range of motion.     Cervical back: Normal range of motion and neck supple.  Lymphadenopathy:     Cervical: No cervical adenopathy.  Skin:    General: Skin is warm and dry.  Neurological:     Mental Status: He is alert and oriented to person, place, and time.     Cranial Nerves: No cranial nerve deficit.  Psychiatric:        Behavior: Behavior normal.        Thought Content: Thought content normal.        Judgment: Judgment normal.        Assessment/Plan: 1. Type 2 diabetes mellitus with hyperglycemia, without long-term current use of insulin ([place]) Will start Rybelsus now and work on diet and exercise  2. Essential hypertension Improved, continue current medications  3. Noncompliance with diet and medication regimen Again discussed importance of working on weight loss, changing diet and exercise, and taking medications as prescribed  4. Morbid obesity with body mass index (BMI) of 40.0 to 44.9 in adult [facility]) Work on diet and exercise and start on rybelsus to help BG and wt loss   General Counseling: Wilmon verbalizes understanding of the findings of todays visit and  agrees with plan of treatment. I have discussed any further diagnostic evaluation that may be needed or ordered today. We also reviewed his medications today. he has been encouraged to call the office with any questions or concerns that should arise related to todays visit.    No orders of the defined types were placed in this encounter.   No orders of the defined types were placed in this encounter.   This patient was seen by Drema Dallas, PA-C in collaboration with Dr. Clayborn Bigness as a part of collaborative care agreement.   Total time spent:30 Minutes Time spent includes review of chart, medications, test results, and follow up plan with the patient.      Dr Lavera Guise Internal medicine

## 2022-11-28 ENCOUNTER — Encounter: Payer: Self-pay | Admitting: Physician Assistant

## 2022-11-28 ENCOUNTER — Ambulatory Visit (INDEPENDENT_AMBULATORY_CARE_PROVIDER_SITE_OTHER): Payer: Medicare Other | Admitting: Physician Assistant

## 2022-11-28 VITALS — BP 138/78 | HR 94 | Temp 98.2°F | Resp 16 | Ht 72.0 in | Wt 326.4 lb

## 2022-11-28 DIAGNOSIS — E1165 Type 2 diabetes mellitus with hyperglycemia: Secondary | ICD-10-CM | POA: Diagnosis not present

## 2022-11-28 DIAGNOSIS — Z6841 Body Mass Index (BMI) 40.0 and over, adult: Secondary | ICD-10-CM

## 2022-11-28 DIAGNOSIS — I10 Essential (primary) hypertension: Secondary | ICD-10-CM | POA: Diagnosis not present

## 2022-11-28 MED ORDER — RYBELSUS 7 MG PO TABS: 7.0000 mg | ORAL_TABLET | Freq: Every day | ORAL | 2 refills | Status: DC

## 2022-11-28 MED ORDER — HYDRALAZINE HCL 25 MG PO TABS: 25.0000 mg | ORAL_TABLET | Freq: Two times a day (BID) | ORAL | 0 refills | Status: DC

## 2022-11-28 NOTE — Progress Notes (Signed)
[facility] [place], [place] [address]  Internal MEDICINE  Office Visit Note  Patient Name: Shaun Patrick  [id number]  [id number]  Date of Service: 12/10/2022  Chief Complaint  Patient presents with   Follow-up    Weight Loss   Gastroesophageal Reflux   Hypertension    HPI Pt is here for routine follow up, his wife is with him -unfortunately he continues to gain weight and has not followed through on previous discussions regarding weight loss goals, exercising, reducing beer intake and increasing water intake. -He did start rybelsus and has not had any side effects. Will increase dose to help weight and sugar further. Again reminded him that he is now diabetic and really needs to work  Current Medication: Outpatient Encounter Medications as of 11/28/2022  Medication Sig Note   Accu-Chek Softclix Lancets lancets Use as directed once a daily DX E11.65    aspirin 81 MG chewable tablet CHEW 1 TABLET DAILY    Blood Glucose Monitoring Suppl (ACCU-CHEK GUIDE ME) w/Device KIT USE AS DIRECTED TO CHECK GLUCOSE dxE11.65    clobetasol ointment (TEMOVATE) 4.50 % Apply 1 application topically 2 (two) times daily.    cyclobenzaprine (FLEXERIL) 5 MG tablet Take 1 tablet (5 mg total) by mouth 3 (three) times daily as needed for muscle spasms.    glucose blood (ACCU-CHEK GUIDE) test strip 1 each by Other route as needed for other. Use as instructed once daily DX E11.65    metoprolol succinate (TOPROL-XL) 25 MG 24 hr tablet Take 1 tablet (25 mg total) by mouth daily.    montelukast (SINGULAIR) 10 MG tablet TAKE 1 TABLET BY MOUTH EVERY DAY    Multiple Vitamin (MULTI-VITAMINS) TABS Take 1 tablet by mouth 1 day or 1 dose. 05/16/2015: Received from: [facility]: Take by mouth.   pantoprazole (PROTONIX) 40 MG tablet TAKE 1 TABLET BY MOUTH EVERY DAY    rosuvastatin (CRESTOR) 20 MG tablet TAKE 1 TABLET BY MOUTH EVERY DAY    Semaglutide (RYBELSUS) 7  MG TABS Take 7 mg by mouth daily.    sildenafil (VIAGRA) 100 MG tablet Take 0.5-1 tablets (50-100 mg total) by mouth daily as needed for erectile dysfunction.    triamcinolone cream (KENALOG) 0.1 % Apply 1 application topically 3 (three) times daily. To affected areas until resolved.    [DISCONTINUED] hydrALAZINE (APRESOLINE) 25 MG tablet TAKE 1 TABLET (25 MG) BY MOUTH IN THE MORNING AND AT BEDTIME    [DISCONTINUED] Semaglutide (RYBELSUS) 3 MG TABS Take 3 mg by mouth daily.    hydrALAZINE (APRESOLINE) 25 MG tablet Take 1 tablet (25 mg total) by mouth 2 (two) times daily.    No facility-administered encounter medications on file as of 11/28/2022.    Surgical History: Past Surgical History:  Procedure Laterality Date   HERNIA REPAIR     umbilical   JOINT REPLACEMENT Bilateral    knees   KNEE SURGERY Bilateral    knee replacements   NASAL SEPTOPLASTY W/ TURBINOPLASTY Bilateral 10/17/2015   Procedure: NASAL SEPTOPLASTY WITH TURBINATE REDUCTION;  Surgeon: Beverly Gust, MD;  Location: [facility] ORS;  Service: ENT;  Laterality: Bilateral;   Cobre   broke neck   TONSILECTOMY, ADENOIDECTOMY, BILATERAL MYRINGOTOMY AND TUBES     TONSILLECTOMY      Medical History: Past Medical History:  Diagnosis Date   GERD (gastroesophageal reflux disease)    Hypertension     Family History: Family History  Problem  Relation Age of Onset   Stroke Mother    Heart disease Mother    Hypertension Mother    Liver disease Father    Hypertension Sister     Social History   Socioeconomic History   Marital status: Married    Spouse name: Not on file   Number of children: Not on file   Years of education: Not on file   Highest education level: Not on file  Occupational History   Occupation: med lab techinician     Employer: [employer]  Tobacco Use   Smoking status: Former    Years: 10.00    Types: Cigarettes    Start date: 12/03/1983    Quit date: 12/02/1993    Years  since quitting: 29.0   Smokeless tobacco: Never  Substance and Sexual Activity   Alcohol use: Yes    Alcohol/week: 4.0 standard drinks of alcohol    Types: 4 Cans of beer per week   Drug use: No   Sexual activity: Never  Other Topics Concern   Not on file  Social History Narrative   Not on file   Social Determinants of Health   Financial Resource Strain: Not on file  Food Insecurity: Not on file  Transportation Needs: Not on file  Physical Activity: Not on file  Stress: Not on file  Social Connections: Not on file  Intimate Partner Violence: Not on file      Review of Systems  Constitutional:  Negative for chills, fatigue and unexpected weight change.  HENT:  Negative for congestion, rhinorrhea, sneezing and sore throat.   Eyes:  Negative for redness.  Respiratory:  Negative for cough, chest tightness and shortness of breath.   Cardiovascular:  Negative for chest pain and palpitations.  Gastrointestinal:  Negative for abdominal pain, constipation, diarrhea, nausea and vomiting.  Genitourinary:  Negative for dysuria and frequency.  Musculoskeletal:  Positive for arthralgias. Negative for back pain, joint swelling and neck pain.  Skin:  Negative for rash.  Neurological: Negative.  Negative for tremors and numbness.  Hematological:  Negative for adenopathy. Does not bruise/bleed easily.  Psychiatric/Behavioral:  Negative for behavioral problems (Depression), sleep disturbance and suicidal ideas. The patient is not nervous/anxious.     Vital Signs: BP 138/78 Comment: 147/72  Pulse 94   Temp 98.2 F (36.8 C)   Resp 16   Ht 6' (1.829 m)   Wt (!) 326 lb 6.4 oz (148.1 kg)   SpO2 97%   BMI 44.27 kg/m    Physical Exam Vitals and nursing note reviewed.  Constitutional:      General: He is not in acute distress.    Appearance: Normal appearance. He is well-developed. He is obese. He is not diaphoretic.  HENT:     Head: Normocephalic and atraumatic.     Mouth/Throat:      Pharynx: No oropharyngeal exudate.  Eyes:     Pupils: Pupils are equal, round, and reactive to light.  Neck:     Thyroid: No thyromegaly.     Vascular: No JVD.     Trachea: No tracheal deviation.  Cardiovascular:     Rate and Rhythm: Normal rate and regular rhythm.     Heart sounds: Normal heart sounds. No murmur heard.    No friction rub. No gallop.  Pulmonary:     Effort: Pulmonary effort is normal. No respiratory distress.     Breath sounds: No wheezing or rales.  Chest:     Chest wall: No  tenderness.  Abdominal:     General: Bowel sounds are normal.     Palpations: Abdomen is soft.     Tenderness: There is no abdominal tenderness.  Musculoskeletal:        General: Normal range of motion.     Cervical back: Normal range of motion and neck supple.  Lymphadenopathy:     Cervical: No cervical adenopathy.  Skin:    General: Skin is warm and dry.  Neurological:     Mental Status: He is alert and oriented to person, place, and time.     Cranial Nerves: No cranial nerve deficit.  Psychiatric:        Behavior: Behavior normal.        Thought Content: Thought content normal.        Judgment: Judgment normal.        Assessment/Plan: 1. Type 2 diabetes mellitus with hyperglycemia, without long-term current use of insulin (HCC) Will increase to 30m daily and continue to work on diet and exercise - Semaglutide (RYBELSUS) 7 MG TABS; Take 7 mg by mouth daily.  Dispense: 30 tablet; Refill: 2  2. Essential hypertension Stable, continue current medications - hydrALAZINE (APRESOLINE) 25 MG tablet; Take 1 tablet (25 mg total) by mouth 2 (two) times daily.  Dispense: 180 tablet; Refill: 0  3. Morbid obesity with body mass index (BMI) of 40.0 to 44.9 in adult ([facility] Again discussed need for weight loss and to really start working on changing diet, drinking less beer and more water, and exercising. Will also increase rybelsus to aid BG and wt.   General Counseling: Lnuri larmerunderstanding of the findings of todays visit and agrees with plan of treatment. I have discussed any further diagnostic evaluation that may be needed or ordered today. We also reviewed his medications today. he has been encouraged to call the office with any questions or concerns that should arise related to todays visit.    No orders of the defined types were placed in this encounter.   Meds ordered this encounter  Medications   Semaglutide (RYBELSUS) 7 MG TABS    Sig: Take 7 mg by mouth daily.    Dispense:  30 tablet    Refill:  2   hydrALAZINE (APRESOLINE) 25 MG tablet    Sig: Take 1 tablet (25 mg total) by mouth 2 (two) times daily.    Dispense:  180 tablet    Refill:  0    This patient was seen by LDrema Dallas PA-C in collaboration with Dr. FClayborn Bignessas a part of collaborative care agreement.   Total time spent:30 Minutes Time spent includes review of chart, medications, test results, and follow up plan with the patient.      Dr FLavera GuiseInternal medicine

## 2022-12-27 ENCOUNTER — Ambulatory Visit: Payer: Medicare Other | Admitting: Physician Assistant

## 2023-01-08 ENCOUNTER — Other Ambulatory Visit: Payer: Self-pay | Admitting: Physician Assistant

## 2023-01-08 DIAGNOSIS — E785 Hyperlipidemia, unspecified: Secondary | ICD-10-CM

## 2023-02-06 ENCOUNTER — Other Ambulatory Visit: Payer: Self-pay | Admitting: Physician Assistant

## 2023-02-06 DIAGNOSIS — I10 Essential (primary) hypertension: Secondary | ICD-10-CM

## 2023-03-10 ENCOUNTER — Other Ambulatory Visit: Payer: Self-pay | Admitting: Internal Medicine

## 2023-03-10 DIAGNOSIS — K219 Gastro-esophageal reflux disease without esophagitis: Secondary | ICD-10-CM

## 2023-04-10 ENCOUNTER — Other Ambulatory Visit: Payer: Self-pay | Admitting: Physician Assistant

## 2023-04-10 DIAGNOSIS — J302 Other seasonal allergic rhinitis: Secondary | ICD-10-CM

## 2023-04-15 ENCOUNTER — Telehealth: Payer: Self-pay | Admitting: Physician Assistant

## 2023-04-15 NOTE — Telephone Encounter (Signed)
s/w patient to schedule diabetic eye exam here at office. He stated he has done at regular eye doctor-Shaun Patrick

## 2023-04-23 ENCOUNTER — Other Ambulatory Visit: Payer: Self-pay

## 2023-06-09 ENCOUNTER — Ambulatory Visit (INDEPENDENT_AMBULATORY_CARE_PROVIDER_SITE_OTHER): Payer: Medicare Other | Admitting: Physician Assistant

## 2023-06-09 ENCOUNTER — Encounter: Payer: Self-pay | Admitting: Physician Assistant

## 2023-06-09 VITALS — BP 144/94 | HR 90 | Temp 98.3°F | Resp 16 | Ht 72.0 in | Wt 322.2 lb

## 2023-06-09 DIAGNOSIS — M25512 Pain in left shoulder: Secondary | ICD-10-CM

## 2023-06-09 DIAGNOSIS — R7989 Other specified abnormal findings of blood chemistry: Secondary | ICD-10-CM

## 2023-06-09 DIAGNOSIS — G8929 Other chronic pain: Secondary | ICD-10-CM | POA: Diagnosis not present

## 2023-06-09 DIAGNOSIS — Z6841 Body Mass Index (BMI) 40.0 and over, adult: Secondary | ICD-10-CM

## 2023-06-09 DIAGNOSIS — M25552 Pain in left hip: Secondary | ICD-10-CM

## 2023-06-09 DIAGNOSIS — R5383 Other fatigue: Secondary | ICD-10-CM

## 2023-06-09 DIAGNOSIS — R7303 Prediabetes: Secondary | ICD-10-CM

## 2023-06-09 DIAGNOSIS — I10 Essential (primary) hypertension: Secondary | ICD-10-CM | POA: Diagnosis not present

## 2023-06-09 DIAGNOSIS — Z125 Encounter for screening for malignant neoplasm of prostate: Secondary | ICD-10-CM

## 2023-06-09 DIAGNOSIS — E782 Mixed hyperlipidemia: Secondary | ICD-10-CM

## 2023-06-09 MED ORDER — CYCLOBENZAPRINE HCL 10 MG PO TABS: 10.0000 mg | ORAL_TABLET | Freq: Every day | ORAL | 0 refills | Status: DC

## 2023-06-09 MED ORDER — MELOXICAM 15 MG PO TABS: 15.0000 mg | ORAL_TABLET | Freq: Every day | ORAL | 0 refills | Status: DC

## 2023-06-09 NOTE — Progress Notes (Signed)
[facility] [address] [place], [place] [address]  Internal MEDICINE  Office Visit Note  Patient Name: Shaun Patrick  [id number]  [id number]  Date of Service: 06/11/2023  Chief Complaint  Patient presents with   Acute Visit   Shoulder Pain   Hip Pain     HPI Pt is here for a sick visit. His wife if with him -Left shoulder pain, started about a week ago. Pain has been constant since then. No known injury and thinks it was hurting when he woke up, but did not wake him from sleep. Pain does not radiate. Pain is more so along back of shoulder. No neck pain.  -Sleeping on this side makes it feel worse -Taking NSAID, but it is not helping. Ice packs/heating--heat feels better. Using a topical he got from [employer].  -He continues to have full ROM and this does not increase his pain any. Would like to try medication first and if not improving then will refer to ortho for further evaluation -Does continue to have chronic left hip pain as well, but has been putting off further evaluation as he didn't want surgery. May want this evaluated if going for shoulder as well -will call for ortho referral If needed -Unfortunately he did miss his last follow up appt and never had blood work done as ordered. Will order new labs today to have done before CPE  Current Medication:  Outpatient Encounter Medications as of 06/09/2023  Medication Sig Note   Accu-Chek Softclix Lancets lancets Use as directed once a daily DX E11.65    aspirin 81 MG chewable tablet CHEW 1 TABLET DAILY    Blood Glucose Monitoring Suppl (ACCU-CHEK GUIDE ME) w/Device KIT USE AS DIRECTED TO CHECK GLUCOSE dxE11.65    clobetasol ointment (TEMOVATE) 0.05 % Apply 1 application topically 2 (two) times daily.    cyclobenzaprine (FLEXERIL) 10 MG tablet Take 1 tablet (10 mg total) by mouth at bedtime. May start with 1/2 tablet first night and increase to full tablet next night.    glucose blood (ACCU-CHEK GUIDE) test  strip 1 each by Other route as needed for other. Use as instructed once daily DX E11.65    hydrALAZINE (APRESOLINE) 25 MG tablet Take 1 tablet (25 mg total) by mouth 2 (two) times daily.    meloxicam (MOBIC) 15 MG tablet Take 1 tablet (15 mg total) by mouth daily.    metoprolol succinate (TOPROL-XL) 25 MG 24 hr tablet TAKE 1 TABLET (25 MG TOTAL) BY MOUTH DAILY.    montelukast (SINGULAIR) 10 MG tablet TAKE 1 TABLET BY MOUTH EVERY DAY    Multiple Vitamin (MULTI-VITAMINS) TABS Take 1 tablet by mouth 1 day or 1 dose. 05/16/2015: Received from: [facility] Received Sig: Take by mouth.   pantoprazole (PROTONIX) 40 MG tablet TAKE 1 TABLET BY MOUTH EVERY DAY    rosuvastatin (CRESTOR) 20 MG tablet TAKE 1 TABLET BY MOUTH EVERY DAY    Semaglutide (RYBELSUS) 7 MG TABS Take 7 mg by mouth daily.    sildenafil (VIAGRA) 100 MG tablet Take 0.5-1 tablets (50-100 mg total) by mouth daily as needed for erectile dysfunction.    triamcinolone cream (KENALOG) 0.1 % Apply 1 application topically 3 (three) times daily. To affected areas until resolved.    [DISCONTINUED] cyclobenzaprine (FLEXERIL) 5 MG tablet Take 1 tablet (5 mg total) by mouth 3 (three) times daily as needed for muscle spasms.    No facility-administered encounter medications on file as of  06/09/2023.      Medical History: Past Medical History:  Diagnosis Date   GERD (gastroesophageal reflux disease)    Hypertension      Vital Signs: BP (!) 144/94 Comment: 155/92  Pulse 90 Comment: 104  Temp 98.3 F (36.8 C)   Resp 16   Ht 6' (1.829 m)   Wt (!) 322 lb 3.2 oz (146.1 kg)   SpO2 97%   BMI 43.70 kg/m    Review of Systems  Constitutional:  Negative for fatigue and fever.  HENT:  Negative for congestion, mouth sores and postnasal drip.   Respiratory:  Negative for cough and shortness of breath.   Cardiovascular:  Negative for chest pain and palpitations.  Genitourinary:  Negative for flank pain.  Musculoskeletal:   Positive for arthralgias.       Left shoulder and hip pain  Psychiatric/Behavioral: Negative.      Physical Exam Vitals and nursing note reviewed.  Constitutional:      General: He is not in acute distress.    Appearance: Normal appearance. He is well-developed. He is obese. He is not diaphoretic.  HENT:     Head: Normocephalic and atraumatic.     Mouth/Throat:     Pharynx: No oropharyngeal exudate.  Eyes:     Pupils: Pupils are equal, round, and reactive to light.  Neck:     Thyroid: No thyromegaly.     Vascular: No JVD.     Trachea: No tracheal deviation.  Cardiovascular:     Rate and Rhythm: Normal rate and regular rhythm.     Heart sounds: Normal heart sounds. No murmur heard.    No friction rub. No gallop.  Pulmonary:     Effort: Pulmonary effort is normal.  Musculoskeletal:        General: No tenderness. Normal range of motion.     Cervical back: Normal range of motion and neck supple.     Comments: Pain in left shoulder at rest, not worsened with movement or palpation, but present throughout. -empty can and Hawkins tests  Lymphadenopathy:     Cervical: No cervical adenopathy.  Skin:    General: Skin is warm and dry.  Neurological:     Mental Status: He is alert and oriented to person, place, and time.     Cranial Nerves: No cranial nerve deficit.  Psychiatric:        Behavior: Behavior normal.        Thought Content: Thought content normal.        Judgment: Judgment normal.       Assessment/Plan: 1. Acute pain of left shoulder Will start on mobic in AM and flexeril as needed before bed. Avoid sleeping on this side. Will call if not improving and ortho referral will be placed - meloxicam (MOBIC) 15 MG tablet; Take 1 tablet (15 mg total) by mouth daily.  Dispense: 30 tablet; Refill: 0 - cyclobenzaprine (FLEXERIL) 10 MG tablet; Take 1 tablet (10 mg total) by mouth at bedtime. May start with 1/2 tablet first night and increase to full tablet next night.  Dispense:  30 tablet; Refill: 0  2. Chronic left hip pain Will try mobic, may need ortho referral  3. Essential hypertension Elevated in office likely due to pain and will continue to monitor  4. Prediabetes - Hgb A1C w/o eAG  5. Mixed hyperlipidemia - Lipid Panel With LDL/HDL Ratio  6. Abnormal thyroid blood test - TSH + free T4  7. Screening for prostate cancer -  PSA Total (Reflex To Free)  8. Other fatigue - CBC w/Diff/Platelet - Comprehensive metabolic panel - TSH + free T4 - Lipid Panel With LDL/HDL Ratio - PSA Total (Reflex To Free)  9. Morbid obesity with body mass index (BMI) of 40.0 to 44.9 in adult [facility]) Down 4lbs since last visit and encouraged to continue to work on this   General Counseling: Iziah verbalizes understanding of the findings of todays visit and agrees with plan of treatment. I have discussed any further diagnostic evaluation that may be needed or ordered today. We also reviewed his medications today. he has been encouraged to call the office with any questions or concerns that should arise related to todays visit.    Counseling:    Orders Placed This Encounter  Procedures   CBC w/Diff/Platelet   Comprehensive metabolic panel   TSH + free T4   Lipid Panel With LDL/HDL Ratio   PSA Total (Reflex To Free)   Hgb A1C w/o eAG    Meds ordered this encounter  Medications   meloxicam (MOBIC) 15 MG tablet    Sig: Take 1 tablet (15 mg total) by mouth daily.    Dispense:  30 tablet    Refill:  0   cyclobenzaprine (FLEXERIL) 10 MG tablet    Sig: Take 1 tablet (10 mg total) by mouth at bedtime. May start with 1/2 tablet first night and increase to full tablet next night.    Dispense:  30 tablet    Refill:  0    Time spent:30 Minutes

## 2023-06-19 DIAGNOSIS — B351 Tinea unguium: Secondary | ICD-10-CM | POA: Diagnosis not present

## 2023-06-19 DIAGNOSIS — M79674 Pain in right toe(s): Secondary | ICD-10-CM | POA: Diagnosis not present

## 2023-06-19 DIAGNOSIS — L600 Ingrowing nail: Secondary | ICD-10-CM | POA: Diagnosis not present

## 2023-06-19 DIAGNOSIS — M79675 Pain in left toe(s): Secondary | ICD-10-CM | POA: Diagnosis not present

## 2023-06-19 DIAGNOSIS — R7303 Prediabetes: Secondary | ICD-10-CM | POA: Diagnosis not present

## 2023-06-23 ENCOUNTER — Other Ambulatory Visit: Payer: Self-pay | Admitting: Physician Assistant

## 2023-06-23 DIAGNOSIS — I10 Essential (primary) hypertension: Secondary | ICD-10-CM

## 2023-06-23 DIAGNOSIS — M25512 Pain in left shoulder: Secondary | ICD-10-CM

## 2023-07-08 DIAGNOSIS — L03032 Cellulitis of left toe: Secondary | ICD-10-CM | POA: Diagnosis not present

## 2023-07-11 DIAGNOSIS — H2513 Age-related nuclear cataract, bilateral: Secondary | ICD-10-CM | POA: Diagnosis not present

## 2023-07-11 DIAGNOSIS — H401132 Primary open-angle glaucoma, bilateral, moderate stage: Secondary | ICD-10-CM | POA: Diagnosis not present

## 2023-07-11 DIAGNOSIS — E119 Type 2 diabetes mellitus without complications: Secondary | ICD-10-CM | POA: Diagnosis not present

## 2023-07-11 DIAGNOSIS — H43813 Vitreous degeneration, bilateral: Secondary | ICD-10-CM | POA: Diagnosis not present

## 2023-07-17 ENCOUNTER — Other Ambulatory Visit: Payer: Self-pay | Admitting: Physician Assistant

## 2023-07-17 DIAGNOSIS — E785 Hyperlipidemia, unspecified: Secondary | ICD-10-CM

## 2023-08-03 ENCOUNTER — Other Ambulatory Visit: Payer: Self-pay | Admitting: Physician Assistant

## 2023-08-03 DIAGNOSIS — M25512 Pain in left shoulder: Secondary | ICD-10-CM

## 2023-08-11 DIAGNOSIS — E119 Type 2 diabetes mellitus without complications: Secondary | ICD-10-CM | POA: Diagnosis not present

## 2023-08-11 DIAGNOSIS — H401132 Primary open-angle glaucoma, bilateral, moderate stage: Secondary | ICD-10-CM | POA: Diagnosis not present

## 2023-08-13 DIAGNOSIS — R5383 Other fatigue: Secondary | ICD-10-CM | POA: Diagnosis not present

## 2023-08-13 DIAGNOSIS — E782 Mixed hyperlipidemia: Secondary | ICD-10-CM | POA: Diagnosis not present

## 2023-08-13 DIAGNOSIS — R7989 Other specified abnormal findings of blood chemistry: Secondary | ICD-10-CM | POA: Diagnosis not present

## 2023-08-13 DIAGNOSIS — R7303 Prediabetes: Secondary | ICD-10-CM | POA: Diagnosis not present

## 2023-08-14 ENCOUNTER — Encounter: Payer: Self-pay | Admitting: Physician Assistant

## 2023-08-14 ENCOUNTER — Ambulatory Visit (INDEPENDENT_AMBULATORY_CARE_PROVIDER_SITE_OTHER): Payer: Medicare Other | Admitting: Physician Assistant

## 2023-08-14 VITALS — BP 140/90 | HR 96 | Temp 98.3°F | Resp 16 | Ht 72.0 in | Wt 337.0 lb

## 2023-08-14 DIAGNOSIS — Z23 Encounter for immunization: Secondary | ICD-10-CM

## 2023-08-14 DIAGNOSIS — I10 Essential (primary) hypertension: Secondary | ICD-10-CM | POA: Diagnosis not present

## 2023-08-14 DIAGNOSIS — R300 Dysuria: Secondary | ICD-10-CM

## 2023-08-14 DIAGNOSIS — R7303 Prediabetes: Secondary | ICD-10-CM | POA: Diagnosis not present

## 2023-08-14 DIAGNOSIS — Z0001 Encounter for general adult medical examination with abnormal findings: Secondary | ICD-10-CM

## 2023-08-14 DIAGNOSIS — E1165 Type 2 diabetes mellitus with hyperglycemia: Secondary | ICD-10-CM | POA: Diagnosis not present

## 2023-08-14 DIAGNOSIS — Z6841 Body Mass Index (BMI) 40.0 and over, adult: Secondary | ICD-10-CM

## 2023-08-14 DIAGNOSIS — K219 Gastro-esophageal reflux disease without esophagitis: Secondary | ICD-10-CM | POA: Diagnosis not present

## 2023-08-14 MED ORDER — HYDRALAZINE HCL 25 MG PO TABS: 25.0000 mg | ORAL_TABLET | Freq: Two times a day (BID) | ORAL | 1 refills | Status: DC

## 2023-08-14 MED ORDER — PANTOPRAZOLE SODIUM 40 MG PO TBEC
40.0000 mg | DELAYED_RELEASE_TABLET | Freq: Every day | ORAL | 1 refills | Status: DC
Start: 2023-08-14 — End: 2024-01-28

## 2023-08-14 MED ORDER — RYBELSUS 14 MG PO TABS
14.0000 mg | ORAL_TABLET | Freq: Every day | ORAL | 1 refills | Status: DC
Start: 2023-08-14 — End: 2024-02-26

## 2023-08-14 NOTE — Progress Notes (Signed)
[facility] [address] [place], [place] [address]  Internal MEDICINE  Office Visit Note  Patient Name: Shaun Patrick  [id number]  [id number]  Date of Service: 08/15/2023  Chief Complaint  Patient presents with   Medicare Wellness   Gastroesophageal Reflux   Hypertension    HPI [name] presents for an annual well visit and physical exam.  Well-appearing 67 y.o. male presents with his wife in office Routine CRC screening: Done 2022, cologuard due 2025 Eye exam and/or foot exam: saw podiatry recently, eye exam last month and goes back again q59months for glaucoma Labs: A1c worse at 7.8 now and uncontrolled, other labs look ok -has not complained of shoulder pain since last visit and is not taking mobic of flexeril anymore Other concerns: has gained 15lbs since last visit, stopped drinking beer recently, but not active -not drinking much soda, does eat a lot creamed potatoes -Discussed changing diet as well as really getting exercise to help wt and BG. Stressed the importance of this. -declines shingles and PNA vaccines -Has only been taking hydralazine once per day now and will go back to BID which may be why BP high on exam, though discussed wt also contributes to this     08/14/2023    2:07 PM 08/08/2022    2:02 PM  MMSE - Mini Mental State Exam  Orientation to time 5 5  Orientation to Place 5 5  Registration 3 3  Attention/ Calculation 5 5  Recall 3 3  Language- name 2 objects 2 2  Language- repeat 1 1  Language- follow 3 step command 3 3  Language- read & follow direction 1 1  Write a sentence 1 1  Copy design 1 1  Total score 30 30    Functional Status Survey: Is the patient deaf or have difficulty hearing?: No Does the patient have difficulty seeing, even when wearing glasses/contacts?: No Does the patient have difficulty concentrating, remembering, or making decisions?: No Does the patient have difficulty walking or climbing stairs?: No Does the  patient have difficulty dressing or bathing?: No Does the patient have difficulty doing errands alone such as visiting a doctor's office or shopping?: No     04/19/2022    9:45 AM 06/20/2022    2:46 PM 08/08/2022    2:02 PM 10/17/2022    2:50 PM 08/14/2023    2:06 PM  Fall Risk  Falls in the past year? 0 0 0 0 0       08/14/2023    2:06 PM  Depression screen PHQ 2/9  Decreased Interest 0  Down, Depressed, Hopeless 0  PHQ - 2 Score 0        No data to display            Current Medication: Outpatient Encounter Medications as of 08/14/2023  Medication Sig Note   Accu-Chek Softclix Lancets lancets Use as directed once a daily DX E11.65    aspirin 81 MG chewable tablet CHEW 1 TABLET DAILY    Blood Glucose Monitoring Suppl (ACCU-CHEK GUIDE ME) w/Device KIT USE AS DIRECTED TO CHECK GLUCOSE dxE11.65    clobetasol ointment (TEMOVATE) 0.05 % Apply 1 application topically 2 (two) times daily.    glucose blood (ACCU-CHEK GUIDE) test strip 1 each by Other route as needed for other. Use as instructed once daily DX E11.65    metoprolol succinate (TOPROL-XL) 25 MG 24 hr tablet TAKE 1 TABLET (25 MG TOTAL) BY MOUTH DAILY.    montelukast (  SINGULAIR) 10 MG tablet TAKE 1 TABLET BY MOUTH EVERY DAY    Multiple Vitamin (MULTI-VITAMINS) TABS Take 1 tablet by mouth 1 day or 1 dose. 05/16/2015: Received from: [facility] Received Sig: Take by mouth.   rosuvastatin (CRESTOR) 20 MG tablet TAKE 1 TABLET BY MOUTH EVERY DAY    Semaglutide (RYBELSUS) 14 MG TABS Take 1 tablet (14 mg total) by mouth daily.    sildenafil (VIAGRA) 100 MG tablet Take 0.5-1 tablets (50-100 mg total) by mouth daily as needed for erectile dysfunction.    triamcinolone cream (KENALOG) 0.1 % Apply 1 application topically 3 (three) times daily. To affected areas until resolved.    [DISCONTINUED] cyclobenzaprine (FLEXERIL) 10 MG tablet Take 1 tablet (10 mg total) by mouth at bedtime. May start with 1/2 tablet first  night and increase to full tablet next night.    [DISCONTINUED] hydrALAZINE (APRESOLINE) 25 MG tablet Take 1 tablet (25 mg total) by mouth 2 (two) times daily.    [DISCONTINUED] meloxicam (MOBIC) 15 MG tablet TAKE 1 TABLET (15 MG TOTAL) BY MOUTH DAILY.    [DISCONTINUED] pantoprazole (PROTONIX) 40 MG tablet TAKE 1 TABLET BY MOUTH EVERY DAY    [DISCONTINUED] Semaglutide (RYBELSUS) 7 MG TABS Take 7 mg by mouth daily.    hydrALAZINE (APRESOLINE) 25 MG tablet Take 1 tablet (25 mg total) by mouth 2 (two) times daily.    pantoprazole (PROTONIX) 40 MG tablet Take 1 tablet (40 mg total) by mouth daily.    [DISCONTINUED] meloxicam (MOBIC) 15 MG tablet Take 1 tablet (15 mg total) by mouth daily.    [DISCONTINUED] metoprolol succinate (TOPROL-XL) 25 MG 24 hr tablet TAKE 1 TABLET (25 MG TOTAL) BY MOUTH DAILY.    [DISCONTINUED] rosuvastatin (CRESTOR) 20 MG tablet TAKE 1 TABLET BY MOUTH EVERY DAY    No facility-administered encounter medications on file as of 08/14/2023.    Surgical History: Past Surgical History:  Procedure Laterality Date   HERNIA REPAIR     umbilical   JOINT REPLACEMENT Bilateral    knees   KNEE SURGERY Bilateral    knee replacements   NASAL SEPTOPLASTY W/ TURBINOPLASTY Bilateral 10/17/2015   Procedure: NASAL SEPTOPLASTY WITH TURBINATE REDUCTION;  Surgeon: Linus Salmons, MD;  Location: [facility] ORS;  Service: ENT;  Laterality: Bilateral;   NECK SURGERY  1979   broke neck   TONSILECTOMY, ADENOIDECTOMY, BILATERAL MYRINGOTOMY AND TUBES     TONSILLECTOMY      Medical History: Past Medical History:  Diagnosis Date   GERD (gastroesophageal reflux disease)    Hypertension     Family History: Family History  Problem Relation Age of Onset   Stroke Mother    Heart disease Mother    Hypertension Mother    Liver disease Father    Hypertension Sister     Social History   Socioeconomic History   Marital status: Married    Spouse name: Not on file   Number of children: Not on  file   Years of education: Not on file   Highest education level: Not on file  Occupational History   Occupation: med lab techinician     Employer: [employer]  Tobacco Use   Smoking status: Former    Current packs/day: 0.00    Types: Cigarettes    Start date: 12/03/1983    Quit date: 12/02/1993    Years since quitting: 29.7   Smokeless tobacco: Never  Substance and Sexual Activity   Alcohol use: Yes  Alcohol/week: 4.0 standard drinks of alcohol    Types: 4 Cans of beer per week   Drug use: No   Sexual activity: Never  Other Topics Concern   Not on file  Social History Narrative   Not on file   Social Determinants of Health   Financial Resource Strain: Not on file  Food Insecurity: Not on file  Transportation Needs: Not on file  Physical Activity: Not on file  Stress: Not on file  Social Connections: Not on file  Intimate Partner Violence: Not on file      Review of Systems  Constitutional:  Negative for chills, fatigue and unexpected weight change.  HENT:  Negative for congestion, rhinorrhea, sneezing and sore throat.   Eyes:  Negative for redness.  Respiratory:  Negative for cough, chest tightness and shortness of breath.   Cardiovascular:  Negative for chest pain and palpitations.  Gastrointestinal:  Negative for abdominal pain, constipation, diarrhea, nausea and vomiting.  Genitourinary:  Negative for dysuria and frequency.  Musculoskeletal:  Positive for arthralgias. Negative for back pain, joint swelling and neck pain.  Skin:  Negative for rash.  Neurological: Negative.  Negative for tremors and numbness.  Hematological:  Negative for adenopathy. Does not bruise/bleed easily.  Psychiatric/Behavioral:  Negative for behavioral problems (Depression), sleep disturbance and suicidal ideas. The patient is not nervous/anxious.     Vital Signs: BP (!) 140/90   Pulse 96   Temp 98.3 F (36.8 C)   Resp 16   Ht 6' (1.829 m)   Wt (!) 337 lb (152.9  kg)   SpO2 99%   BMI 45.71 kg/m    Physical Exam Vitals and nursing note reviewed.  Constitutional:      General: He is not in acute distress.    Appearance: Normal appearance. He is well-developed. He is obese. He is not diaphoretic.  HENT:     Head: Normocephalic and atraumatic.     Mouth/Throat:     Pharynx: No oropharyngeal exudate.  Eyes:     Pupils: Pupils are equal, round, and reactive to light.  Neck:     Thyroid: No thyromegaly.     Vascular: No JVD.     Trachea: No tracheal deviation.  Cardiovascular:     Rate and Rhythm: Normal rate and regular rhythm.     Heart sounds: Normal heart sounds. No murmur heard.    No friction rub. No gallop.  Pulmonary:     Effort: Pulmonary effort is normal. No respiratory distress.     Breath sounds: No wheezing or rales.  Chest:     Chest wall: No tenderness.  Abdominal:     General: Bowel sounds are normal.     Palpations: Abdomen is soft.     Tenderness: There is no abdominal tenderness.  Musculoskeletal:        General: Normal range of motion.     Cervical back: Normal range of motion and neck supple.  Lymphadenopathy:     Cervical: No cervical adenopathy.  Skin:    General: Skin is warm and dry.  Neurological:     Mental Status: He is alert and oriented to person, place, and time.     Cranial Nerves: No cranial nerve deficit.  Psychiatric:        Behavior: Behavior normal.        Thought Content: Thought content normal.        Judgment: Judgment normal.        Assessment/Plan: 1. Encounter for general  adult medical examination with abnormal findings CPE performed, labs reviewed, UTD on colon screening  2. Type 2 diabetes mellitus with hyperglycemia, without long-term current use of insulin (HCC) Will increase to 14mg  rybelsus and needs to work on diet and exercise as well - Urine Microalbumin w/creat. ratio - Semaglutide (RYBELSUS) 14 MG TABS; Take 1 tablet (14 mg total) by mouth daily.  Dispense: 90 tablet;  Refill: 1  3. Essential hypertension Discussed hydralazine dosing is BID not once daily and will adjust accordingly and monitor - hydrALAZINE (APRESOLINE) 25 MG tablet; Take 1 tablet (25 mg total) by mouth 2 (two) times daily.  Dispense: 180 tablet; Refill: 1  4. Gastroesophageal reflux disease without esophagitis - pantoprazole (PROTONIX) 40 MG tablet; Take 1 tablet (40 mg total) by mouth daily.  Dispense: 90 tablet; Refill: 1  5. Flu vaccine need - Flu vaccine, recombinant, trivalent, inj  6. Morbid obesity with BMI of 45.0-49.9, adult (HCC) Gained 15lbs since last visit and needs to work on diet and exercise. Will also increase rybelsus to help BG and wt  7. Dysuria - UA/M w/rflx Culture, Routine     General Counseling: Deion verbalizes understanding of the findings of todays visit and agrees with plan of treatment. I have discussed any further diagnostic evaluation that may be needed or ordered today. We also reviewed his medications today. he has been encouraged to call the office with any questions or concerns that should arise related to todays visit.    Orders Placed This Encounter  Procedures   Microscopic Examination   Flu vaccine, recombinant, trivalent, inj   UA/M w/rflx Culture, Routine   Urine Microalbumin w/creat. ratio    Meds ordered this encounter  Medications   Semaglutide (RYBELSUS) 14 MG TABS    Sig: Take 1 tablet (14 mg total) by mouth daily.    Dispense:  90 tablet    Refill:  1   hydrALAZINE (APRESOLINE) 25 MG tablet    Sig: Take 1 tablet (25 mg total) by mouth 2 (two) times daily.    Dispense:  180 tablet    Refill:  1   pantoprazole (PROTONIX) 40 MG tablet    Sig: Take 1 tablet (40 mg total) by mouth daily.    Dispense:  90 tablet    Refill:  1    Return in about 4 weeks (around 09/11/2023) for HTN.   Total time spent:35 Minutes Time spent includes review of chart, medications, test results, and follow up plan with the patient.   [place]  Controlled Substance Database was reviewed by me.  This patient was seen by Lynn Ito, PA-C in collaboration with Dr. Beverely Risen as a part of collaborative care agreement.  Lynn Ito, PA-C Internal medicine

## 2023-08-15 LAB — UA/M W/RFLX CULTURE, ROUTINE
Glucose, UA: NEGATIVE
Leukocytes,UA: NEGATIVE
Nitrite, UA: NEGATIVE
Protein,UA: NEGATIVE
RBC, UA: NEGATIVE
Specific Gravity, UA: >=1.03 — AB (ref 1.005–1.030)
Urobilinogen, Ur: 1 mg/dL (ref 0.2–1.0)
pH, UA: 5 (ref 5.0–7.5)

## 2023-08-15 LAB — MICROSCOPIC EXAMINATION
Bacteria, UA: NONE SEEN
Casts: NONE SEEN /LPF
RBC, Urine: NONE SEEN /HPF (ref 0–2)

## 2023-08-15 LAB — MICROALBUMIN / CREATININE URINE RATIO
Creatinine, Urine: 364.5 mg/dL
Microalb/Creat Ratio: 2 mg/g{creat} (ref 0–29)
Microalbumin, Urine: 7.6 ug/mL

## 2023-08-19 ENCOUNTER — Other Ambulatory Visit: Payer: Self-pay | Admitting: Physician Assistant

## 2023-08-19 DIAGNOSIS — E1165 Type 2 diabetes mellitus with hyperglycemia: Secondary | ICD-10-CM

## 2023-09-01 ENCOUNTER — Other Ambulatory Visit: Payer: Self-pay | Admitting: Physician Assistant

## 2023-09-01 DIAGNOSIS — M25512 Pain in left shoulder: Secondary | ICD-10-CM

## 2023-09-22 ENCOUNTER — Other Ambulatory Visit: Payer: Self-pay | Admitting: Physician Assistant

## 2023-09-22 ENCOUNTER — Other Ambulatory Visit: Payer: Self-pay

## 2023-09-22 DIAGNOSIS — J302 Other seasonal allergic rhinitis: Secondary | ICD-10-CM

## 2023-09-22 DIAGNOSIS — M25512 Pain in left shoulder: Secondary | ICD-10-CM

## 2023-09-22 MED ORDER — MONTELUKAST SODIUM 10 MG PO TABS: 10.0000 mg | ORAL_TABLET | Freq: Every day | ORAL | 1 refills | Status: DC

## 2023-09-25 ENCOUNTER — Encounter: Payer: Self-pay | Admitting: Physician Assistant

## 2023-09-25 ENCOUNTER — Ambulatory Visit: Payer: Medicare Other | Admitting: Physician Assistant

## 2023-09-25 VITALS — BP 155/90 | HR 108 | Temp 98.2°F | Resp 16 | Ht 72.0 in | Wt 328.0 lb

## 2023-09-25 DIAGNOSIS — I10 Essential (primary) hypertension: Secondary | ICD-10-CM | POA: Diagnosis not present

## 2023-09-25 DIAGNOSIS — Z6841 Body Mass Index (BMI) 40.0 and over, adult: Secondary | ICD-10-CM | POA: Diagnosis not present

## 2023-09-25 NOTE — Progress Notes (Signed)
[facility] [address] [place], [place] [address]  Internal MEDICINE  Office Visit Note  Patient Name: Shaun Patrick  [id number]  [id number]  Date of Service: 10/06/2023  Chief Complaint  Patient presents with   Follow-up   Gastroesophageal Reflux   Hypertension    HPI Pt is here for routine follow up -Pt is down 9lbs since last visit. Tolerating rybelsus. Still not exercising and again discussed this. Also admits to adding salt to foods which raises BP. He will eat fastfood at least once per week and discussed this has high salt and cholesterol content and to avoid this -150/94 on recheck -will start monitoring BP at home, if high then will increase hydralazine to TID. Will send message in the next week with BP readings.  Current Medication: Outpatient Encounter Medications as of 09/25/2023  Medication Sig Note   Accu-Chek Softclix Lancets lancets Use as directed once a daily DX E11.65    aspirin 81 MG chewable tablet CHEW 1 TABLET DAILY    Blood Glucose Monitoring Suppl (ACCU-CHEK GUIDE ME) w/Device KIT USE AS DIRECTED TO CHECK GLUCOSE dxE11.65    clobetasol ointment (TEMOVATE) 0.05 % Apply 1 application topically 2 (two) times daily.    glucose blood (ACCU-CHEK GUIDE) test strip 1 each by Other route as needed for other. Use as instructed once daily DX E11.65    hydrALAZINE (APRESOLINE) 25 MG tablet Take 1 tablet (25 mg total) by mouth 2 (two) times daily.    metoprolol succinate (TOPROL-XL) 25 MG 24 hr tablet TAKE 1 TABLET (25 MG TOTAL) BY MOUTH DAILY.    montelukast (SINGULAIR) 10 MG tablet Take 1 tablet (10 mg total) by mouth daily.    Multiple Vitamin (MULTI-VITAMINS) TABS Take 1 tablet by mouth 1 day or 1 dose. 05/16/2015: Received from: [facility] Received Sig: Take by mouth.   pantoprazole (PROTONIX) 40 MG tablet Take 1 tablet (40 mg total) by mouth daily.    rosuvastatin (CRESTOR) 20 MG tablet TAKE 1 TABLET BY MOUTH EVERY DAY     Semaglutide (RYBELSUS) 14 MG TABS Take 1 tablet (14 mg total) by mouth daily.    sildenafil (VIAGRA) 100 MG tablet Take 0.5-1 tablets (50-100 mg total) by mouth daily as needed for erectile dysfunction.    triamcinolone cream (KENALOG) 0.1 % Apply 1 application topically 3 (three) times daily. To affected areas until resolved.    No facility-administered encounter medications on file as of 09/25/2023.    Surgical History: Past Surgical History:  Procedure Laterality Date   HERNIA REPAIR     umbilical   JOINT REPLACEMENT Bilateral    knees   KNEE SURGERY Bilateral    knee replacements   NASAL SEPTOPLASTY W/ TURBINOPLASTY Bilateral 10/17/2015   Procedure: NASAL SEPTOPLASTY WITH TURBINATE REDUCTION;  Surgeon: Linus Salmons, MD;  Location: [facility] ORS;  Service: ENT;  Laterality: Bilateral;   NECK SURGERY  1979   broke neck   TONSILECTOMY, ADENOIDECTOMY, BILATERAL MYRINGOTOMY AND TUBES     TONSILLECTOMY      Medical History: Past Medical History:  Diagnosis Date   GERD (gastroesophageal reflux disease)    Hypertension     Family History: Family History  Problem Relation Age of Onset   Stroke Mother    Heart disease Mother    Hypertension Mother    Liver disease Father    Hypertension Sister     Social History   Socioeconomic History   Marital status: Married    Spouse  name: Not on file   Number of children: Not on file   Years of education: Not on file   Highest education level: Not on file  Occupational History   Occupation: med lab techinician     Employer: [employer]  Tobacco Use   Smoking status: Former    Current packs/day: 0.00    Types: Cigarettes    Start date: 12/03/1983    Quit date: 12/02/1993    Years since quitting: 29.8   Smokeless tobacco: Never  Substance and Sexual Activity   Alcohol use: Yes    Alcohol/week: 4.0 standard drinks of alcohol    Types: 4 Cans of beer per week   Drug use: No   Sexual activity: Never  Other  Topics Concern   Not on file  Social History Narrative   Not on file   Social Determinants of Health   Financial Resource Strain: Not on file  Food Insecurity: Not on file  Transportation Needs: Not on file  Physical Activity: Not on file  Stress: Not on file  Social Connections: Not on file  Intimate Partner Violence: Not on file      Review of Systems  Constitutional:  Negative for chills, fatigue and unexpected weight change.  HENT:  Negative for congestion, rhinorrhea, sneezing and sore throat.   Eyes:  Negative for redness.  Respiratory:  Negative for cough, chest tightness and shortness of breath.   Cardiovascular:  Negative for chest pain and palpitations.  Gastrointestinal:  Negative for abdominal pain, constipation, diarrhea, nausea and vomiting.  Genitourinary:  Negative for dysuria and frequency.  Musculoskeletal:  Positive for arthralgias. Negative for back pain, joint swelling and neck pain.  Skin:  Negative for rash.  Neurological: Negative.  Negative for tremors and numbness.  Hematological:  Negative for adenopathy. Does not bruise/bleed easily.  Psychiatric/Behavioral:  Negative for behavioral problems (Depression), sleep disturbance and suicidal ideas. The patient is not nervous/anxious.     Vital Signs: BP (!) 155/90   Pulse (!) 108   Temp 98.2 F (36.8 C)   Resp 16   Ht 6' (1.829 m)   Wt (!) 328 lb (148.8 kg)   SpO2 97%   BMI 44.48 kg/m    Physical Exam Vitals and nursing note reviewed.  Constitutional:      General: He is not in acute distress.    Appearance: Normal appearance. He is well-developed. He is obese. He is not diaphoretic.  HENT:     Head: Normocephalic and atraumatic.     Mouth/Throat:     Pharynx: No oropharyngeal exudate.  Eyes:     Pupils: Pupils are equal, round, and reactive to light.  Neck:     Thyroid: No thyromegaly.     Vascular: No JVD.     Trachea: No tracheal deviation.  Cardiovascular:     Rate and Rhythm:  Normal rate and regular rhythm.     Heart sounds: Normal heart sounds. No murmur heard.    No friction rub. No gallop.  Pulmonary:     Effort: Pulmonary effort is normal. No respiratory distress.     Breath sounds: No wheezing or rales.  Chest:     Chest wall: No tenderness.  Abdominal:     General: Bowel sounds are normal.     Palpations: Abdomen is soft.     Tenderness: There is no abdominal tenderness.  Musculoskeletal:        General: Normal range of motion.     Cervical  back: Normal range of motion and neck supple.  Lymphadenopathy:     Cervical: No cervical adenopathy.  Skin:    General: Skin is warm and dry.  Neurological:     Mental Status: He is alert and oriented to person, place, and time.     Cranial Nerves: No cranial nerve deficit.  Psychiatric:        Behavior: Behavior normal.        Thought Content: Thought content normal.        Judgment: Judgment normal.        Assessment/Plan: 1. Essential hypertension May increase hydralazine to TID if BP high at home and will monitor daily. Also advised to reduce salt intake  2. Morbid obesity with BMI of 40.0-44.9, adult (HCC) Sown 9lbs since last visit and encouraged to continue to work on this.   General Counseling: hagen bohorquez understanding of the findings of todays visit and agrees with plan of treatment. I have discussed any further diagnostic evaluation that may be needed or ordered today. We also reviewed his medications today. he has been encouraged to call the office with any questions or concerns that should arise related to todays visit.    No orders of the defined types were placed in this encounter.   No orders of the defined types were placed in this encounter.   This patient was seen by Lynn Ito, PA-C in collaboration with Dr. Beverely Risen as a part of collaborative care agreement.   Total time spent:30 Minutes Time spent includes review of chart, medications, test results, and  follow up plan with the patient.      Dr Lyndon Code Internal medicine

## 2023-10-02 ENCOUNTER — Telehealth: Payer: Self-pay

## 2023-10-02 NOTE — Telephone Encounter (Signed)
Patient's wife called to report patient is still having elevated BP. Per Leotis Shames, patient is to take Hydralazine 3x daily. Asked patient's wife to call back with an update to see if more adjustments need to be made.

## 2023-10-23 ENCOUNTER — Ambulatory Visit (INDEPENDENT_AMBULATORY_CARE_PROVIDER_SITE_OTHER): Payer: Medicare Other | Admitting: Physician Assistant

## 2023-10-23 ENCOUNTER — Encounter: Payer: Self-pay | Admitting: Physician Assistant

## 2023-10-23 VITALS — BP 136/88 | HR 90 | Temp 98.2°F | Resp 16 | Ht 72.0 in | Wt 327.0 lb

## 2023-10-23 DIAGNOSIS — M7989 Other specified soft tissue disorders: Secondary | ICD-10-CM | POA: Diagnosis not present

## 2023-10-23 DIAGNOSIS — I10 Essential (primary) hypertension: Secondary | ICD-10-CM | POA: Diagnosis not present

## 2023-10-23 MED ORDER — HYDROCHLOROTHIAZIDE 12.5 MG PO TABS
12.5000 mg | ORAL_TABLET | Freq: Every day | ORAL | 3 refills | Status: DC
Start: 2023-10-23 — End: 2024-01-30

## 2023-10-23 NOTE — Progress Notes (Signed)
[facility] [address] [place], [place] [address]  Internal MEDICINE  Office Visit Note  Patient Name: Shaun Patrick  [id number]  [id number]  Date of Service: 11/04/2023  Chief Complaint  Patient presents with   Follow-up   Gastroesophageal Reflux   Hypertension   Quality Metric Gaps    Cologuard    HPI Pt is here for routine follow up for HTN -BP improved, taking hydralazine TID, but having some LE swelling still. Discuss low dose diuretic, but pt made aware of potential cross reaction between this and flomax on his allergy list and will stop if any problems -not wearing compression stockings but has them and will start wearing. Also will try elevating legs. Reports swelling only occurs throughout the day and goes down over night -Tolerating rybelsus  Current Medication: Outpatient Encounter Medications as of 10/23/2023  Medication Sig Note   Accu-Chek Softclix Lancets lancets Use as directed once a daily DX E11.65    aspirin 81 MG chewable tablet CHEW 1 TABLET DAILY    Blood Glucose Monitoring Suppl (ACCU-CHEK GUIDE ME) w/Device KIT USE AS DIRECTED TO CHECK GLUCOSE dxE11.65    clobetasol ointment (TEMOVATE) 0.05 % Apply 1 application topically 2 (two) times daily.    glucose blood (ACCU-CHEK GUIDE) test strip 1 each by Other route as needed for other. Use as instructed once daily DX E11.65    hydrochlorothiazide (HYDRODIURIL) 12.5 MG tablet Take 1 tablet (12.5 mg total) by mouth daily.    metoprolol succinate (TOPROL-XL) 25 MG 24 hr tablet TAKE 1 TABLET (25 MG TOTAL) BY MOUTH DAILY.    montelukast (SINGULAIR) 10 MG tablet Take 1 tablet (10 mg total) by mouth daily.    Multiple Vitamin (MULTI-VITAMINS) TABS Take 1 tablet by mouth 1 day or 1 dose. 05/16/2015: Received from: [facility] Received Sig: Take by mouth.   pantoprazole (PROTONIX) 40 MG tablet Take 1 tablet (40 mg total) by mouth daily.    rosuvastatin (CRESTOR) 20 MG tablet TAKE 1  TABLET BY MOUTH EVERY DAY    Semaglutide (RYBELSUS) 14 MG TABS Take 1 tablet (14 mg total) by mouth daily.    sildenafil (VIAGRA) 100 MG tablet Take 0.5-1 tablets (50-100 mg total) by mouth daily as needed for erectile dysfunction.    triamcinolone cream (KENALOG) 0.1 % Apply 1 application topically 3 (three) times daily. To affected areas until resolved.    [DISCONTINUED] hydrALAZINE (APRESOLINE) 25 MG tablet Take 1 tablet (25 mg total) by mouth 2 (two) times daily.    No facility-administered encounter medications on file as of 10/23/2023.    Surgical History: Past Surgical History:  Procedure Laterality Date   HERNIA REPAIR     umbilical   JOINT REPLACEMENT Bilateral    knees   KNEE SURGERY Bilateral    knee replacements   NASAL SEPTOPLASTY W/ TURBINOPLASTY Bilateral 10/17/2015   Procedure: NASAL SEPTOPLASTY WITH TURBINATE REDUCTION;  Surgeon: Linus Salmons, MD;  Location: [facility] ORS;  Service: ENT;  Laterality: Bilateral;   NECK SURGERY  1979   broke neck   TONSILECTOMY, ADENOIDECTOMY, BILATERAL MYRINGOTOMY AND TUBES     TONSILLECTOMY      Medical History: Past Medical History:  Diagnosis Date   GERD (gastroesophageal reflux disease)    Hypertension     Family History: Family History  Problem Relation Age of Onset   Stroke Mother    Heart disease Mother    Hypertension Mother    Liver disease Father  Hypertension Sister     Social History   Socioeconomic History   Marital status: Married    Spouse name: Not on file   Number of children: Not on file   Years of education: Not on file   Highest education level: Not on file  Occupational History   Occupation: med lab techinician     Employer: [employer]  Tobacco Use   Smoking status: Former    Current packs/day: 0.00    Types: Cigarettes    Start date: 12/03/1983    Quit date: 12/02/1993    Years since quitting: 29.9   Smokeless tobacco: Never  Substance and Sexual Activity   Alcohol  use: Yes    Alcohol/week: 4.0 standard drinks of alcohol    Types: 4 Cans of beer per week   Drug use: No   Sexual activity: Never  Other Topics Concern   Not on file  Social History Narrative   Not on file   Social Determinants of Health   Financial Resource Strain: Not on file  Food Insecurity: Not on file  Transportation Needs: Not on file  Physical Activity: Not on file  Stress: Not on file  Social Connections: Not on file  Intimate Partner Violence: Not on file      Review of Systems  Constitutional:  Negative for chills, fatigue and unexpected weight change.  HENT:  Negative for congestion, rhinorrhea, sneezing and sore throat.   Eyes:  Negative for redness.  Respiratory:  Negative for cough, chest tightness and shortness of breath.   Cardiovascular:  Negative for chest pain and palpitations.  Gastrointestinal:  Negative for abdominal pain, constipation, diarrhea, nausea and vomiting.  Genitourinary:  Negative for dysuria and frequency.  Musculoskeletal:  Positive for arthralgias. Negative for back pain, joint swelling and neck pain.  Skin:  Negative for rash.  Neurological: Negative.  Negative for tremors and numbness.  Hematological:  Negative for adenopathy. Does not bruise/bleed easily.  Psychiatric/Behavioral:  Negative for behavioral problems (Depression), sleep disturbance and suicidal ideas. The patient is not nervous/anxious.     Vital Signs: BP 136/88 Comment: 140/90  Pulse 90 Comment: 104  Temp 98.2 F (36.8 C)   Resp 16   Ht 6' (1.829 m)   Wt (!) 327 lb (148.3 kg)   SpO2 98%   BMI 44.35 kg/m    Physical Exam Vitals and nursing note reviewed.  Constitutional:      General: He is not in acute distress.    Appearance: Normal appearance. He is well-developed. He is obese. He is not diaphoretic.  HENT:     Head: Normocephalic and atraumatic.     Mouth/Throat:     Pharynx: No oropharyngeal exudate.  Eyes:     Pupils: Pupils are equal, round,  and reactive to light.  Neck:     Thyroid: No thyromegaly.     Vascular: No JVD.     Trachea: No tracheal deviation.  Cardiovascular:     Rate and Rhythm: Normal rate and regular rhythm.     Heart sounds: Normal heart sounds. No murmur heard.    No friction rub. No gallop.  Pulmonary:     Effort: Pulmonary effort is normal. No respiratory distress.     Breath sounds: No wheezing or rales.  Chest:     Chest wall: No tenderness.  Abdominal:     General: Bowel sounds are normal.     Palpations: Abdomen is soft.     Tenderness: There is no  abdominal tenderness.  Musculoskeletal:        General: Normal range of motion.     Cervical back: Normal range of motion and neck supple.     Right lower leg: Edema present.     Left lower leg: Edema present.  Lymphadenopathy:     Cervical: No cervical adenopathy.  Skin:    General: Skin is warm and dry.  Neurological:     Mental Status: He is alert and oriented to person, place, and time.     Cranial Nerves: No cranial nerve deficit.  Psychiatric:        Behavior: Behavior normal.        Thought Content: Thought content normal.        Judgment: Judgment normal.        Assessment/Plan: 1. Essential hypertension Improving, but will add hydrochlorothiazide to help further and to help with swelling. Monitor BP - hydrochlorothiazide (HYDRODIURIL) 12.5 MG tablet; Take 1 tablet (12.5 mg total) by mouth daily.  Dispense: 30 tablet; Refill: 3  2. Localized swelling of both lower extremities Will start elevating legs and utilizing compression stockings. May start hydrochlorothiazide but pt aware of potential cross reactivity with a medication on his allergy list and he is ok with proceeding and will call if any problems tolerating - hydrochlorothiazide (HYDRODIURIL) 12.5 MG tablet; Take 1 tablet (12.5 mg total) by mouth daily.  Dispense: 30 tablet; Refill: 3  3. Morbid obesity with BMI of 40.0-44.9, adult (HCC) Continue to work on diet and  exercise   General Counseling: Kabeer verbalizes understanding of the findings of todays visit and agrees with plan of treatment. I have discussed any further diagnostic evaluation that may be needed or ordered today. We also reviewed his medications today. he has been encouraged to call the office with any questions or concerns that should arise related to todays visit.    No orders of the defined types were placed in this encounter.   Meds ordered this encounter  Medications   hydrochlorothiazide (HYDRODIURIL) 12.5 MG tablet    Sig: Take 1 tablet (12.5 mg total) by mouth daily.    Dispense:  30 tablet    Refill:  3    This patient was seen by Lynn Ito, PA-C in collaboration with Dr. Beverely Risen as a part of collaborative care agreement.   Total time spent:30 Minutes Time spent includes review of chart, medications, test results, and follow up plan with the patient.      Dr Lyndon Code Internal medicine

## 2023-11-03 ENCOUNTER — Other Ambulatory Visit: Payer: Self-pay | Admitting: Physician Assistant

## 2023-11-03 DIAGNOSIS — M7989 Other specified soft tissue disorders: Secondary | ICD-10-CM

## 2023-11-03 DIAGNOSIS — I10 Essential (primary) hypertension: Secondary | ICD-10-CM

## 2023-11-03 MED ORDER — HYDRALAZINE HCL 25 MG PO TABS: 25.0000 mg | ORAL_TABLET | Freq: Two times a day (BID) | ORAL | 1 refills | Status: DC

## 2023-11-12 ENCOUNTER — Telehealth: Payer: Self-pay

## 2023-11-12 DIAGNOSIS — M79675 Pain in left toe(s): Secondary | ICD-10-CM | POA: Diagnosis not present

## 2023-11-12 DIAGNOSIS — B351 Tinea unguium: Secondary | ICD-10-CM | POA: Diagnosis not present

## 2023-11-12 DIAGNOSIS — M79674 Pain in right toe(s): Secondary | ICD-10-CM | POA: Diagnosis not present

## 2023-11-12 NOTE — Telephone Encounter (Addendum)
[facility] Med Adherence Report: Spoke with patient about their Rybelsus 14mg . Patient is taking prescribed amount as directed. Stressed the importance of medication adherence to the patient. Patient confirmed that they have enough and has just picked up their refill.

## 2023-11-23 ENCOUNTER — Other Ambulatory Visit: Payer: Self-pay | Admitting: Physician Assistant

## 2023-11-23 DIAGNOSIS — E785 Hyperlipidemia, unspecified: Secondary | ICD-10-CM

## 2023-12-17 DIAGNOSIS — L600 Ingrowing nail: Secondary | ICD-10-CM | POA: Diagnosis not present

## 2023-12-17 DIAGNOSIS — M79675 Pain in left toe(s): Secondary | ICD-10-CM | POA: Diagnosis not present

## 2023-12-17 DIAGNOSIS — B351 Tinea unguium: Secondary | ICD-10-CM | POA: Diagnosis not present

## 2023-12-17 DIAGNOSIS — M79674 Pain in right toe(s): Secondary | ICD-10-CM | POA: Diagnosis not present

## 2023-12-25 ENCOUNTER — Ambulatory Visit (INDEPENDENT_AMBULATORY_CARE_PROVIDER_SITE_OTHER): Payer: Medicare Other | Admitting: Physician Assistant

## 2023-12-25 ENCOUNTER — Encounter: Payer: Self-pay | Admitting: Physician Assistant

## 2023-12-25 VITALS — BP 136/86 | HR 84 | Temp 97.9°F | Resp 16 | Ht 72.0 in | Wt 321.8 lb

## 2023-12-25 DIAGNOSIS — I10 Essential (primary) hypertension: Secondary | ICD-10-CM

## 2023-12-25 DIAGNOSIS — E1165 Type 2 diabetes mellitus with hyperglycemia: Secondary | ICD-10-CM

## 2023-12-25 DIAGNOSIS — Z6841 Body Mass Index (BMI) 40.0 and over, adult: Secondary | ICD-10-CM | POA: Diagnosis not present

## 2023-12-25 MED ORDER — METFORMIN HCL ER 500 MG PO TB24: 500.0000 mg | ORAL_TABLET | Freq: Every day | ORAL | 2 refills | Status: DC

## 2023-12-25 NOTE — Progress Notes (Signed)
[facility] [address] [place], [place] [address]  Internal MEDICINE  Office Visit Note  Patient Name: Shaun Patrick  [id number]  [id number]  Date of Service: 12/31/2023  Chief Complaint  Patient presents with   Follow-up   Gastroesophageal Reflux   Hypertension   Quality Metric Gaps    Needs Cologuard    HPI Pt is here for routine follow up -BP at home not checked recently -Taking hydralazine BID, but added hydrochlorothiazide and seems to have helped swelling and BP -down 6lbs since last visit -unfortunately rybelsus is not going to be affordable now, will send metformin instead  Current Medication: Outpatient Encounter Medications as of 12/25/2023  Medication Sig Note   Accu-Chek Softclix Lancets lancets Use as directed once a daily DX E11.65    aspirin 81 MG chewable tablet CHEW 1 TABLET DAILY    Blood Glucose Monitoring Suppl (ACCU-CHEK GUIDE ME) w/Device KIT USE AS DIRECTED TO CHECK GLUCOSE dxE11.65    clobetasol ointment (TEMOVATE) 0.05 % Apply 1 application topically 2 (two) times daily.    glucose blood (ACCU-CHEK GUIDE) test strip 1 each by Other route as needed for other. Use as instructed once daily DX E11.65    hydrALAZINE (APRESOLINE) 25 MG tablet Take 1 tablet (25 mg total) by mouth 2 (two) times daily.    hydrochlorothiazide (HYDRODIURIL) 12.5 MG tablet Take 1 tablet (12.5 mg total) by mouth daily.    metFORMIN (GLUCOPHAGE-XR) 500 MG 24 hr tablet Take 1 tablet (500 mg total) by mouth daily with breakfast.    metoprolol succinate (TOPROL-XL) 25 MG 24 hr tablet TAKE 1 TABLET (25 MG TOTAL) BY MOUTH DAILY.    montelukast (SINGULAIR) 10 MG tablet Take 1 tablet (10 mg total) by mouth daily.    Multiple Vitamin (MULTI-VITAMINS) TABS Take 1 tablet by mouth 1 day or 1 dose. 05/16/2015: Received from: [facility] Received Sig: Take by mouth.   pantoprazole (PROTONIX) 40 MG tablet Take 1 tablet (40 mg total) by mouth daily.    rosuvastatin  (CRESTOR) 20 MG tablet TAKE 1 TABLET BY MOUTH EVERY DAY    Semaglutide (RYBELSUS) 14 MG TABS Take 1 tablet (14 mg total) by mouth daily.    sildenafil (VIAGRA) 100 MG tablet Take 0.5-1 tablets (50-100 mg total) by mouth daily as needed for erectile dysfunction.    triamcinolone cream (KENALOG) 0.1 % Apply 1 application topically 3 (three) times daily. To affected areas until resolved.    No facility-administered encounter medications on file as of 12/25/2023.    Surgical History: Past Surgical History:  Procedure Laterality Date   HERNIA REPAIR     umbilical   JOINT REPLACEMENT Bilateral    knees   KNEE SURGERY Bilateral    knee replacements   NASAL SEPTOPLASTY W/ TURBINOPLASTY Bilateral 10/17/2015   Procedure: NASAL SEPTOPLASTY WITH TURBINATE REDUCTION;  Surgeon: Linus Salmons, MD;  Location: [facility] ORS;  Service: ENT;  Laterality: Bilateral;   NECK SURGERY  1979   broke neck   TONSILECTOMY, ADENOIDECTOMY, BILATERAL MYRINGOTOMY AND TUBES     TONSILLECTOMY      Medical History: Past Medical History:  Diagnosis Date   GERD (gastroesophageal reflux disease)    Hypertension     Family History: Family History  Problem Relation Age of Onset   Stroke Mother    Heart disease Mother    Hypertension Mother    Liver disease Father    Hypertension Sister     Social History  Socioeconomic History   Marital status: Married    Spouse name: Not on file   Number of children: Not on file   Years of education: Not on file   Highest education level: Not on file  Occupational History   Occupation: med lab techinician     Employer: [employer]  Tobacco Use   Smoking status: Former    Current packs/day: 0.00    Types: Cigarettes    Start date: 12/03/1983    Quit date: 12/02/1993    Years since quitting: 30.0   Smokeless tobacco: Never  Substance and Sexual Activity   Alcohol use: Yes    Alcohol/week: 4.0 standard drinks of alcohol    Types: 4 Cans of beer per  week   Drug use: No   Sexual activity: Never  Other Topics Concern   Not on file  Social History Narrative   Not on file   Social Drivers of Health   Financial Resource Strain: Low Risk  (11/12/2023)   Received from [facility]   Overall Financial Resource Strain (CARDIA)    Difficulty of Paying Living Expenses: Not hard at all  Food Insecurity: No Food Insecurity (11/12/2023)   Received from [facility]   Hunger Vital Sign    Worried About Running Out of Food in the Last Year: Never true    Ran Out of Food in the Last Year: Never true  Transportation Needs: No Transportation Needs (11/12/2023)   Received from [facility] - Transportation    In the past 12 months, has lack of transportation kept you from medical appointments or from getting medications?: No    Lack of Transportation (Non-Medical): No  Physical Activity: Not on file  Stress: Not on file  Social Connections: Not on file  Intimate Partner Violence: Not on file      Review of Systems  Constitutional:  Negative for chills, fatigue and unexpected weight change.  HENT:  Negative for congestion, rhinorrhea, sneezing and sore throat.   Eyes:  Negative for redness.  Respiratory:  Negative for cough, chest tightness and shortness of breath.   Cardiovascular:  Negative for chest pain and palpitations.  Gastrointestinal:  Negative for abdominal pain, constipation, diarrhea, nausea and vomiting.  Genitourinary:  Negative for dysuria and frequency.  Musculoskeletal:  Positive for arthralgias. Negative for back pain, joint swelling and neck pain.  Skin:  Negative for rash.  Neurological: Negative.  Negative for tremors and numbness.  Hematological:  Negative for adenopathy. Does not bruise/bleed easily.  Psychiatric/Behavioral:  Negative for behavioral problems (Depression), sleep disturbance and suicidal ideas. The patient is not nervous/anxious.      Vital Signs: BP 136/86 Comment: 150/90  Pulse 84   Temp 97.9 F (36.6 C)   Resp 16   Ht 6' (1.829 m)   Wt (!) 321 lb 12.8 oz (146 kg)   SpO2 99%   BMI 43.64 kg/m    Physical Exam Vitals and nursing note reviewed.  Constitutional:      General: He is not in acute distress.    Appearance: Normal appearance. He is well-developed. He is obese. He is not diaphoretic.  HENT:     Head: Normocephalic and atraumatic.     Mouth/Throat:     Pharynx: No oropharyngeal exudate.  Eyes:     Pupils: Pupils are equal, round, and reactive to light.  Neck:     Thyroid: No thyromegaly.     Vascular:  No JVD.     Trachea: No tracheal deviation.  Cardiovascular:     Rate and Rhythm: Normal rate and regular rhythm.     Heart sounds: Normal heart sounds. No murmur heard.    No friction rub. No gallop.  Pulmonary:     Effort: Pulmonary effort is normal. No respiratory distress.     Breath sounds: No wheezing or rales.  Chest:     Chest wall: No tenderness.  Abdominal:     General: Bowel sounds are normal.     Palpations: Abdomen is soft.     Tenderness: There is no abdominal tenderness.  Musculoskeletal:        General: Normal range of motion.     Cervical back: Normal range of motion and neck supple.  Lymphadenopathy:     Cervical: No cervical adenopathy.  Skin:    General: Skin is warm and dry.  Neurological:     Mental Status: He is alert and oriented to person, place, and time.     Cranial Nerves: No cranial nerve deficit.  Psychiatric:        Behavior: Behavior normal.        Thought Content: Thought content normal.        Judgment: Judgment normal.        Assessment/Plan: 1. Essential hypertension (Primary) Improved, continue hydralazine BID and hydrochlorothiazide daily. May need to titrate if rising again. Advised to monitor  2. Type 2 diabetes mellitus with hyperglycemia, without long-term current use of insulin (HCC) Will send metformin as more affordable  option - metFORMIN (GLUCOPHAGE-XR) 500 MG 24 hr tablet; Take 1 tablet (500 mg total) by mouth daily with breakfast.  Dispense: 30 tablet; Refill: 2  3. Morbid obesity with BMI of 40.0-44.9, adult (HCC) Down 6lbs, encouraged to continue working on this   General Counseling: Shaun Patrick verbalizes understanding of the findings of todays visit and agrees with plan of treatment. I have discussed any further diagnostic evaluation that may be needed or ordered today. We also reviewed his medications today. he has been encouraged to call the office with any questions or concerns that should arise related to todays visit.    No orders of the defined types were placed in this encounter.   Meds ordered this encounter  Medications   metFORMIN (GLUCOPHAGE-XR) 500 MG 24 hr tablet    Sig: Take 1 tablet (500 mg total) by mouth daily with breakfast.    Dispense:  30 tablet    Refill:  2    This patient was seen by Lynn Ito, PA-C in collaboration with Dr. Beverely Risen as a part of collaborative care agreement.   Total time spent:30 Minutes Time spent includes review of chart, medications, test results, and follow up plan with the patient.      Dr Lyndon Code Internal medicine

## 2024-01-21 ENCOUNTER — Other Ambulatory Visit: Payer: Self-pay

## 2024-01-21 DIAGNOSIS — Z79899 Other long term (current) drug therapy: Secondary | ICD-10-CM

## 2024-01-21 NOTE — Progress Notes (Signed)
 01/21/2024 Name: Shaun Patrick. MRN: [id number] DOB: [birth year]  Chief Complaint  Patient presents with   Medication Management   Diabetes    Shaun Patrick. is a 68 y.o. year old male who presented for a telephone visit.   They were referred to the pharmacist by  [employer]  for assistance in managing diabetes.    Subjective:  Care Team: Primary Care Provider: Alan Ripper ; Next Scheduled Visit: 02/26/2024   Medication Access/Adherence  Current Pharmacy:  [facility] [phone] [place] Rough, [place] - [address] [place] [address] Phone: [phone] Fax: [phone]   Patient reports affordability concerns with their medications: No  Patient reports access/transportation concerns to their pharmacy: No  Patient reports adherence concerns with their medications:  No  Patient does not forget to take medications   Diabetes:  Current medications: metformin Medications tried in the past: Rybelsus (cost)  Current glucose readings:  Fasting BG: 130,120  2hr-post prandial blood glucose: N/A  Uses glucometer, lancets, test strips meter; testing 1 times daily (shares diabetic test supplies and meter with wife)  Patient denies hypoglycemic s/sx including dizziness, shakiness, sweating. Patient denies hyperglycemic symptoms including polyuria, polydipsia, polyphagia, nocturia, neuropathy, blurred vision.  Current meal patterns:  - Breakfast: McDonald, Mindi Slicker, eggs, sausage - Lunch skip - Supper steak and gravy, potatoes, peas - Snacks apples, orange, peaches - Drinks: ginger ale (regular), green tea (diet), [employer], doesn't drink water, Kool aid, unsweetened tea  Current physical activity: seated bike; exercise bike daily watching TV   -He used to walk daily with his friends but stopped after his knee replacement  Current medication access support: N/A   Objective:  Lab Results  Component Value Date   HGBA1C 7.8 (H)  08/13/2023    Lab Results  Component Value Date   CREATININE 0.89 08/13/2023   BUN 10 08/13/2023   NA 141 08/13/2023   K 4.5 08/13/2023   CL 102 08/13/2023   CO2 23 08/13/2023    Lab Results  Component Value Date   CHOL 130 08/13/2023   HDL 34 (L) 08/13/2023   LDLCALC 70 08/13/2023   TRIG 147 08/13/2023   CHOLHDL 4.5 09/17/2019    Medications Reviewed Today     Reviewed by Katha Cabal, RPH (Pharmacist) on 01/21/24 at 1047  Med List Status: <None>   Medication Order Taking? Sig Documenting Provider Last Dose Status Informant  Accu-Chek Softclix Lancets lancets [id number] No Use as directed once a daily DX E11.65  Patient not taking: Reported on 01/21/2024   Carlean Jews, PA-C Not Taking Active            Med Note Katha Cabal   Wed Jan 21, 2024 10:43 AM) Uses spouse glucometer, lancets, test strips  aspirin 81 MG chewable tablet [id number] Yes CHEW 1 TABLET DAILY Smitty Cords, DO Taking Active   Blood Glucose Monitoring Suppl (ACCU-CHEK GUIDE ME) w/Device KIT [id number] No USE AS DIRECTED TO CHECK GLUCOSE dxE11.65  Patient not taking: Reported on 01/21/2024   Carlean Jews, PA-C Not Taking Active            Med Note Katha Cabal   Wed Jan 21, 2024 10:43 AM) Uses spouse glucometer, lancets, test strips  clobetasol ointment (TEMOVATE) 0.05 % [id number] No Apply 1 application topically 2 (two) times daily.  Patient not taking: Reported on 01/21/2024   Sallyanne Kuster, NP Not Taking Active   glucose blood (  ACCU-CHEK GUIDE) test strip [id number] No 1 each by Other route as needed for other. Use as instructed once daily DX E11.65  Patient not taking: Reported on 01/21/2024   Carlean Jews, PA-C Not Taking Active            Med Note Para March, [facility] R   Wed Jan 21, 2024 10:44 AM) Uses spouse glucometer, lancets, test strips  hydrALAZINE (APRESOLINE) 25 MG tablet [id number] Yes Take 1 tablet (25 mg total) by mouth 2 (two) times daily.  McDonough, Salomon Fick, PA-C Taking Active   hydrochlorothiazide (HYDRODIURIL) 12.5 MG tablet [id number] Yes Take 1 tablet (12.5 mg total) by mouth daily. McDonough, Salomon Fick, PA-C Taking Active   latanoprost (XALATAN) 0.005 % ophthalmic solution [id number] Yes 1 drop at bedtime. [provider] Taking Active   metFORMIN (GLUCOPHAGE-XR) 500 MG 24 hr tablet [id number] Yes Take 1 tablet (500 mg total) by mouth daily with breakfast. McDonough, Salomon Fick, PA-C Taking Active   metoprolol succinate (TOPROL-XL) 25 MG 24 hr tablet [id number] Yes TAKE 1 TABLET (25 MG TOTAL) BY MOUTH DAILY. McDonough, Salomon Fick, PA-C Taking Active   montelukast (SINGULAIR) 10 MG tablet [id number] Yes Take 1 tablet (10 mg total) by mouth daily. McDonough, Salomon Fick, PA-C Taking Active   Multiple Vitamin (MULTI-VITAMINS) TABS [id number] Yes Take 1 tablet by mouth 1 day or 1 dose. [provider] Taking Active            Med Note Luiz Blare, JAMIE C   Tue May 16, 2015 10:40 AM) Received from: [facility] Received Sig: Take by mouth.  pantoprazole (PROTONIX) 40 MG tablet [id number] Yes Take 1 tablet (40 mg total) by mouth daily. McDonough, Salomon Fick, PA-C Taking Active   rosuvastatin (CRESTOR) 20 MG tablet [id number] Yes TAKE 1 TABLET BY MOUTH EVERY DAY McDonough, Lauren K, PA-C Taking Active   Semaglutide (RYBELSUS) 14 MG TABS [id number] No Take 1 tablet (14 mg total) by mouth daily.  Patient not taking: Reported on 01/21/2024   Carlean Jews, PA-C Not Taking Active            Med Note Para March, [facility] R   Wed Jan 21, 2024 10:46 AM) Due to cost  sildenafil (VIAGRA) 100 MG tablet [id number] No Take 0.5-1 tablets (50-100 mg total) by mouth daily as needed for erectile dysfunction.  Patient not taking: Reported on 01/21/2024   Carlean Jews, NP Not Taking Active             Assessment/Plan:   Diabetes: - Currently uncontrolled. Goal A1c < 7%, fasting blood glucose <130, 2-hr post prandial <180.   Last LDL 70. Mr. Settlemire is not on max dose of metformin. Patient was previously on Rybelsus, but due to coverage/cost patient switched to metformin. Per patient reported income, he meets financial criteria for medication assistance for Rybelsus with [employer]. However, he declined patient assistance as he does not want to come to the clinic to pick up medication if he doesn't "need" to be there. Denied transportation issues. There are no other oral GLP1 alternatives, there are SGLT2 inhibitors in which the manufacturer ships to patient's home. Will plan to discuss alternative during future encounters and with PCP. Patient also shares his diabetic testing supplies and glucometer with his wife and was unwilling to stop. Discussed my concerns with patient about sharing supplies.  - Reviewed long term cardiovascular and renal outcomes of uncontrolled blood sugar - Reviewed goal A1c, goal fasting, and goal 2 hour  post prandial glucose - Reviewed dietary modifications including reduce consumption of fast foods for breakfast and sugary breakfast - Reviewed lifestyle modifications including: resuming walking with group/acquaintances  - Recommend to at least use his own testing supplies to adequately check blood glucose. May consider prescription for diabetic testing supplies for the glucometer he shares with wife since he is unwilling to use his own glucometer. Recommend testing blood glucose twice daily (fasting and prandial).  - Continue metformin 500 mg once daily as there were not enough reported blood glucose home readings to suggest a dose change.  - Patient is due for another hemoglobin A1c check     Follow Up Plan: 4-6 weeks  Total time spent: 33 minutes Cephus Shelling, PharmD Clinical Pharmacist Cell: [phone]

## 2024-01-28 ENCOUNTER — Other Ambulatory Visit: Payer: Self-pay | Admitting: Physician Assistant

## 2024-01-28 DIAGNOSIS — I10 Essential (primary) hypertension: Secondary | ICD-10-CM

## 2024-01-28 DIAGNOSIS — K219 Gastro-esophageal reflux disease without esophagitis: Secondary | ICD-10-CM

## 2024-01-28 DIAGNOSIS — J302 Other seasonal allergic rhinitis: Secondary | ICD-10-CM

## 2024-01-30 ENCOUNTER — Other Ambulatory Visit: Payer: Self-pay | Admitting: Physician Assistant

## 2024-01-30 DIAGNOSIS — E1165 Type 2 diabetes mellitus with hyperglycemia: Secondary | ICD-10-CM

## 2024-01-30 DIAGNOSIS — M7989 Other specified soft tissue disorders: Secondary | ICD-10-CM

## 2024-01-30 DIAGNOSIS — I10 Essential (primary) hypertension: Secondary | ICD-10-CM

## 2024-02-09 DIAGNOSIS — H43813 Vitreous degeneration, bilateral: Secondary | ICD-10-CM | POA: Diagnosis not present

## 2024-02-09 DIAGNOSIS — E119 Type 2 diabetes mellitus without complications: Secondary | ICD-10-CM | POA: Diagnosis not present

## 2024-02-09 DIAGNOSIS — H401132 Primary open-angle glaucoma, bilateral, moderate stage: Secondary | ICD-10-CM | POA: Diagnosis not present

## 2024-02-09 DIAGNOSIS — H2513 Age-related nuclear cataract, bilateral: Secondary | ICD-10-CM | POA: Diagnosis not present

## 2024-02-26 ENCOUNTER — Encounter: Payer: Self-pay | Admitting: Physician Assistant

## 2024-02-26 ENCOUNTER — Ambulatory Visit: Payer: Medicare Other | Admitting: Physician Assistant

## 2024-02-26 VITALS — BP 140/90 | HR 86 | Temp 98.5°F | Resp 16 | Ht 72.0 in | Wt 319.0 lb

## 2024-02-26 DIAGNOSIS — E1165 Type 2 diabetes mellitus with hyperglycemia: Secondary | ICD-10-CM

## 2024-02-26 DIAGNOSIS — I10 Essential (primary) hypertension: Secondary | ICD-10-CM | POA: Diagnosis not present

## 2024-02-26 DIAGNOSIS — M7989 Other specified soft tissue disorders: Secondary | ICD-10-CM | POA: Diagnosis not present

## 2024-02-26 DIAGNOSIS — Z6841 Body Mass Index (BMI) 40.0 and over, adult: Secondary | ICD-10-CM

## 2024-02-26 LAB — POCT GLYCOSYLATED HEMOGLOBIN (HGB A1C): Hemoglobin A1C: 7 % — AB (ref 4.0–5.6)

## 2024-02-26 MED ORDER — HYDROCHLOROTHIAZIDE 25 MG PO TABS: 25.0000 mg | ORAL_TABLET | Freq: Every day | ORAL | 3 refills | Status: DC

## 2024-02-26 NOTE — Progress Notes (Signed)
 [facility] [address] [place], [place] [address]  Internal MEDICINE  Office Visit Note  Patient Name: Shaun Patrick  [id number]  [id number]  Date of Service: 03/09/2024  Chief Complaint  Patient presents with   Follow-up   Gastroesophageal Reflux   Diabetes   Hypertension   Quality Metric Gaps    Cologuard/Colonoscopy    HPI Pt is here for routine follow up -Doing well on metformin -down 2lbs since last visit -142/86 on recheck in office -130s at home -still some ankle swelling, needs to get compression socks -will increase hydrochlorothiazide to help BP and swelling--will need to recheck labs after increase  Current Medication: Outpatient Encounter Medications as of 02/26/2024  Medication Sig Note   Accu-Chek Softclix Lancets lancets Use as directed once a daily DX E11.65 01/21/2024: Uses spouse glucometer, lancets, test strips   aspirin 81 MG chewable tablet CHEW 1 TABLET DAILY    Blood Glucose Monitoring Suppl (ACCU-CHEK GUIDE ME) w/Device KIT USE AS DIRECTED TO CHECK GLUCOSE dxE11.65 01/21/2024: Uses spouse glucometer, lancets, test strips   clobetasol ointment (TEMOVATE) 0.05 % Apply 1 application topically 2 (two) times daily.    glucose blood (ACCU-CHEK GUIDE) test strip 1 each by Other route as needed for other. Use as instructed once daily DX E11.65 01/21/2024: Uses spouse glucometer, lancets, test strips   hydrALAZINE (APRESOLINE) 25 MG tablet Take 1 tablet (25 mg total) by mouth 2 (two) times daily.    hydrochlorothiazide (HYDRODIURIL) 25 MG tablet Take 1 tablet (25 mg total) by mouth daily.    latanoprost (XALATAN) 0.005 % ophthalmic solution 1 drop at bedtime.    metFORMIN (GLUCOPHAGE-XR) 500 MG 24 hr tablet TAKE 1 TABLET BY MOUTH EVERY DAY WITH BREAKFAST    metoprolol succinate (TOPROL-XL) 25 MG 24 hr tablet TAKE 1 TABLET (25 MG TOTAL) BY MOUTH DAILY.    montelukast (SINGULAIR) 10 MG tablet TAKE 1 TABLET BY MOUTH EVERY DAY    Multiple Vitamin  (MULTI-VITAMINS) TABS Take 1 tablet by mouth 1 day or 1 dose. 05/16/2015: Received from: [facility] Received Sig: Take by mouth.   pantoprazole (PROTONIX) 40 MG tablet TAKE 1 TABLET BY MOUTH EVERY DAY    rosuvastatin (CRESTOR) 20 MG tablet TAKE 1 TABLET BY MOUTH EVERY DAY    sildenafil (VIAGRA) 100 MG tablet Take 0.5-1 tablets (50-100 mg total) by mouth daily as needed for erectile dysfunction.    [DISCONTINUED] hydrochlorothiazide (HYDRODIURIL) 12.5 MG tablet TAKE 1 TABLET BY MOUTH EVERY DAY    [DISCONTINUED] Semaglutide (RYBELSUS) 14 MG TABS Take 1 tablet (14 mg total) by mouth daily. 01/21/2024: Due to cost   No facility-administered encounter medications on file as of 02/26/2024.    Surgical History: Past Surgical History:  Procedure Laterality Date   HERNIA REPAIR     umbilical   JOINT REPLACEMENT Bilateral    knees   KNEE SURGERY Bilateral    knee replacements   NASAL SEPTOPLASTY W/ TURBINOPLASTY Bilateral 10/17/2015   Procedure: NASAL SEPTOPLASTY WITH TURBINATE REDUCTION;  Surgeon: Linus Salmons, MD;  Location: [facility] ORS;  Service: ENT;  Laterality: Bilateral;   NECK SURGERY  1979   broke neck   TONSILECTOMY, ADENOIDECTOMY, BILATERAL MYRINGOTOMY AND TUBES     TONSILLECTOMY      Medical History: Past Medical History:  Diagnosis Date   GERD (gastroesophageal reflux disease)    Hypertension     Family History: Family History  Problem Relation Age of Onset   Stroke Mother  Heart disease Mother    Hypertension Mother    Liver disease Father    Hypertension Sister     Social History   Socioeconomic History   Marital status: Married    Spouse name: Not on file   Number of children: Not on file   Years of education: Not on file   Highest education level: Not on file  Occupational History   Occupation: med lab techinician     Employer: [employer]  Tobacco Use   Smoking status: Former    Current packs/day: 0.00    Types:  Cigarettes    Start date: 12/03/1983    Quit date: 12/02/1993    Years since quitting: 30.2   Smokeless tobacco: Never  Substance and Sexual Activity   Alcohol use: Yes    Alcohol/week: 4.0 standard drinks of alcohol    Types: 4 Cans of beer per week   Drug use: No   Sexual activity: Never  Other Topics Concern   Not on file  Social History Narrative   Not on file   Social Drivers of Health   Financial Resource Strain: Low Risk  (11/12/2023)   Received from [facility]   Overall Financial Resource Strain (CARDIA)    Difficulty of Paying Living Expenses: Not hard at all  Food Insecurity: No Food Insecurity (11/12/2023)   Received from [facility]   Hunger Vital Sign    Worried About Running Out of Food in the Last Year: Never true    Ran Out of Food in the Last Year: Never true  Transportation Needs: No Transportation Needs (11/12/2023)   Received from [facility] - Transportation    In the past 12 months, has lack of transportation kept you from medical appointments or from getting medications?: No    Lack of Transportation (Non-Medical): No  Physical Activity: Not on file  Stress: Not on file  Social Connections: Not on file  Intimate Partner Violence: Not on file      Review of Systems  Constitutional:  Negative for chills, fatigue and unexpected weight change.  HENT:  Negative for congestion, rhinorrhea, sneezing and sore throat.   Eyes:  Negative for redness.  Respiratory:  Negative for cough, chest tightness and shortness of breath.   Cardiovascular:  Negative for chest pain and palpitations.  Gastrointestinal:  Negative for abdominal pain, constipation, diarrhea, nausea and vomiting.  Genitourinary:  Negative for dysuria and frequency.  Musculoskeletal:  Positive for arthralgias. Negative for back pain, joint swelling and neck pain.  Skin:  Negative for rash.  Neurological: Negative.  Negative for  tremors and numbness.  Hematological:  Negative for adenopathy. Does not bruise/bleed easily.  Psychiatric/Behavioral:  Negative for behavioral problems (Depression), sleep disturbance and suicidal ideas. The patient is not nervous/anxious.     Vital Signs: BP (!) 140/90   Pulse 86   Temp 98.5 F (36.9 C)   Resp 16   Ht 6' (1.829 m)   Wt (!) 319 lb (144.7 kg)   SpO2 97%   BMI 43.26 kg/m    Physical Exam Vitals and nursing note reviewed.  Constitutional:      General: He is not in acute distress.    Appearance: Normal appearance. He is well-developed. He is obese.  HENT:     Head: Normocephalic and atraumatic.  Eyes:     Pupils: Pupils are equal, round, and reactive to light.  Neck:  Thyroid: No thyromegaly.     Vascular: No JVD.     Trachea: No tracheal deviation.  Cardiovascular:     Rate and Rhythm: Normal rate and regular rhythm.     Heart sounds: Normal heart sounds.     No friction rub.  Pulmonary:     Effort: Pulmonary effort is normal.  Musculoskeletal:     Right lower leg: Edema present.     Left lower leg: Edema present.  Skin:    General: Skin is warm and dry.  Neurological:     Mental Status: He is alert and oriented to person, place, and time.     Cranial Nerves: No cranial nerve deficit.  Psychiatric:        Behavior: Behavior normal.        Thought Content: Thought content normal.        Judgment: Judgment normal.        Assessment/Plan: 1. Type 2 diabetes mellitus with hyperglycemia, without long-term current use of insulin (HCC) (Primary) - POCT HgB A1C is 7.0 which is improved from 7.8 last visit and will continue metformin and working on diet and exercise  2. Essential hypertension Will increase hydrochlorothiazide to 25mg  to help BG and swelling - hydrochlorothiazide (HYDRODIURIL) 25 MG tablet; Take 1 tablet (25 mg total) by mouth daily.  Dispense: 90 tablet; Refill: 3  3. Localized swelling of both lower extremities Will increase  hydrochlorothiazide to help swelling, will also wear compression stockings and elevated legs. Will recheck labs after a few weeks on higher dose - hydrochlorothiazide (HYDRODIURIL) 25 MG tablet; Take 1 tablet (25 mg total) by mouth daily.  Dispense: 90 tablet; Refill: 3 - Comprehensive metabolic panel with GFR  4. Morbid obesity with BMI of 45.0-49.9, adult (HCC) Down 2lbs since last visit and will need to continue to work on this   General Counseling: Rodger verbalizes understanding of the findings of todays visit and agrees with plan of treatment. I have discussed any further diagnostic evaluation that may be needed or ordered today. We also reviewed his medications today. he has been encouraged to call the office with any questions or concerns that should arise related to todays visit.    Orders Placed This Encounter  Procedures   Comprehensive metabolic panel with GFR   POCT HgB A1C    Meds ordered this encounter  Medications   hydrochlorothiazide (HYDRODIURIL) 25 MG tablet    Sig: Take 1 tablet (25 mg total) by mouth daily.    Dispense:  90 tablet    Refill:  3    This patient was seen by Lynn Ito, PA-C in collaboration with Dr. Beverely Risen as a part of collaborative care agreement.   Total time spent:30 Minutes Time spent includes review of chart, medications, test results, and follow up plan with the patient.      Dr Lyndon Code Internal medicine

## 2024-03-25 ENCOUNTER — Telehealth: Payer: Self-pay

## 2024-03-25 DIAGNOSIS — Z79899 Other long term (current) drug therapy: Secondary | ICD-10-CM

## 2024-03-25 NOTE — Progress Notes (Signed)
   03/25/2024  Patient ID: Shaun Patrick., male   DOB: 05/31/1956, 68 y.o.   MRN: [id number]  Attempted to contact patient for medication management/review. I was unable to leave a HIPAA compliant message for patient to return my call at their convenience.   First attempt for patient outreach. Will follow up next month.   Thank you for allowing pharmacy to be a part of this patient's care.  Alexandria Angel, PharmD Clinical Pharmacist Cell: [phone]

## 2024-04-14 DIAGNOSIS — B351 Tinea unguium: Secondary | ICD-10-CM | POA: Diagnosis not present

## 2024-04-14 DIAGNOSIS — E119 Type 2 diabetes mellitus without complications: Secondary | ICD-10-CM | POA: Diagnosis not present

## 2024-04-29 ENCOUNTER — Encounter: Payer: Self-pay | Admitting: Physician Assistant

## 2024-04-29 ENCOUNTER — Ambulatory Visit (INDEPENDENT_AMBULATORY_CARE_PROVIDER_SITE_OTHER): Admitting: Physician Assistant

## 2024-04-29 VITALS — BP 146/94 | HR 91 | Temp 98.1°F | Resp 16 | Ht 72.0 in | Wt 316.0 lb

## 2024-04-29 DIAGNOSIS — Z1212 Encounter for screening for malignant neoplasm of rectum: Secondary | ICD-10-CM

## 2024-04-29 DIAGNOSIS — Z23 Encounter for immunization: Secondary | ICD-10-CM | POA: Diagnosis not present

## 2024-04-29 DIAGNOSIS — Z1211 Encounter for screening for malignant neoplasm of colon: Secondary | ICD-10-CM | POA: Diagnosis not present

## 2024-04-29 DIAGNOSIS — E785 Hyperlipidemia, unspecified: Secondary | ICD-10-CM

## 2024-04-29 DIAGNOSIS — M7989 Other specified soft tissue disorders: Secondary | ICD-10-CM | POA: Diagnosis not present

## 2024-04-29 DIAGNOSIS — Z6841 Body Mass Index (BMI) 40.0 and over, adult: Secondary | ICD-10-CM

## 2024-04-29 DIAGNOSIS — I10 Essential (primary) hypertension: Secondary | ICD-10-CM

## 2024-04-29 MED ORDER — ROSUVASTATIN CALCIUM 20 MG PO TABS: 20.0000 mg | ORAL_TABLET | Freq: Every day | ORAL | 1 refills | Status: DC

## 2024-04-29 MED ORDER — HYDRALAZINE HCL 25 MG PO TABS
25.0000 mg | ORAL_TABLET | Freq: Three times a day (TID) | ORAL | 1 refills | Status: DC
Start: 2024-04-29 — End: 2024-07-26

## 2024-04-29 MED ORDER — SHINGRIX 50 MCG/0.5ML IM SUSR: 0.5000 mL | Freq: Once | INTRAMUSCULAR | 0 refills | Status: AC

## 2024-04-29 NOTE — Progress Notes (Signed)
 [facility] [address] [place], [place] [address]  Internal MEDICINE  Office Visit Note  Patient Name: Shaun Patrick  [id number]  [id number]  Date of Service: 04/29/2024  Chief Complaint  Patient presents with   Follow-up   Hypertension   Gastroesophageal Reflux   Quality Metric Gaps    Colonoscopy and Shingles vaccines    HPI Pt is here for routine follow up -Bp about the same, 130s-140s at home -Taking 25mg  hydrochlorothiazide  now, swelling a little better but still needs to get compression stockings. Still taking hydralazine  BID and metoprolol  once daily. Will increase hydralazine  to TID and monitor. Still needs to have labs done since increasing hctz last visit. -down another 3 lbs since last visit -due for shingles vaccines and will schedule this -due for colon screening, declines colonoscopy and would like to repeat cologuard  Current Medication: Outpatient Encounter Medications as of 04/29/2024  Medication Sig Note   Accu-Chek Softclix Lancets lancets Use as directed once a daily DX E11.65 01/21/2024: Uses spouse glucometer, lancets, test strips   aspirin  81 MG chewable tablet CHEW 1 TABLET DAILY    Blood Glucose Monitoring Suppl (ACCU-CHEK GUIDE ME) w/Device KIT USE AS DIRECTED TO CHECK GLUCOSE dxE11.65 01/21/2024: Uses spouse glucometer, lancets, test strips   clobetasol  ointment (TEMOVATE ) 0.05 % Apply 1 application topically 2 (two) times daily.    glucose blood (ACCU-CHEK GUIDE) test strip 1 each by Other route as needed for other. Use as instructed once daily DX E11.65 01/21/2024: Uses spouse glucometer, lancets, test strips   hydrochlorothiazide  (HYDRODIURIL ) 25 MG tablet Take 1 tablet (25 mg total) by mouth daily.    latanoprost (XALATAN) 0.005 % ophthalmic solution 1 drop at bedtime.    metFORMIN  (GLUCOPHAGE -XR) 500 MG 24 hr tablet TAKE 1 TABLET BY MOUTH EVERY DAY WITH BREAKFAST    metoprolol  succinate (TOPROL -XL) 25 MG 24 hr tablet TAKE 1 TABLET (25  MG TOTAL) BY MOUTH DAILY.    montelukast  (SINGULAIR ) 10 MG tablet TAKE 1 TABLET BY MOUTH EVERY DAY    Multiple Vitamin (MULTI-VITAMINS) TABS Take 1 tablet by mouth 1 day or 1 dose. 05/16/2015: Received from: [facility] Received Sig: Take by mouth.   pantoprazole  (PROTONIX ) 40 MG tablet TAKE 1 TABLET BY MOUTH EVERY DAY    sildenafil  (VIAGRA ) 100 MG tablet Take 0.5-1 tablets (50-100 mg total) by mouth daily as needed for erectile dysfunction.    [DISCONTINUED] hydrALAZINE  (APRESOLINE ) 25 MG tablet Take 1 tablet (25 mg total) by mouth 2 (two) times daily.    [DISCONTINUED] rosuvastatin  (CRESTOR ) 20 MG tablet TAKE 1 TABLET BY MOUTH EVERY DAY    [DISCONTINUED] Zoster Vaccine Adjuvanted [facility]) injection Inject 0.5 mLs into the muscle once.    hydrALAZINE  (APRESOLINE ) 25 MG tablet Take 1 tablet (25 mg total) by mouth 3 (three) times daily.    rosuvastatin  (CRESTOR ) 20 MG tablet Take 1 tablet (20 mg total) by mouth daily.    Zoster Vaccine Adjuvanted [facility]) injection Inject 0.5 mLs into the muscle once for 1 dose.    No facility-administered encounter medications on file as of 04/29/2024.    Surgical History: Past Surgical History:  Procedure Laterality Date   HERNIA REPAIR     umbilical   JOINT REPLACEMENT Bilateral    knees   KNEE SURGERY Bilateral    knee replacements   NASAL SEPTOPLASTY W/ TURBINOPLASTY Bilateral 10/17/2015   Procedure: NASAL SEPTOPLASTY WITH TURBINATE REDUCTION;  Surgeon: Lesly Raspberry, MD;  Location: [facility] ORS;  Service: ENT;  Laterality: Bilateral;   NECK SURGERY  1979   broke neck   TONSILECTOMY, ADENOIDECTOMY, BILATERAL MYRINGOTOMY AND TUBES     TONSILLECTOMY      Medical History: Past Medical History:  Diagnosis Date   GERD (gastroesophageal reflux disease)    Hypertension     Family History: Family History  Problem Relation Age of Onset   Stroke Mother    Heart disease Mother    Hypertension Mother    Liver disease Father     Hypertension Sister     Social History   Socioeconomic History   Marital status: Married    Spouse name: Not on file   Number of children: Not on file   Years of education: Not on file   Highest education level: Not on file  Occupational History   Occupation: med lab techinician     Employer: [employer]  Tobacco Use   Smoking status: Former    Current packs/day: 0.00    Types: Cigarettes    Start date: 12/03/1983    Quit date: 12/02/1993    Years since quitting: 30.4   Smokeless tobacco: Never  Substance and Sexual Activity   Alcohol use: Yes    Alcohol/week: 4.0 standard drinks of alcohol    Types: 4 Cans of beer per week   Drug use: No   Sexual activity: Never  Other Topics Concern   Not on file  Social History Narrative   Not on file   Social Drivers of Health   Financial Resource Strain: Low Risk  (11/12/2023)   Received from [facility]   Overall Financial Resource Strain (CARDIA)    Difficulty of Paying Living Expenses: Not hard at all  Food Insecurity: No Food Insecurity (11/12/2023)   Received from [facility]   Hunger Vital Sign    Worried About Running Out of Food in the Last Year: Never true    Ran Out of Food in the Last Year: Never true  Transportation Needs: No Transportation Needs (11/12/2023)   Received from [facility] - Transportation    In the past 12 months, has lack of transportation kept you from medical appointments or from getting medications?: No    Lack of Transportation (Non-Medical): No  Physical Activity: Not on file  Stress: Not on file  Social Connections: Not on file  Intimate Partner Violence: Not on file      Review of Systems  Constitutional:  Negative for chills, fatigue and unexpected weight change.  HENT:  Negative for congestion, rhinorrhea, sneezing and sore throat.   Eyes:  Negative for redness.  Respiratory:  Negative for cough, chest  tightness and shortness of breath.   Cardiovascular:  Negative for chest pain and palpitations.  Gastrointestinal:  Negative for abdominal pain, constipation, diarrhea, nausea and vomiting.  Genitourinary:  Negative for dysuria and frequency.  Musculoskeletal:  Positive for arthralgias. Negative for back pain, joint swelling and neck pain.  Skin:  Negative for rash.  Neurological: Negative.  Negative for tremors and numbness.  Hematological:  Negative for adenopathy. Does not bruise/bleed easily.  Psychiatric/Behavioral:  Negative for behavioral problems (Depression), sleep disturbance and suicidal ideas. The patient is not nervous/anxious.     Vital Signs: BP (!) 146/94 Comment: 145/95  Pulse 91   Temp 98.1 F (36.7 C)   Resp 16   Ht 6' (1.829 m)   Wt (!) 316 lb (143.3 kg)   SpO2  96%   BMI 42.86 kg/m    Physical Exam Vitals and nursing note reviewed.  Constitutional:      General: He is not in acute distress.    Appearance: Normal appearance. He is well-developed. He is obese.  HENT:     Head: Normocephalic and atraumatic.  Eyes:     Pupils: Pupils are equal, round, and reactive to light.  Neck:     Thyroid : No thyromegaly.     Vascular: No JVD.     Trachea: No tracheal deviation.  Cardiovascular:     Rate and Rhythm: Normal rate and regular rhythm.     Heart sounds: Normal heart sounds.     No friction rub.  Pulmonary:     Effort: Pulmonary effort is normal.  Musculoskeletal:     Right lower leg: Edema present.     Left lower leg: Edema present.  Skin:    General: Skin is warm and dry.  Neurological:     Mental Status: He is alert and oriented to person, place, and time.     Cranial Nerves: No cranial nerve deficit.  Psychiatric:        Behavior: Behavior normal.        Thought Content: Thought content normal.        Judgment: Judgment normal.        Assessment/Plan: 1. Essential hypertension (Primary) Will increase hydralazine  to TID and continue  hydrochlorothiazide  and metoprolol  as before. Continue to monitor. May need to consider echo in future - hydrALAZINE  (APRESOLINE ) 25 MG tablet; Take 1 tablet (25 mg total) by mouth 3 (three) times daily.  Dispense: 180 tablet; Refill: 1  2. Localized swelling of both lower extremities Improving, will use compression stockings. May consider echo in future  3. Dyslipidemia Continue crestor  - rosuvastatin  (CRESTOR ) 20 MG tablet; Take 1 tablet (20 mg total) by mouth daily.  Dispense: 90 tablet; Refill: 1  4. Screening for colorectal cancer - Cologuard  5. Need for shingles vaccine - Zoster Vaccine Adjuvanted [facility]) injection; Inject 0.5 mLs into the muscle once for 1 dose.  Dispense: 0.5 mL; Refill: 0  6. Morbid obesity with BMI of 40.0-44.9, adult (HCC) Down 3 lbs since last visit and encouraged to continue working on this   General Counseling: Pearce verbalizes understanding of the findings of todays visit and agrees with plan of treatment. I have discussed any further diagnostic evaluation that may be needed or ordered today. We also reviewed his medications today. he has been encouraged to call the office with any questions or concerns that should arise related to todays visit.    Orders Placed This Encounter  Procedures   Cologuard    Meds ordered this encounter  Medications   Zoster Vaccine Adjuvanted [facility]) injection    Sig: Inject 0.5 mLs into the muscle once for 1 dose.    Dispense:  0.5 mL    Refill:  0   hydrALAZINE  (APRESOLINE ) 25 MG tablet    Sig: Take 1 tablet (25 mg total) by mouth 3 (three) times daily.    Dispense:  180 tablet    Refill:  1   rosuvastatin  (CRESTOR ) 20 MG tablet    Sig: Take 1 tablet (20 mg total) by mouth daily.    Dispense:  90 tablet    Refill:  1    This patient was seen by Taylor Favia, PA-C in collaboration with Dr. Verneta Gone as a part of collaborative care agreement.   Total time spent:30 Minutes  Time spent includes  review of chart, medications, test results, and follow up plan with the patient.      Dr Fozia M Khan Internal medicine

## 2024-05-03 ENCOUNTER — Telehealth: Payer: Self-pay

## 2024-05-03 DIAGNOSIS — Z79899 Other long term (current) drug therapy: Secondary | ICD-10-CM

## 2024-05-03 NOTE — Progress Notes (Signed)
   05/03/2024  Patient ID: Wille Harms., male   DOB: 1956-06-22, 68 y.o.   MRN: [id number]  Medication Adherence:   Rosuvastatin  20 mg last filled on 04/07/2024 for 90 days supply. Metformin  last filled on 03/31/2024 for 90 days supply, per Dr. Anson Basta.   Will follow up patient for diabetes [employer].   Alexandria Angel, PharmD Clinical Pharmacist Cell: [phone]

## 2024-05-27 ENCOUNTER — Ambulatory Visit: Admitting: Physician Assistant

## 2024-06-10 ENCOUNTER — Ambulatory Visit: Admitting: Physician Assistant

## 2024-06-10 ENCOUNTER — Encounter: Payer: Self-pay | Admitting: Physician Assistant

## 2024-06-10 VITALS — BP 134/88 | HR 83 | Temp 98.0°F | Resp 16 | Ht 72.0 in | Wt 314.0 lb

## 2024-06-10 DIAGNOSIS — E782 Mixed hyperlipidemia: Secondary | ICD-10-CM | POA: Diagnosis not present

## 2024-06-10 DIAGNOSIS — Z6841 Body Mass Index (BMI) 40.0 and over, adult: Secondary | ICD-10-CM

## 2024-06-10 DIAGNOSIS — I10 Essential (primary) hypertension: Secondary | ICD-10-CM | POA: Diagnosis not present

## 2024-06-10 DIAGNOSIS — R5383 Other fatigue: Secondary | ICD-10-CM | POA: Diagnosis not present

## 2024-06-10 DIAGNOSIS — E1165 Type 2 diabetes mellitus with hyperglycemia: Secondary | ICD-10-CM

## 2024-06-10 DIAGNOSIS — Z125 Encounter for screening for malignant neoplasm of prostate: Secondary | ICD-10-CM

## 2024-06-10 DIAGNOSIS — R7989 Other specified abnormal findings of blood chemistry: Secondary | ICD-10-CM | POA: Diagnosis not present

## 2024-06-10 NOTE — Progress Notes (Signed)
 [facility] [address] [place], [place] [address]  Internal MEDICINE  Office Visit Note  Patient Name: Shaun Patrick  [id number]  [id number]  Date of Service: 06/10/2024  Chief Complaint  Patient presents with   Follow-up   Hypertension   Gastroesophageal Reflux   Quality Metric Gaps    Colonoscopy    HPI Pt is here for routine follow up -down 2 lbs since last visit -Bp improved, tolerating meds -cologuard not sent in due to loose sample and will request new one -will have labs for CPE  Current Medication: Outpatient Encounter Medications as of 06/10/2024  Medication Sig Note   Accu-Chek Softclix Lancets lancets Use as directed once a daily DX E11.65 01/21/2024: Uses spouse glucometer, lancets, test strips   aspirin  81 MG chewable tablet CHEW 1 TABLET DAILY    Blood Glucose Monitoring Suppl (ACCU-CHEK GUIDE ME) w/Device KIT USE AS DIRECTED TO CHECK GLUCOSE dxE11.65 01/21/2024: Uses spouse glucometer, lancets, test strips   clobetasol  ointment (TEMOVATE ) 0.05 % Apply 1 application topically 2 (two) times daily.    glucose blood (ACCU-CHEK GUIDE) test strip 1 each by Other route as needed for other. Use as instructed once daily DX E11.65 01/21/2024: Uses spouse glucometer, lancets, test strips   hydrALAZINE  (APRESOLINE ) 25 MG tablet Take 1 tablet (25 mg total) by mouth 3 (three) times daily.    hydrochlorothiazide  (HYDRODIURIL ) 25 MG tablet Take 1 tablet (25 mg total) by mouth daily.    latanoprost (XALATAN) 0.005 % ophthalmic solution 1 drop at bedtime.    metFORMIN  (GLUCOPHAGE -XR) 500 MG 24 hr tablet TAKE 1 TABLET BY MOUTH EVERY DAY WITH BREAKFAST    metoprolol  succinate (TOPROL -XL) 25 MG 24 hr tablet TAKE 1 TABLET (25 MG TOTAL) BY MOUTH DAILY.    montelukast  (SINGULAIR ) 10 MG tablet TAKE 1 TABLET BY MOUTH EVERY DAY    Multiple Vitamin (MULTI-VITAMINS) TABS Take 1 tablet by mouth 1 day or 1 dose. 05/16/2015: Received from: [facility] Received Sig:  Take by mouth.   pantoprazole  (PROTONIX ) 40 MG tablet TAKE 1 TABLET BY MOUTH EVERY DAY    rosuvastatin  (CRESTOR ) 20 MG tablet Take 1 tablet (20 mg total) by mouth daily.    sildenafil  (VIAGRA ) 100 MG tablet Take 0.5-1 tablets (50-100 mg total) by mouth daily as needed for erectile dysfunction.    No facility-administered encounter medications on file as of 06/10/2024.    Surgical History: Past Surgical History:  Procedure Laterality Date   HERNIA REPAIR     umbilical   JOINT REPLACEMENT Bilateral    knees   KNEE SURGERY Bilateral    knee replacements   NASAL SEPTOPLASTY W/ TURBINOPLASTY Bilateral 10/17/2015   Procedure: NASAL SEPTOPLASTY WITH TURBINATE REDUCTION;  Surgeon: Chinita Hasten, MD;  Location: [facility] ORS;  Service: ENT;  Laterality: Bilateral;   NECK SURGERY  1979   broke neck   TONSILECTOMY, ADENOIDECTOMY, BILATERAL MYRINGOTOMY AND TUBES     TONSILLECTOMY      Medical History: Past Medical History:  Diagnosis Date   GERD (gastroesophageal reflux disease)    Hypertension     Family History: Family History  Problem Relation Age of Onset   Stroke Mother    Heart disease Mother    Hypertension Mother    Liver disease Father    Hypertension Sister     Social History   Socioeconomic History   Marital status: Married    Spouse name: Not on file   Number of children: Not on  file   Years of education: Not on file   Highest education level: Not on file  Occupational History   Occupation: med lab techinician     Employer: [employer]  Tobacco Use   Smoking status: Former    Current packs/day: 0.00    Types: Cigarettes    Start date: 12/03/1983    Quit date: 12/02/1993    Years since quitting: 30.5   Smokeless tobacco: Never  Substance and Sexual Activity   Alcohol use: Yes    Alcohol/week: 4.0 standard drinks of alcohol    Types: 4 Cans of beer per week   Drug use: No   Sexual activity: Never  Other Topics Concern   Not on file  Social  History Narrative   Not on file   Social Drivers of Health   Financial Resource Strain: Low Risk  (11/12/2023)   Received from [facility]   Overall Financial Resource Strain (CARDIA)    Difficulty of Paying Living Expenses: Not hard at all  Food Insecurity: No Food Insecurity (11/12/2023)   Received from [facility]   Hunger Vital Sign    Within the past 12 months, you worried that your food would run out before you got the money to buy more.: Never true    Within the past 12 months, the food you bought just didn't last and you didn't have money to get more.: Never true  Transportation Needs: No Transportation Needs (11/12/2023)   Received from [facility] - Transportation    In the past 12 months, has lack of transportation kept you from medical appointments or from getting medications?: No    Lack of Transportation (Non-Medical): No  Physical Activity: Not on file  Stress: Not on file  Social Connections: Not on file  Intimate Partner Violence: Not on file      Review of Systems  Constitutional:  Negative for chills, fatigue and unexpected weight change.  HENT:  Negative for congestion, rhinorrhea, sneezing and sore throat.   Eyes:  Negative for redness.  Respiratory:  Negative for cough, chest tightness and shortness of breath.   Cardiovascular:  Negative for chest pain and palpitations.  Gastrointestinal:  Negative for abdominal pain, constipation, diarrhea, nausea and vomiting.  Genitourinary:  Negative for dysuria and frequency.  Musculoskeletal:  Positive for arthralgias. Negative for back pain, joint swelling and neck pain.  Skin:  Negative for rash.  Neurological: Negative.  Negative for tremors and numbness.  Hematological:  Negative for adenopathy. Does not bruise/bleed easily.  Psychiatric/Behavioral:  Negative for behavioral problems (Depression), sleep disturbance and suicidal ideas. The patient is  not nervous/anxious.     Vital Signs: BP 134/88   Pulse 83   Temp 98 F (36.7 C)   Resp 16   Ht 6' (1.829 m)   Wt (!) 314 lb (142.4 kg)   SpO2 97%   BMI 42.59 kg/m    Physical Exam Vitals and nursing note reviewed.  Constitutional:      General: He is not in acute distress.    Appearance: Normal appearance. He is well-developed. He is obese.  HENT:     Head: Normocephalic and atraumatic.  Eyes:     Pupils: Pupils are equal, round, and reactive to light.  Neck:     Thyroid : No thyromegaly.     Vascular: No JVD.     Trachea: No tracheal deviation.  Cardiovascular:     Rate and Rhythm:  Normal rate and regular rhythm.     Heart sounds: Normal heart sounds.     No friction rub.  Pulmonary:     Effort: Pulmonary effort is normal.  Skin:    General: Skin is warm and dry.  Neurological:     Mental Status: He is alert and oriented to person, place, and time.  Psychiatric:        Behavior: Behavior normal.        Thought Content: Thought content normal.        Judgment: Judgment normal.        Assessment/Plan: 1. Essential hypertension (Primary) Improved, continue current medication and monitoring  2. Type 2 diabetes mellitus with hyperglycemia, without long-term current use of insulin (HCC) Continue metformin  as before and will update labs - Hgb A1C w/o eAG  3. Mixed hyperlipidemia Continue Crestor , update labs - Lipid Panel With LDL/HDL Ratio  4. Abnormal thyroid  blood test - TSH + free T4  5. Screening for prostate cancer - PSA Total (Reflex To Free)  6. Other fatigue - CBC w/Diff/Platelet - Comprehensive metabolic panel with GFR - TSH + free T4 - PSA Total (Reflex To Free) - Lipid Panel With LDL/HDL Ratio - Hgb A1C w/o eAG  7. Morbid obesity with BMI of 40.0-44.9, adult (HCC) Down 2 pounds since last visit will continue to work on weight loss goals   General Counseling: Heitor verbalizes understanding of the findings of todays visit and agrees  with plan of treatment. I have discussed any further diagnostic evaluation that may be needed or ordered today. We also reviewed his medications today. he has been encouraged to call the office with any questions or concerns that should arise related to todays visit.    No orders of the defined types were placed in this encounter.   No orders of the defined types were placed in this encounter.   This patient was seen by Tinnie Pro, PA-C in collaboration with Dr. Sigrid Bathe as a part of collaborative care agreement.   Total time spent:30 Minutes Time spent includes review of chart, medications, test results, and follow up plan with the patient.      Dr Fozia M Khan Internal medicine

## 2024-06-22 ENCOUNTER — Telehealth: Payer: Self-pay

## 2024-06-22 NOTE — Telephone Encounter (Signed)
 [employer] called asking if they have correct number for patient as they are trying to get in touch with him.

## 2024-06-28 DIAGNOSIS — M79675 Pain in left toe(s): Secondary | ICD-10-CM | POA: Diagnosis not present

## 2024-06-28 DIAGNOSIS — M79674 Pain in right toe(s): Secondary | ICD-10-CM | POA: Diagnosis not present

## 2024-06-28 DIAGNOSIS — L600 Ingrowing nail: Secondary | ICD-10-CM | POA: Diagnosis not present

## 2024-06-28 DIAGNOSIS — E119 Type 2 diabetes mellitus without complications: Secondary | ICD-10-CM | POA: Diagnosis not present

## 2024-06-28 DIAGNOSIS — B351 Tinea unguium: Secondary | ICD-10-CM | POA: Diagnosis not present

## 2024-07-01 ENCOUNTER — Telehealth: Payer: Self-pay

## 2024-07-01 NOTE — Progress Notes (Signed)
   07/01/2024  Patient ID: Shaun Patrick., male   DOB: [birth year], 68 y.o.   MRN: [id number]  Attempted to contact patient for medication management/review. Left HIPAA compliant message for patient to return my call at their convenience.    Will follow up with patient in 1-2 weeks.  Thank you for allowing pharmacy to be a part of this patient's care.  Dorcas Solian, PharmD Clinical Pharmacist Cell: [phone]

## 2024-07-03 LAB — COLOGUARD

## 2024-07-16 ENCOUNTER — Telehealth: Payer: Self-pay

## 2024-07-16 NOTE — Progress Notes (Signed)
   07/16/2024  Patient ID: Shaun Patrick., male   DOB: 06/15/1956, 68 y.o.   MRN: [id number]  Attempted to contact patient for medication management/review. I was unable to leave a HIPAA compliant message for patient to return my call at their convenience.    Will follow up with patient in 2 weeks.  Thank you for allowing pharmacy to be a part of this patient's care.  Dorcas Solian, PharmD Clinical Pharmacist Cell: [phone]

## 2024-07-24 ENCOUNTER — Other Ambulatory Visit: Payer: Self-pay | Admitting: Physician Assistant

## 2024-07-24 DIAGNOSIS — I10 Essential (primary) hypertension: Secondary | ICD-10-CM

## 2024-07-24 DIAGNOSIS — K219 Gastro-esophageal reflux disease without esophagitis: Secondary | ICD-10-CM

## 2024-07-24 DIAGNOSIS — J302 Other seasonal allergic rhinitis: Secondary | ICD-10-CM

## 2024-08-04 ENCOUNTER — Telehealth: Payer: Self-pay

## 2024-08-04 ENCOUNTER — Other Ambulatory Visit: Payer: Self-pay

## 2024-08-04 DIAGNOSIS — H40003 Preglaucoma, unspecified, bilateral: Secondary | ICD-10-CM | POA: Diagnosis not present

## 2024-08-04 DIAGNOSIS — H401132 Primary open-angle glaucoma, bilateral, moderate stage: Secondary | ICD-10-CM | POA: Diagnosis not present

## 2024-08-04 NOTE — Telephone Encounter (Signed)
 Lmom that if he diabetic supply and which meter he is using

## 2024-08-04 NOTE — Progress Notes (Signed)
 08/04/2024 Name: Shaun Patrick. MRN: [id number] DOB: June 15, 1956  No chief complaint on file.   Shaun Weinberg. is a 68 y.o. year old male who presented for a telephone visit.   They were referred to the pharmacist by a quality report for assistance in managing diabetes.    Subjective:  Care Team: Primary Care Provider: Kristina Tinnie MARLA DEVONNA ; Next Scheduled Visit: 08/19/2024   Medication Access/Adherence  Current Pharmacy:  [facility] [phone] [address] [place], [place] - [address] [place] [address] Phone: [phone] Fax: [phone]   Patient reports affordability concerns with their medications: No  Patient reports access/transportation concerns to their pharmacy: No  Patient reports adherence concerns with their medications:  No     Diabetes:  Current medications: Metformin  XR 500 mg  - Note: This medication was not on the medication adherence report. Last sold on 06/29/2024 for 90 days supply.    Current glucose readings: He does not check his blood glucose   Patient denies hypoglycemic s/sx including dizziness, shakiness, sweating. Patient denies hyperglycemic symptoms including polyuria, polydipsia, polyphagia, nocturia, neuropathy, blurred vision.  Current meal patterns:  - Breakfast: sometimes eat - Lunch/ Supper: hamburger, gravy, [employer], [employer], [employer] - Snacks: peanuts, candy, potato chips - Drinks: tea, water  Current physical activity: Not exercising   - reports knee replacement  Current medication access support: [employer] Medicare   Macrovascular and Microvascular Risk Reduction:  Statin? yes (rosuvastatin  20 mg);This medication was on the adherence report which was last sold on 07/03/2024 for 90 days supply. ACEi/ARB? no; therapy not indicated  Last urinary albumin/creatinine ratio:  Lab Results  Component Value Date   MICRALBCREAT 2 08/14/2023   Last eye exam:  Did not discuss Last foot exam: No foot exam found Tobacco  Use:  Tobacco Use: Medium Risk (06/28/2024)   Received from [facility]   Patient History    Smoking Tobacco Use: Former    Smokeless Tobacco Use: Never    Passive Exposure: Not on file     Objective:  Lab Results  Component Value Date   HGBA1C 7.0 (A) 02/26/2024    Lab Results  Component Value Date   CREATININE 0.89 08/13/2023   BUN 10 08/13/2023   NA 141 08/13/2023   K 4.5 08/13/2023   CL 102 08/13/2023   CO2 23 08/13/2023    Lab Results  Component Value Date   CHOL 130 08/13/2023   HDL 34 (L) 08/13/2023   LDLCALC 70 08/13/2023   TRIG 147 08/13/2023   CHOLHDL 4.5 09/17/2019    Medications Reviewed Today     Reviewed by Cleatus Dorcas SAUNDERS, RPH (Pharmacist) on 08/04/24 at 1302  Med List Status: <None>   Medication Order Taking? Sig Documenting Provider Last Dose Status Informant  Accu-Chek Softclix Lancets lancets [id number]  Use as directed once a daily DX E11.65  Patient not taking: Reported on 08/04/2024   Kristina Tinnie MARLA, PA-C  Active            Med Note MAYER, [facility] R   Wed Jan 21, 2024 10:43 AM) Uses spouse glucometer, lancets, test strips  aspirin  81 MG chewable tablet [id number] Yes CHEW 1 TABLET DAILY Karamalegos, Marsa PARAS, DO  Active   Blood Glucose Monitoring Suppl (ACCU-CHEK GUIDE ME) w/Device KIT [id number]  USE AS DIRECTED TO CHECK GLUCOSE dxE11.65  Patient not taking: Reported on 08/04/2024   Kristina Tinnie MARLA, PA-C  Active  Med Note MAYER SPEAKS R   Wed Jan 21, 2024 10:43 AM) Uses spouse glucometer, lancets, test strips  clobetasol  ointment (TEMOVATE ) 0.05 % [id number]  Apply 1 application topically 2 (two) times daily.  Patient not taking: Reported on 08/04/2024   Abernathy, Alyssa, NP  Active   glucose blood (ACCU-CHEK GUIDE) test strip [id number]  1 each by Other route as needed for other. Use as instructed once daily DX E11.65  Patient not taking: Reported on 08/04/2024   Kristina Tinnie POUR, PA-C  Active             Med Note MAYER, [facility] R   Wed Jan 21, 2024 10:44 AM) Uses spouse glucometer, lancets, test strips  hydrALAZINE  (APRESOLINE ) 25 MG tablet [id number] Yes TAKE 1 TABLET BY MOUTH TWICE A DAY McDonough, Lauren K, PA-C  Active            Med Note MAYER, [facility] R   Wed Aug 04, 2024  1:00 PM) Patient reports taking three times daily  hydrochlorothiazide  (HYDRODIURIL ) 25 MG tablet [id number] Yes Take 1 tablet (25 mg total) by mouth daily. McDonough, Lauren K, PA-C  Active   latanoprost (XALATAN) 0.005 % ophthalmic solution [id number] Yes 1 drop at bedtime. [provider]  Active   metFORMIN  (GLUCOPHAGE -XR) 500 MG 24 hr tablet [id number] Yes TAKE 1 TABLET BY MOUTH EVERY DAY WITH BREAKFAST McDonough, Lauren K, PA-C  Active   metoprolol  succinate (TOPROL -XL) 25 MG 24 hr tablet [id number] Yes TAKE 1 TABLET (25 MG TOTAL) BY MOUTH DAILY. McDonough, Lauren K, PA-C  Active   montelukast  (SINGULAIR ) 10 MG tablet [id number] Yes TAKE 1 TABLET BY MOUTH EVERY DAY McDonough, Lauren K, PA-C  Active   Multiple Vitamin (MULTI-VITAMINS) TABS [id number] Yes Take 1 tablet by mouth 1 day or 1 dose. [provider]  Active            Med Note ALVIA, WARREN BROCKS   Tue May 16, 2015 10:40 AM) Received from: [facility] Received Sig: Take by mouth.  pantoprazole  (PROTONIX ) 40 MG tablet [id number] Yes TAKE 1 TABLET BY MOUTH EVERY DAY McDonough, Lauren K, PA-C  Active   rosuvastatin  (CRESTOR ) 20 MG tablet [id number] Yes Take 1 tablet (20 mg total) by mouth daily. McDonough, Tinnie POUR, PA-C  Active   sildenafil  (VIAGRA ) 100 MG tablet [id number]  Take 0.5-1 tablets (50-100 mg total) by mouth daily as needed for erectile dysfunction.  Patient not taking: Reported on 08/04/2024   Boscia, Heather E, NP  Active               Assessment/Plan:   Diabetes: - Currently uncontrolled (yet hemoglobin A1c is 7% and has improved); goal A1c <7%. Cardiorenal risk reduction is opportunities for improvement..  Blood pressure is not at goal <130/80. LDL is at goal.  - Reviewed long term cardiovascular and renal outcomes of uncontrolled blood sugar., Reviewed goal A1c, goal fasting, and goal 2 hour post prandial glucose. Recommended to check glucose once daily (fasting blood glucose), Reviewed dietary modifications including less complex carbohydrates., Reviewed lifestyle modifications including water aerobics due to reported knee replacement., and encouraged patient to test blood glucose at least once daily. Will collaborate with PCP regarding diabetic testing supplies. - Recommend to continue current regimen.   Follow Up Plan: 4 weeks  - Diabetic testing supplies. Will collaborate with PCP.  - Discussed using [employer]neakers for gym membership  SPEAKS Solian, PharmD Clinical Pharmacist Cell: [phone]

## 2024-08-12 DIAGNOSIS — H2513 Age-related nuclear cataract, bilateral: Secondary | ICD-10-CM | POA: Diagnosis not present

## 2024-08-12 DIAGNOSIS — H401132 Primary open-angle glaucoma, bilateral, moderate stage: Secondary | ICD-10-CM | POA: Diagnosis not present

## 2024-08-12 DIAGNOSIS — H43813 Vitreous degeneration, bilateral: Secondary | ICD-10-CM | POA: Diagnosis not present

## 2024-08-19 ENCOUNTER — Ambulatory Visit: Payer: Medicare Other | Admitting: Physician Assistant

## 2024-08-19 ENCOUNTER — Encounter: Payer: Self-pay | Admitting: Physician Assistant

## 2024-08-19 VITALS — BP 120/70 | HR 77 | Temp 98.0°F | Resp 16 | Ht 76.0 in | Wt 309.0 lb

## 2024-08-19 DIAGNOSIS — I10 Essential (primary) hypertension: Secondary | ICD-10-CM

## 2024-08-19 DIAGNOSIS — Z0000 Encounter for general adult medical examination without abnormal findings: Secondary | ICD-10-CM

## 2024-08-19 DIAGNOSIS — Z23 Encounter for immunization: Secondary | ICD-10-CM

## 2024-08-19 DIAGNOSIS — E1165 Type 2 diabetes mellitus with hyperglycemia: Secondary | ICD-10-CM

## 2024-08-19 DIAGNOSIS — E669 Obesity, unspecified: Secondary | ICD-10-CM | POA: Diagnosis not present

## 2024-08-19 LAB — POCT GLYCOSYLATED HEMOGLOBIN (HGB A1C): Hemoglobin A1C: 6.9 % — AB (ref 4.0–5.6)

## 2024-08-19 NOTE — Progress Notes (Signed)
 [facility] [address] [place], [place] [address]  Internal MEDICINE  Office Visit Note  Patient Name: Shaun Patrick  [id number]  [id number]  Date of Service: 08/19/2024  Chief Complaint  Patient presents with   Medicare Wellness   Gastroesophageal Reflux   Hypertension   Diabetes   Quality Metric Gaps    Colonoscopy    HPI Shaun Patrick presents for an annual well visit  Well-appearing 68 y.o. male -doing well, no concerns today Routine CRC screening: has cologuard at home and will do this Eye exam and/or foot exam: foot exam in July and eye exam done last week Labs: still needs to get these done -down 5lbs since last visit -Bp well controlled     08/19/2024    1:39 PM 08/14/2023    2:07 PM 08/08/2022    2:02 PM  MMSE - Mini Mental State Exam  Orientation to time 5 5 5   Orientation to Place 5 5 5   Registration 3 3 3   Attention/ Calculation 5 5 5   Recall 3 3 3   Language- name 2 objects 2 2 2   Language- repeat 1 1 1   Language- follow 3 step command 3 3 3   Language- read & follow direction 1 1 1   Write a sentence 1 1 1   Copy design 1 1 1   Total score 30 30 30     Functional Status Survey: Is the patient deaf or have difficulty hearing?: No Does the patient have difficulty seeing, even when wearing glasses/contacts?: No Does the patient have difficulty concentrating, remembering, or making decisions?: No Does the patient have difficulty walking or climbing stairs?: No Does the patient have difficulty dressing or bathing?: No Does the patient have difficulty doing errands alone such as visiting a doctor's office or shopping?: No     12/25/2023    1:31 PM 02/26/2024    1:23 PM 04/29/2024   11:42 AM 06/10/2024    3:09 PM 08/19/2024    1:39 PM  Fall Risk  Falls in the past year? 0 0 0 0 0       08/19/2024    1:39 PM  Depression screen PHQ 2/9  Decreased Interest 0  Down, Depressed, Hopeless 0  PHQ - 2 Score 0        No data to display             Current Medication: Outpatient Encounter Medications as of 08/19/2024  Medication Sig Note   Accu-Chek Softclix Lancets lancets Use as directed once a daily DX E11.65 01/21/2024: Uses spouse glucometer, lancets, test strips   aspirin  81 MG chewable tablet CHEW 1 TABLET DAILY    Blood Glucose Monitoring Suppl (ACCU-CHEK GUIDE ME) w/Device KIT USE AS DIRECTED TO CHECK GLUCOSE dxE11.65 01/21/2024: Uses spouse glucometer, lancets, test strips   clobetasol  ointment (TEMOVATE ) 0.05 % Apply 1 application topically 2 (two) times daily.    glucose blood (ACCU-CHEK GUIDE) test strip 1 each by Other route as needed for other. Use as instructed once daily DX E11.65 01/21/2024: Uses spouse glucometer, lancets, test strips   hydrALAZINE  (APRESOLINE ) 25 MG tablet TAKE 1 TABLET BY MOUTH TWICE A DAY 08/04/2024: Patient reports taking three times daily   hydrochlorothiazide  (HYDRODIURIL ) 25 MG tablet Take 1 tablet (25 mg total) by mouth daily.    latanoprost (XALATAN) 0.005 % ophthalmic solution 1 drop at bedtime.    metFORMIN  (GLUCOPHAGE -XR) 500 MG 24 hr tablet TAKE 1 TABLET BY MOUTH EVERY DAY WITH BREAKFAST    metoprolol   succinate (TOPROL -XL) 25 MG 24 hr tablet TAKE 1 TABLET (25 MG TOTAL) BY MOUTH DAILY.    montelukast  (SINGULAIR ) 10 MG tablet TAKE 1 TABLET BY MOUTH EVERY DAY    Multiple Vitamin (MULTI-VITAMINS) TABS Take 1 tablet by mouth 1 day or 1 dose. 05/16/2015: Received from: [facility] Received Sig: Take by mouth.   pantoprazole  (PROTONIX ) 40 MG tablet TAKE 1 TABLET BY MOUTH EVERY DAY    rosuvastatin  (CRESTOR ) 20 MG tablet Take 1 tablet (20 mg total) by mouth daily.    sildenafil  (VIAGRA ) 100 MG tablet Take 0.5-1 tablets (50-100 mg total) by mouth daily as needed for erectile dysfunction.    No facility-administered encounter medications on file as of 08/19/2024.    Surgical History: Past Surgical History:  Procedure Laterality Date   HERNIA REPAIR     umbilical   JOINT  REPLACEMENT Bilateral    knees   KNEE SURGERY Bilateral    knee replacements   NASAL SEPTOPLASTY W/ TURBINOPLASTY Bilateral 10/17/2015   Procedure: NASAL SEPTOPLASTY WITH TURBINATE REDUCTION;  Surgeon: Chinita Hasten, MD;  Location: [facility] ORS;  Service: ENT;  Laterality: Bilateral;   NECK SURGERY  1979   broke neck   TONSILECTOMY, ADENOIDECTOMY, BILATERAL MYRINGOTOMY AND TUBES     TONSILLECTOMY      Medical History: Past Medical History:  Diagnosis Date   GERD (gastroesophageal reflux disease)    Hypertension     Family History: Family History  Problem Relation Age of Onset   Stroke Mother    Heart disease Mother    Hypertension Mother    Liver disease Father    Hypertension Sister     Social History   Socioeconomic History   Marital status: Married    Spouse name: Not on file   Number of children: Not on file   Years of education: Not on file   Highest education level: Not on file  Occupational History   Occupation: med lab techinician     Employer: [employer]  Tobacco Use   Smoking status: Former    Current packs/day: 0.00    Types: Cigarettes    Start date: 12/03/1983    Quit date: 12/02/1993    Years since quitting: 30.7   Smokeless tobacco: Never  Substance and Sexual Activity   Alcohol use: Yes    Alcohol/week: 4.0 standard drinks of alcohol    Types: 4 Cans of beer per week   Drug use: No   Sexual activity: Never  Other Topics Concern   Not on file  Social History Narrative   Not on file   Social Drivers of Health   Financial Resource Strain: Low Risk  (11/12/2023)   Received from [facility]   Overall Financial Resource Strain (CARDIA)    Difficulty of Paying Living Expenses: Not hard at all  Food Insecurity: No Food Insecurity (11/12/2023)   Received from [facility]   Hunger Vital Sign    Within the past 12 months, you worried that your food would run out before you got the money to buy  more.: Never true    Within the past 12 months, the food you bought just didn't last and you didn't have money to get more.: Never true  Transportation Needs: No Transportation Needs (11/12/2023)   Received from [facility]    In the past 12 months, has lack of transportation kept you from medical appointments or from  getting medications?: No    Lack of Transportation (Non-Medical): No  Physical Activity: Not on file  Stress: Not on file  Social Connections: Not on file  Intimate Partner Violence: Not on file      Review of Systems  Constitutional:  Negative for chills, fatigue and unexpected weight change.  HENT:  Negative for congestion, rhinorrhea, sneezing and sore throat.   Eyes:  Negative for redness.  Respiratory:  Negative for cough, chest tightness and shortness of breath.   Cardiovascular:  Negative for chest pain and palpitations.  Gastrointestinal:  Negative for abdominal pain, constipation, diarrhea, nausea and vomiting.  Genitourinary:  Negative for dysuria and frequency.  Musculoskeletal:  Positive for arthralgias. Negative for back pain, joint swelling and neck pain.  Skin:  Negative for rash.  Neurological: Negative.  Negative for tremors and numbness.  Hematological:  Negative for adenopathy. Does not bruise/bleed easily.  Psychiatric/Behavioral:  Negative for behavioral problems (Depression), sleep disturbance and suicidal ideas. The patient is not nervous/anxious.     Vital Signs: BP 120/70   Pulse 77   Temp 98 F (36.7 C)   Resp 16   Ht 6' 4 (1.93 m)   Wt (!) 309 lb (140.2 kg)   SpO2 98%   BMI 37.61 kg/m    Physical Exam Vitals and nursing note reviewed.  Constitutional:      General: He is not in acute distress.    Appearance: Normal appearance. He is well-developed. He is obese.  HENT:     Head: Normocephalic and atraumatic.  Eyes:     Pupils: Pupils are equal, round, and reactive to light.  Neck:      Thyroid : No thyromegaly.     Vascular: No JVD.     Trachea: No tracheal deviation.  Cardiovascular:     Rate and Rhythm: Normal rate and regular rhythm.     Heart sounds: Normal heart sounds.     No friction rub.  Pulmonary:     Effort: Pulmonary effort is normal.  Skin:    General: Skin is warm and dry.  Neurological:     Mental Status: He is alert and oriented to person, place, and time.  Psychiatric:        Behavior: Behavior normal.        Thought Content: Thought content normal.        Judgment: Judgment normal.        Assessment/Plan: 1. Encounter for annual wellness exam in Medicare patient (Primary) AWV performed, still needs to have labs done. Will redo cologuard kit  2. Type 2 diabetes mellitus with hyperglycemia, without long-term current use of insulin (HCC) - Urine Microalbumin w/creat. ratio - POCT HgB A1C is 6.9 which is improved from 7.0 last visit and will continue current medications  3. Essential hypertension Improved, continue current medications  4. Flu vaccine need - Influenza, MDCK, trivalent, PF(Flucelvax egg-free)  5. Obesity (BMI 30-39.9) Down 5lbs since last visit and encouraged to continue to work on this    General Counseling: Marvin verbalizes understanding of the findings of todays visit and agrees with plan of treatment. I have discussed any further diagnostic evaluation that may be needed or ordered today. We also reviewed his medications today. he has been encouraged to call the office with any questions or concerns that should arise related to todays visit.    Orders Placed This Encounter  Procedures   Influenza, MDCK, trivalent, PF(Flucelvax egg-free)   Urine Microalbumin w/creat. ratio   POCT HgB  A1C    No orders of the defined types were placed in this encounter.   Return in about 3 months (around 11/18/2024) for lab review.   Total time spent:35 Minutes Time spent includes review of chart, medications, test  results, and follow up plan with the patient.   [place] Controlled Substance Database was reviewed by me.  This patient was seen by Tinnie Pro, PA-C in collaboration with Dr. Sigrid Bathe as a part of collaborative care agreement.  Tinnie Pro, PA-C Internal medicine

## 2024-08-20 LAB — MICROALBUMIN / CREATININE URINE RATIO
Creatinine, Urine: 189.3 mg/dL
Microalb/Creat Ratio: 4 mg/g{creat} (ref 0–29)
Microalbumin, Urine: 7.4 ug/mL

## 2024-09-05 ENCOUNTER — Other Ambulatory Visit: Payer: Self-pay | Admitting: Physician Assistant

## 2024-09-05 DIAGNOSIS — I10 Essential (primary) hypertension: Secondary | ICD-10-CM

## 2024-09-07 NOTE — Progress Notes (Addendum)
 Labarron Sublette Jr.                                          MRN: [id number]   09/07/2024   The [facility] Quality Team Specialist reviewed this patient medical record for the purposes of chart review for care gap closure. The following were reviewed: chart review for care gap closure-colorectal cancer screening.Ahman Ferris Jr.                                          MRN: [id number]   09/21/2024   The [facility] Quality Team Specialist reviewed this patient medical record for the purposes of chart review for care gap closure. The following were reviewed: abstraction for care gap closure-colorectal cancer screening.    [facility] Quality Team

## 2024-09-09 DIAGNOSIS — H401132 Primary open-angle glaucoma, bilateral, moderate stage: Secondary | ICD-10-CM | POA: Diagnosis not present

## 2024-09-09 DIAGNOSIS — H2513 Age-related nuclear cataract, bilateral: Secondary | ICD-10-CM | POA: Diagnosis not present

## 2024-09-13 DIAGNOSIS — M79675 Pain in left toe(s): Secondary | ICD-10-CM | POA: Diagnosis not present

## 2024-09-13 DIAGNOSIS — M79674 Pain in right toe(s): Secondary | ICD-10-CM | POA: Diagnosis not present

## 2024-09-13 DIAGNOSIS — Z1212 Encounter for screening for malignant neoplasm of rectum: Secondary | ICD-10-CM | POA: Diagnosis not present

## 2024-09-13 DIAGNOSIS — Z1211 Encounter for screening for malignant neoplasm of colon: Secondary | ICD-10-CM | POA: Diagnosis not present

## 2024-09-13 DIAGNOSIS — B351 Tinea unguium: Secondary | ICD-10-CM | POA: Diagnosis not present

## 2024-09-17 LAB — COLOGUARD: COLOGUARD: NEGATIVE

## 2024-10-22 ENCOUNTER — Telehealth: Payer: Self-pay | Admitting: Physician Assistant

## 2024-10-22 NOTE — Telephone Encounter (Signed)
 Most recent controlling BP notes faxed to [facility] on 10/15/24; [phone]

## 2024-11-18 ENCOUNTER — Ambulatory Visit: Admitting: Physician Assistant

## 2024-11-30 ENCOUNTER — Encounter: Payer: Self-pay | Admitting: *Deleted

## 2024-11-30 NOTE — Progress Notes (Signed)
 Shaun Patrick.                                          MRN: [id number]   11/30/2024   The [facility] Quality Team Specialist reviewed this patient medical record for the purposes of chart review for care gap closure. The following were reviewed: chart review for care gap closure-kidney health evaluation for diabetes:eGFR  and uACR.    [facility]BCI Quality Team

## 2024-11-30 NOTE — Progress Notes (Signed)
 Shaun Patrick.                                          MRN: [id number]   11/30/2024   The [facility] Quality Team Specialist reviewed this patient medical record for the purposes of chart review for care gap closure. The following were reviewed: abstraction for care gap closure-glycemic status assessment.    VBCI Quality Team

## 2024-12-04 LAB — COMPREHENSIVE METABOLIC PANEL WITH GFR
ALT: 22 IU/L (ref 0–44)
AST: 17 IU/L (ref 0–40)
Albumin: 3.9 g/dL (ref 3.9–4.9)
Alkaline Phosphatase: 96 IU/L (ref 47–123)
BUN/Creatinine Ratio: 12 (ref 10–24)
BUN: 11 mg/dL (ref 8–27)
Bilirubin Total: 0.4 mg/dL (ref 0.0–1.2)
CO2: 22 mmol/L (ref 20–29)
Calcium: 9.6 mg/dL (ref 8.6–10.2)
Chloride: 104 mmol/L (ref 96–106)
Creatinine, Ser: 0.92 mg/dL (ref 0.76–1.27)
Globulin, Total: 3.2 g/dL (ref 1.5–4.5)
Glucose: 119 mg/dL — ABNORMAL HIGH (ref 70–99)
Potassium: 4.6 mmol/L (ref 3.5–5.2)
Sodium: 144 mmol/L (ref 134–144)
Total Protein: 7.1 g/dL (ref 6.0–8.5)
eGFR: 91 mL/min/1.73

## 2024-12-07 DIAGNOSIS — Z125 Encounter for screening for malignant neoplasm of prostate: Secondary | ICD-10-CM

## 2024-12-07 DIAGNOSIS — Z1329 Encounter for screening for other suspected endocrine disorder: Secondary | ICD-10-CM

## 2024-12-07 DIAGNOSIS — R5383 Other fatigue: Secondary | ICD-10-CM

## 2024-12-07 DIAGNOSIS — E782 Mixed hyperlipidemia: Secondary | ICD-10-CM

## 2024-12-07 DIAGNOSIS — E1165 Type 2 diabetes mellitus with hyperglycemia: Secondary | ICD-10-CM

## 2024-12-16 ENCOUNTER — Ambulatory Visit (INDEPENDENT_AMBULATORY_CARE_PROVIDER_SITE_OTHER): Admitting: Physician Assistant

## 2024-12-16 ENCOUNTER — Encounter: Payer: Self-pay | Admitting: Physician Assistant

## 2024-12-16 VITALS — BP 140/84 | HR 88 | Temp 98.0°F | Resp 16 | Ht 76.0 in | Wt 310.0 lb

## 2024-12-16 DIAGNOSIS — K219 Gastro-esophageal reflux disease without esophagitis: Secondary | ICD-10-CM | POA: Diagnosis not present

## 2024-12-16 DIAGNOSIS — J3089 Other allergic rhinitis: Secondary | ICD-10-CM | POA: Diagnosis not present

## 2024-12-16 DIAGNOSIS — M7989 Other specified soft tissue disorders: Secondary | ICD-10-CM

## 2024-12-16 DIAGNOSIS — I10 Essential (primary) hypertension: Secondary | ICD-10-CM | POA: Diagnosis not present

## 2024-12-16 DIAGNOSIS — J302 Other seasonal allergic rhinitis: Secondary | ICD-10-CM | POA: Diagnosis not present

## 2024-12-16 DIAGNOSIS — E785 Hyperlipidemia, unspecified: Secondary | ICD-10-CM

## 2024-12-16 MED ORDER — HYDROCHLOROTHIAZIDE 25 MG PO TABS: 25.0000 mg | ORAL_TABLET | Freq: Every day | ORAL | 3 refills | Status: AC

## 2024-12-16 MED ORDER — METOPROLOL SUCCINATE ER 25 MG PO TB24: 25.0000 mg | ORAL_TABLET | Freq: Every day | ORAL | 1 refills | Status: AC

## 2024-12-16 MED ORDER — MONTELUKAST SODIUM 10 MG PO TABS: 10.0000 mg | ORAL_TABLET | Freq: Every day | ORAL | 1 refills | Status: AC

## 2024-12-16 MED ORDER — ROSUVASTATIN CALCIUM 20 MG PO TABS: 20.0000 mg | ORAL_TABLET | Freq: Every day | ORAL | 1 refills | Status: AC

## 2024-12-16 MED ORDER — PANTOPRAZOLE SODIUM 40 MG PO TBEC: 40.0000 mg | DELAYED_RELEASE_TABLET | Freq: Every day | ORAL | 1 refills | Status: AC

## 2024-12-16 NOTE — Progress Notes (Signed)
 [facility] [address] [place], [place] [address]  Internal MEDICINE  Office Visit Note  Patient Name: Shaun Patrick  [id number]  [id number]  Date of Service: 12/16/2024  Chief Complaint  Patient presents with   Follow-up   Gastroesophageal Reflux   Hypertension    HPI Pt is here for routine -may not be taking hydrochlorothiazide  recently and will check at home as he did not bring this in to office with the rest of his meds and may account for high BP -146/88 on recheck -Only CMP drawn at lab and pt did not go back for re-draw. Will do so now -tolerating meds well  Current Medication: Outpatient Encounter Medications as of 12/16/2024  Medication Sig Note   Accu-Chek Softclix Lancets lancets Use as directed once a daily DX E11.65 01/21/2024: Uses spouse glucometer, lancets, test strips   aspirin  81 MG chewable tablet CHEW 1 TABLET DAILY    Blood Glucose Monitoring Suppl (ACCU-CHEK GUIDE ME) w/Device KIT USE AS DIRECTED TO CHECK GLUCOSE dxE11.65 01/21/2024: Uses spouse glucometer, lancets, test strips   clobetasol  ointment (TEMOVATE ) 0.05 % Apply 1 application topically 2 (two) times daily.    glucose blood (ACCU-CHEK GUIDE) test strip 1 each by Other route as needed for other. Use as instructed once daily DX E11.65 01/21/2024: Uses spouse glucometer, lancets, test strips   hydrALAZINE  (APRESOLINE ) 25 MG tablet TAKE 1 TABLET BY MOUTH THREE TIMES A DAY    latanoprost (XALATAN) 0.005 % ophthalmic solution 1 drop at bedtime.    metFORMIN  (GLUCOPHAGE -XR) 500 MG 24 hr tablet TAKE 1 TABLET BY MOUTH EVERY DAY WITH BREAKFAST    Multiple Vitamin (MULTI-VITAMINS) TABS Take 1 tablet by mouth 1 day or 1 dose. 05/16/2015: Received from: [facility] Received Sig: Take by mouth.   sildenafil  (VIAGRA ) 100 MG tablet Take 0.5-1 tablets (50-100 mg total) by mouth daily as needed for erectile dysfunction.    [DISCONTINUED] hydrochlorothiazide  (HYDRODIURIL ) 25 MG tablet  Take 1 tablet (25 mg total) by mouth daily.    [DISCONTINUED] metoprolol  succinate (TOPROL -XL) 25 MG 24 hr tablet TAKE 1 TABLET (25 MG TOTAL) BY MOUTH DAILY.    [DISCONTINUED] montelukast  (SINGULAIR ) 10 MG tablet TAKE 1 TABLET BY MOUTH EVERY DAY    [DISCONTINUED] pantoprazole  (PROTONIX ) 40 MG tablet TAKE 1 TABLET BY MOUTH EVERY DAY    [DISCONTINUED] rosuvastatin  (CRESTOR ) 20 MG tablet Take 1 tablet (20 mg total) by mouth daily.    hydrochlorothiazide  (HYDRODIURIL ) 25 MG tablet Take 1 tablet (25 mg total) by mouth daily.    metoprolol  succinate (TOPROL -XL) 25 MG 24 hr tablet Take 1 tablet (25 mg total) by mouth daily.    montelukast  (SINGULAIR ) 10 MG tablet Take 1 tablet (10 mg total) by mouth daily.    pantoprazole  (PROTONIX ) 40 MG tablet Take 1 tablet (40 mg total) by mouth daily.    rosuvastatin  (CRESTOR ) 20 MG tablet Take 1 tablet (20 mg total) by mouth daily.    No facility-administered encounter medications on file as of 12/16/2024.    Surgical History: Past Surgical History:  Procedure Laterality Date   HERNIA REPAIR     umbilical   JOINT REPLACEMENT Bilateral    knees   KNEE SURGERY Bilateral    knee replacements   NASAL SEPTOPLASTY W/ TURBINOPLASTY Bilateral 10/17/2015   Procedure: NASAL SEPTOPLASTY WITH TURBINATE REDUCTION;  Surgeon: Chinita Hasten, MD;  Location: [facility] ORS;  Service: ENT;  Laterality: Bilateral;   NECK SURGERY  1979   broke neck  TONSILECTOMY, ADENOIDECTOMY, BILATERAL MYRINGOTOMY AND TUBES     TONSILLECTOMY      Medical History: Past Medical History:  Diagnosis Date   GERD (gastroesophageal reflux disease)    Hypertension     Family History: Family History  Problem Relation Age of Onset   Stroke Mother    Heart disease Mother    Hypertension Mother    Liver disease Father    Hypertension Sister     Social History   Socioeconomic History   Marital status: Married    Spouse name: Not on file   Number of children: Not on file   Years of  education: Not on file   Highest education level: Not on file  Occupational History   Occupation: med lab techinician     Employer: [employer]  Tobacco Use   Smoking status: Former    Current packs/day: 0.00    Types: Cigarettes    Start date: 12/03/1983    Quit date: 12/02/1993    Years since quitting: 31.0   Smokeless tobacco: Never  Substance and Sexual Activity   Alcohol use: Yes    Alcohol/week: 4.0 standard drinks of alcohol    Types: 4 Cans of beer per week   Drug use: No   Sexual activity: Never  Other Topics Concern   Not on file  Social History Narrative   Not on file   Social Drivers of Health   Tobacco Use: Medium Risk (12/16/2024)   Patient History    Smoking Tobacco Use: Former    Smokeless Tobacco Use: Never    Passive Exposure: Not on file  Financial Resource Strain: Low Risk  (11/12/2023)   Received from [facility]   Overall Financial Resource Strain (CARDIA)    Difficulty of Paying Living Expenses: Not hard at all  Food Insecurity: No Food Insecurity (11/12/2023)   Received from [facility]   Epic    Within the past 12 months, you worried that your food would run out before you got the money to buy more.: Never true    Within the past 12 months, the food you bought just didn't last and you didn't have money to get more.: Never true  Transportation Needs: No Transportation Needs (11/12/2023)   Received from [facility] - Transportation    In the past 12 months, has lack of transportation kept you from medical appointments or from getting medications?: No    Lack of Transportation (Non-Medical): No  Physical Activity: Not on file  Stress: Not on file  Social Connections: Not on file  Intimate Partner Violence: Not on file  Depression (PHQ2-9): Low Risk (12/16/2024)   Depression (PHQ2-9)    PHQ-2 Score: 0  Alcohol Screen: Low Risk (08/19/2024)   Alcohol Screen    Last  Alcohol Screening Score (AUDIT): 3  Housing: Low Risk  (04/14/2024)   Received from [facility]   Epic    In the last 12 months, was there a time when you were not able to pay the mortgage or rent on time?: No    In the past 12 months, how many times have you moved where you were living?: 0    At any time in the past 12 months, were you homeless or living in a shelter (including now)?: No  Utilities: Not At Risk (11/12/2023)   Received from [facility] Utilities    Threatened with loss  of utilities: No  Health Literacy: Not on file      Review of Systems  Constitutional:  Negative for chills, fatigue and unexpected weight change.  HENT:  Negative for congestion, rhinorrhea, sneezing and sore throat.   Eyes:  Negative for redness.  Respiratory:  Negative for cough, chest tightness and shortness of breath.   Cardiovascular:  Negative for chest pain and palpitations.  Gastrointestinal:  Negative for abdominal pain, constipation, diarrhea, nausea and vomiting.  Genitourinary:  Negative for dysuria and frequency.  Musculoskeletal:  Positive for arthralgias. Negative for back pain, joint swelling and neck pain.  Skin:  Negative for rash.  Neurological: Negative.  Negative for tremors and numbness.  Hematological:  Negative for adenopathy. Does not bruise/bleed easily.  Psychiatric/Behavioral:  Negative for behavioral problems (Depression), sleep disturbance and suicidal ideas. The patient is not nervous/anxious.     Vital Signs: BP (!) 140/84   Pulse 88   Temp 98 F (36.7 C)   Resp 16   Ht 6' 4 (1.93 m)   Wt (!) 310 lb (140.6 kg)   SpO2 97%   BMI 37.73 kg/m    Physical Exam Vitals and nursing note reviewed.  Constitutional:      General: He is not in acute distress.    Appearance: Normal appearance. He is well-developed. He is obese.  HENT:     Head: Normocephalic and atraumatic.  Eyes:     Pupils: Pupils are equal, round, and  reactive to light.  Neck:     Thyroid : No thyromegaly.     Vascular: No JVD.     Trachea: No tracheal deviation.  Cardiovascular:     Rate and Rhythm: Normal rate and regular rhythm.     Heart sounds: Normal heart sounds.     No friction rub.  Pulmonary:     Effort: Pulmonary effort is normal.  Skin:    General: Skin is warm and dry.  Neurological:     Mental Status: He is alert and oriented to person, place, and time.  Psychiatric:        Behavior: Behavior normal.        Thought Content: Thought content normal.        Judgment: Judgment normal.        Assessment/Plan: 1. Essential hypertension (Primary) Elevated in office and may not have taken hydrochlorothiazide . Will reorder and patient will monitor BP at home - metoprolol  succinate (TOPROL -XL) 25 MG 24 hr tablet; Take 1 tablet (25 mg total) by mouth daily.  Dispense: 90 tablet; Refill: 1 - hydrochlorothiazide  (HYDRODIURIL ) 25 MG tablet; Take 1 tablet (25 mg total) by mouth daily.  Dispense: 90 tablet; Refill: 3  2. Localized swelling of both lower extremities - hydrochlorothiazide  (HYDRODIURIL ) 25 MG tablet; Take 1 tablet (25 mg total) by mouth daily.  Dispense: 90 tablet; Refill: 3  3. Gastroesophageal reflux disease without esophagitis - pantoprazole  (PROTONIX ) 40 MG tablet; Take 1 tablet (40 mg total) by mouth daily.  Dispense: 90 tablet; Refill: 1  4. Perennial allergic rhinitis with seasonal variation - montelukast  (SINGULAIR ) 10 MG tablet; Take 1 tablet (10 mg total) by mouth daily.  Dispense: 90 tablet; Refill: 1  5. Dyslipidemia Will have labs done for monitoring - rosuvastatin  (CRESTOR ) 20 MG tablet; Take 1 tablet (20 mg total) by mouth daily.  Dispense: 90 tablet; Refill: 1   General Counseling: Shaun Patrick verbalizes understanding of the findings of todays visit and agrees with plan of treatment. I have discussed any further diagnostic  evaluation that may be needed or ordered today. We also reviewed his  medications today. he has been encouraged to call the office with any questions or concerns that should arise related to todays visit.    No orders of the defined types were placed in this encounter.   Meds ordered this encounter  Medications   pantoprazole  (PROTONIX ) 40 MG tablet    Sig: Take 1 tablet (40 mg total) by mouth daily.    Dispense:  90 tablet    Refill:  1   montelukast  (SINGULAIR ) 10 MG tablet    Sig: Take 1 tablet (10 mg total) by mouth daily.    Dispense:  90 tablet    Refill:  1   metoprolol  succinate (TOPROL -XL) 25 MG 24 hr tablet    Sig: Take 1 tablet (25 mg total) by mouth daily.    Dispense:  90 tablet    Refill:  1   rosuvastatin  (CRESTOR ) 20 MG tablet    Sig: Take 1 tablet (20 mg total) by mouth daily.    Dispense:  90 tablet    Refill:  1   hydrochlorothiazide  (HYDRODIURIL ) 25 MG tablet    Sig: Take 1 tablet (25 mg total) by mouth daily.    Dispense:  90 tablet    Refill:  3    This patient was seen by Tinnie Pro, PA-C in collaboration with Dr. Sigrid Bathe as a part of collaborative care agreement.   Total time spent:30 Minutes Time spent includes review of chart, medications, test results, and follow up plan with the patient.      Dr Fozia M Khan Internal medicine

## 2025-01-13 ENCOUNTER — Ambulatory Visit: Admitting: Physician Assistant

## 2025-08-22 ENCOUNTER — Ambulatory Visit: Admitting: Physician Assistant
